# Patient Record
Sex: Female | Born: 1935 | Race: White | Hispanic: No | Smoking: Former smoker
Health system: Southern US, Community
[De-identification: ages and names within clinical notes are randomized; demographics above are authoritative.]

## PROBLEM LIST (undated history)

## (undated) DIAGNOSIS — Z7901 Long term (current) use of anticoagulants: Secondary | ICD-10-CM

## (undated) DIAGNOSIS — K5730 Diverticulosis of large intestine without perforation or abscess without bleeding: Secondary | ICD-10-CM

## (undated) DIAGNOSIS — K623 Rectal prolapse: Secondary | ICD-10-CM

## (undated) DIAGNOSIS — I4891 Unspecified atrial fibrillation: Secondary | ICD-10-CM

## (undated) DIAGNOSIS — K56609 Unspecified intestinal obstruction, unspecified as to partial versus complete obstruction: Secondary | ICD-10-CM

## (undated) DIAGNOSIS — G8929 Other chronic pain: Secondary | ICD-10-CM

## (undated) DIAGNOSIS — K5792 Diverticulitis of intestine, part unspecified, without perforation or abscess without bleeding: Secondary | ICD-10-CM

## (undated) DIAGNOSIS — R51 Headache: Secondary | ICD-10-CM

## (undated) DIAGNOSIS — F32A Depression, unspecified: Secondary | ICD-10-CM

## (undated) DIAGNOSIS — D500 Iron deficiency anemia secondary to blood loss (chronic): Secondary | ICD-10-CM

## (undated) DIAGNOSIS — K922 Gastrointestinal hemorrhage, unspecified: Secondary | ICD-10-CM

## (undated) DIAGNOSIS — R519 Headache, unspecified: Secondary | ICD-10-CM

## (undated) DIAGNOSIS — F329 Major depressive disorder, single episode, unspecified: Secondary | ICD-10-CM

## (undated) DIAGNOSIS — K8020 Calculus of gallbladder without cholecystitis without obstruction: Secondary | ICD-10-CM

## (undated) DIAGNOSIS — F419 Anxiety disorder, unspecified: Secondary | ICD-10-CM

## (undated) DIAGNOSIS — N390 Urinary tract infection, site not specified: Secondary | ICD-10-CM

## (undated) DIAGNOSIS — K589 Irritable bowel syndrome without diarrhea: Secondary | ICD-10-CM

## (undated) HISTORY — PX: ABDOMINAL HYSTERECTOMY: SHX81

## (undated) HISTORY — DX: Headache: R51

## (undated) HISTORY — DX: Calculus of gallbladder without cholecystitis without obstruction: K80.20

## (undated) HISTORY — DX: Major depressive disorder, single episode, unspecified: F32.9

## (undated) HISTORY — DX: Headache, unspecified: R51.9

## (undated) HISTORY — DX: Irritable bowel syndrome, unspecified: K58.9

## (undated) HISTORY — DX: Depression, unspecified: F32.A

## (undated) HISTORY — DX: Other chronic pain: G89.29

## (undated) HISTORY — PX: APPENDECTOMY: SHX54

## (undated) HISTORY — PX: OTHER SURGICAL HISTORY: SHX169

## (undated) HISTORY — DX: Unspecified intestinal obstruction, unspecified as to partial versus complete obstruction: K56.609

## (undated) HISTORY — DX: Urinary tract infection, site not specified: N39.0

## (undated) HISTORY — DX: Anxiety disorder, unspecified: F41.9

## (undated) HISTORY — PX: SMALL INTESTINE SURGERY: SHX150

---

## 1998-07-10 ENCOUNTER — Encounter: Payer: Self-pay | Admitting: Internal Medicine

## 1998-07-10 ENCOUNTER — Ambulatory Visit (HOSPITAL_COMMUNITY): Admission: RE | Admit: 1998-07-10 | Discharge: 1998-07-10 | Payer: Self-pay | Admitting: Internal Medicine

## 1998-09-30 ENCOUNTER — Other Ambulatory Visit: Admission: RE | Admit: 1998-09-30 | Discharge: 1998-09-30 | Payer: Self-pay | Admitting: *Deleted

## 1999-10-01 ENCOUNTER — Other Ambulatory Visit: Admission: RE | Admit: 1999-10-01 | Discharge: 1999-10-01 | Payer: Self-pay | Admitting: *Deleted

## 2000-02-17 ENCOUNTER — Encounter: Admission: RE | Admit: 2000-02-17 | Discharge: 2000-02-17 | Payer: Self-pay | Admitting: *Deleted

## 2000-02-17 ENCOUNTER — Encounter: Payer: Self-pay | Admitting: *Deleted

## 2000-02-24 ENCOUNTER — Encounter: Admission: RE | Admit: 2000-02-24 | Discharge: 2000-02-24 | Payer: Self-pay | Admitting: *Deleted

## 2000-02-24 ENCOUNTER — Encounter: Payer: Self-pay | Admitting: *Deleted

## 2000-10-06 ENCOUNTER — Other Ambulatory Visit: Admission: RE | Admit: 2000-10-06 | Discharge: 2000-10-06 | Payer: Self-pay | Admitting: *Deleted

## 2000-11-27 ENCOUNTER — Emergency Department (HOSPITAL_COMMUNITY): Admission: EM | Admit: 2000-11-27 | Discharge: 2000-11-27 | Payer: Self-pay | Admitting: Emergency Medicine

## 2000-11-27 ENCOUNTER — Encounter: Payer: Self-pay | Admitting: Emergency Medicine

## 2000-12-14 ENCOUNTER — Encounter: Payer: Self-pay | Admitting: General Surgery

## 2000-12-21 ENCOUNTER — Encounter (INDEPENDENT_AMBULATORY_CARE_PROVIDER_SITE_OTHER): Payer: Self-pay

## 2000-12-21 ENCOUNTER — Observation Stay (HOSPITAL_COMMUNITY): Admission: RE | Admit: 2000-12-21 | Discharge: 2000-12-22 | Payer: Self-pay | Admitting: General Surgery

## 2001-06-14 ENCOUNTER — Ambulatory Visit (HOSPITAL_COMMUNITY): Admission: RE | Admit: 2001-06-14 | Discharge: 2001-06-14 | Payer: Self-pay | Admitting: *Deleted

## 2001-09-24 ENCOUNTER — Encounter: Payer: Self-pay | Admitting: Emergency Medicine

## 2001-09-24 ENCOUNTER — Emergency Department (HOSPITAL_COMMUNITY): Admission: EM | Admit: 2001-09-24 | Discharge: 2001-09-24 | Payer: Self-pay | Admitting: Emergency Medicine

## 2002-03-05 ENCOUNTER — Emergency Department (HOSPITAL_COMMUNITY): Admission: EM | Admit: 2002-03-05 | Discharge: 2002-03-05 | Payer: Self-pay | Admitting: Orthopedic Surgery

## 2002-12-19 ENCOUNTER — Encounter: Admission: RE | Admit: 2002-12-19 | Discharge: 2002-12-19 | Payer: Self-pay | Admitting: Internal Medicine

## 2002-12-19 ENCOUNTER — Encounter: Payer: Self-pay | Admitting: Internal Medicine

## 2004-09-26 ENCOUNTER — Emergency Department (HOSPITAL_COMMUNITY): Admission: EM | Admit: 2004-09-26 | Discharge: 2004-09-26 | Payer: Self-pay | Admitting: Emergency Medicine

## 2005-12-12 ENCOUNTER — Emergency Department (HOSPITAL_COMMUNITY): Admission: EM | Admit: 2005-12-12 | Discharge: 2005-12-12 | Payer: Self-pay | Admitting: Emergency Medicine

## 2006-12-20 ENCOUNTER — Emergency Department (HOSPITAL_COMMUNITY): Admission: EM | Admit: 2006-12-20 | Discharge: 2006-12-20 | Payer: Self-pay | Admitting: Emergency Medicine

## 2007-04-17 ENCOUNTER — Emergency Department (HOSPITAL_COMMUNITY): Admission: EM | Admit: 2007-04-17 | Discharge: 2007-04-17 | Payer: Self-pay | Admitting: Emergency Medicine

## 2008-11-09 ENCOUNTER — Inpatient Hospital Stay (HOSPITAL_COMMUNITY): Admission: AD | Admit: 2008-11-09 | Discharge: 2008-11-12 | Payer: Self-pay | Admitting: Internal Medicine

## 2008-11-09 ENCOUNTER — Encounter: Admission: RE | Admit: 2008-11-09 | Discharge: 2008-11-09 | Payer: Self-pay | Admitting: Internal Medicine

## 2008-11-12 ENCOUNTER — Encounter (INDEPENDENT_AMBULATORY_CARE_PROVIDER_SITE_OTHER): Payer: Self-pay | Admitting: *Deleted

## 2008-12-20 ENCOUNTER — Encounter: Admission: RE | Admit: 2008-12-20 | Discharge: 2008-12-20 | Payer: Self-pay | Admitting: Surgery

## 2009-01-24 ENCOUNTER — Encounter: Admission: RE | Admit: 2009-01-24 | Discharge: 2009-01-24 | Payer: Self-pay | Admitting: Internal Medicine

## 2009-03-29 ENCOUNTER — Encounter (INDEPENDENT_AMBULATORY_CARE_PROVIDER_SITE_OTHER): Payer: Self-pay | Admitting: Surgery

## 2009-03-29 ENCOUNTER — Inpatient Hospital Stay (HOSPITAL_COMMUNITY): Admission: RE | Admit: 2009-03-29 | Discharge: 2009-04-09 | Payer: Self-pay | Admitting: Surgery

## 2009-04-01 ENCOUNTER — Encounter (INDEPENDENT_AMBULATORY_CARE_PROVIDER_SITE_OTHER): Payer: Self-pay | Admitting: Internal Medicine

## 2009-05-05 ENCOUNTER — Inpatient Hospital Stay (HOSPITAL_COMMUNITY): Admission: EM | Admit: 2009-05-05 | Discharge: 2009-05-13 | Payer: Self-pay | Admitting: Emergency Medicine

## 2009-06-03 ENCOUNTER — Inpatient Hospital Stay (HOSPITAL_COMMUNITY): Admission: EM | Admit: 2009-06-03 | Discharge: 2009-06-06 | Payer: Self-pay | Admitting: Emergency Medicine

## 2009-06-06 ENCOUNTER — Ambulatory Visit: Payer: Self-pay | Admitting: Internal Medicine

## 2009-11-09 ENCOUNTER — Emergency Department (HOSPITAL_COMMUNITY): Admission: EM | Admit: 2009-11-09 | Discharge: 2009-11-09 | Payer: Self-pay | Admitting: Family Medicine

## 2009-11-11 ENCOUNTER — Inpatient Hospital Stay (HOSPITAL_COMMUNITY): Admission: EM | Admit: 2009-11-11 | Discharge: 2009-11-15 | Payer: Self-pay | Admitting: Emergency Medicine

## 2010-02-21 IMAGING — CR DG ABDOMEN ACUTE W/ 1V CHEST
3 series · 3 of 3 positions shown · non-contrast
Comparison: 05/09/2009 and chest film of 05/12/2009

CLINICAL DATA: Nausea vomiting.  Abdominal pain.  Partial
colectomy.

ACUTE ABDOMEN SERIES (ABDOMEN 2 VIEW & CHEST 1 VIEW)

[w chest pa]
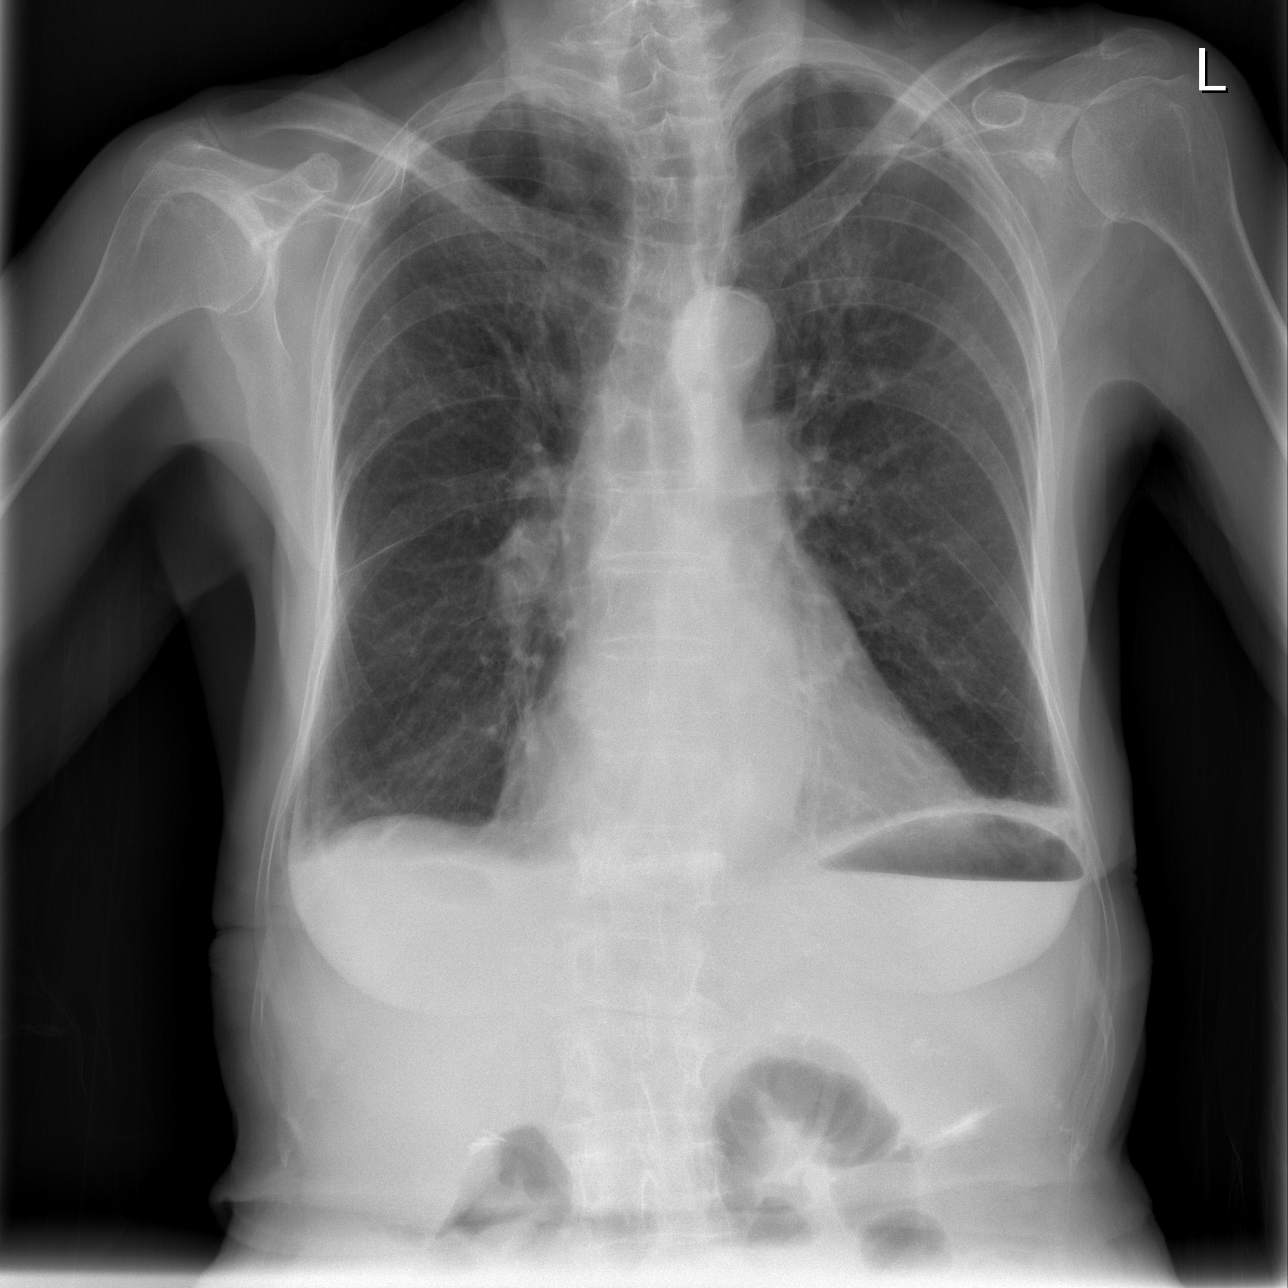

[w abdomen upright *]
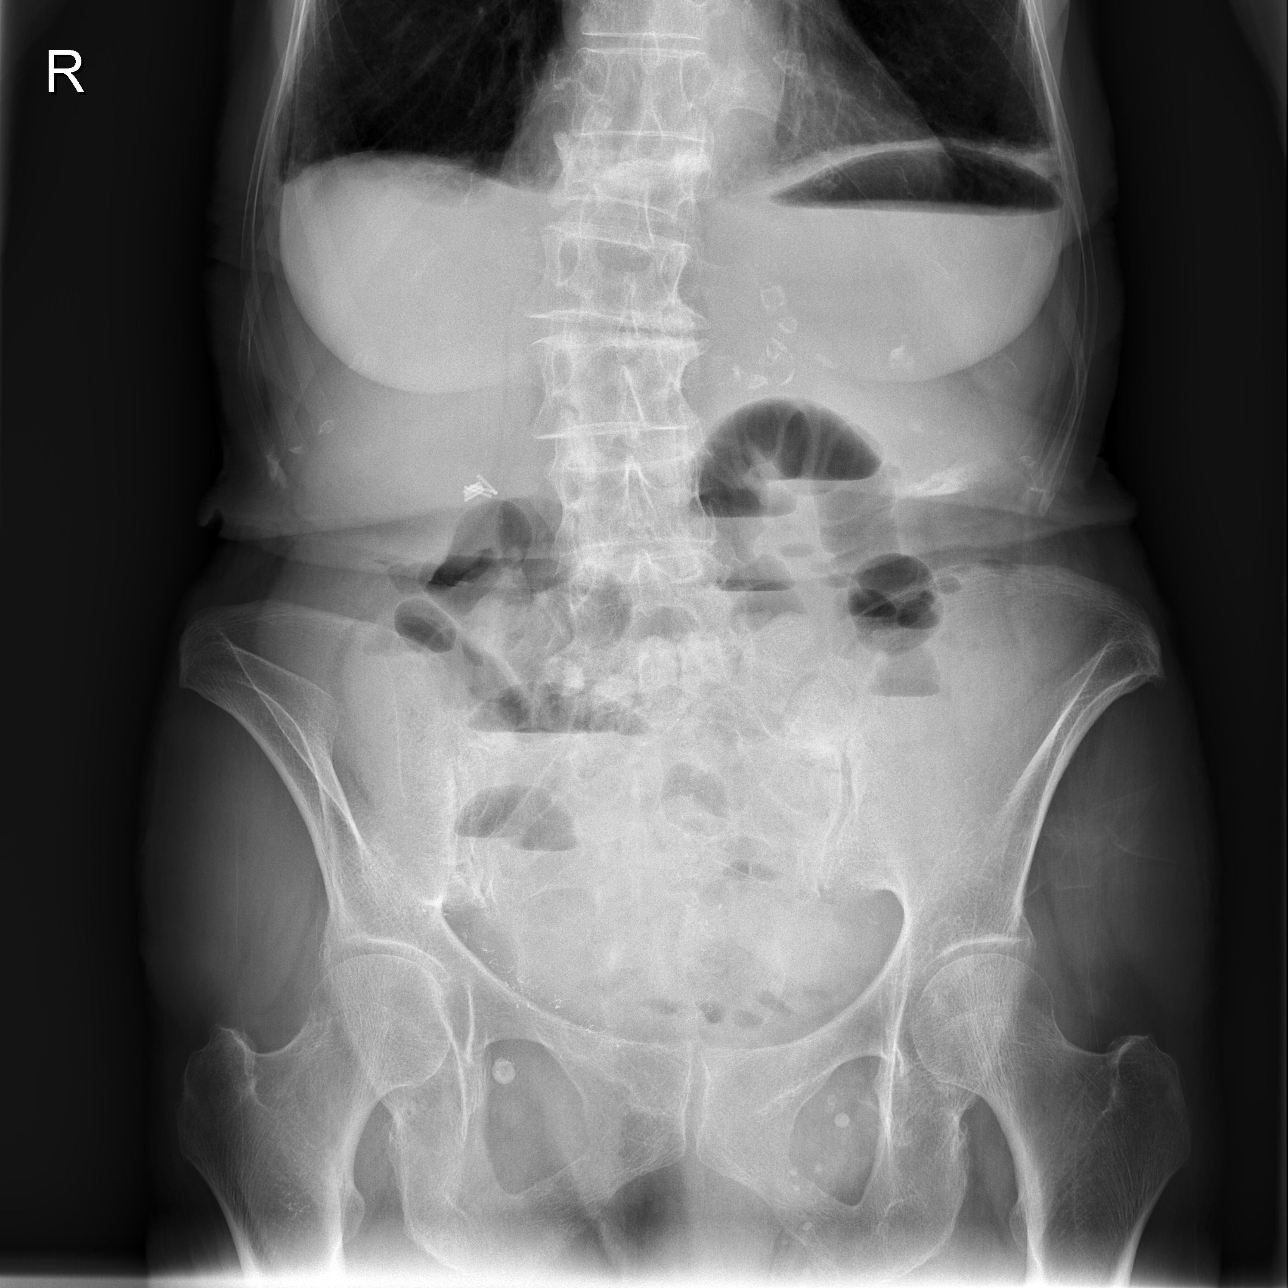

[t abdomen supine]
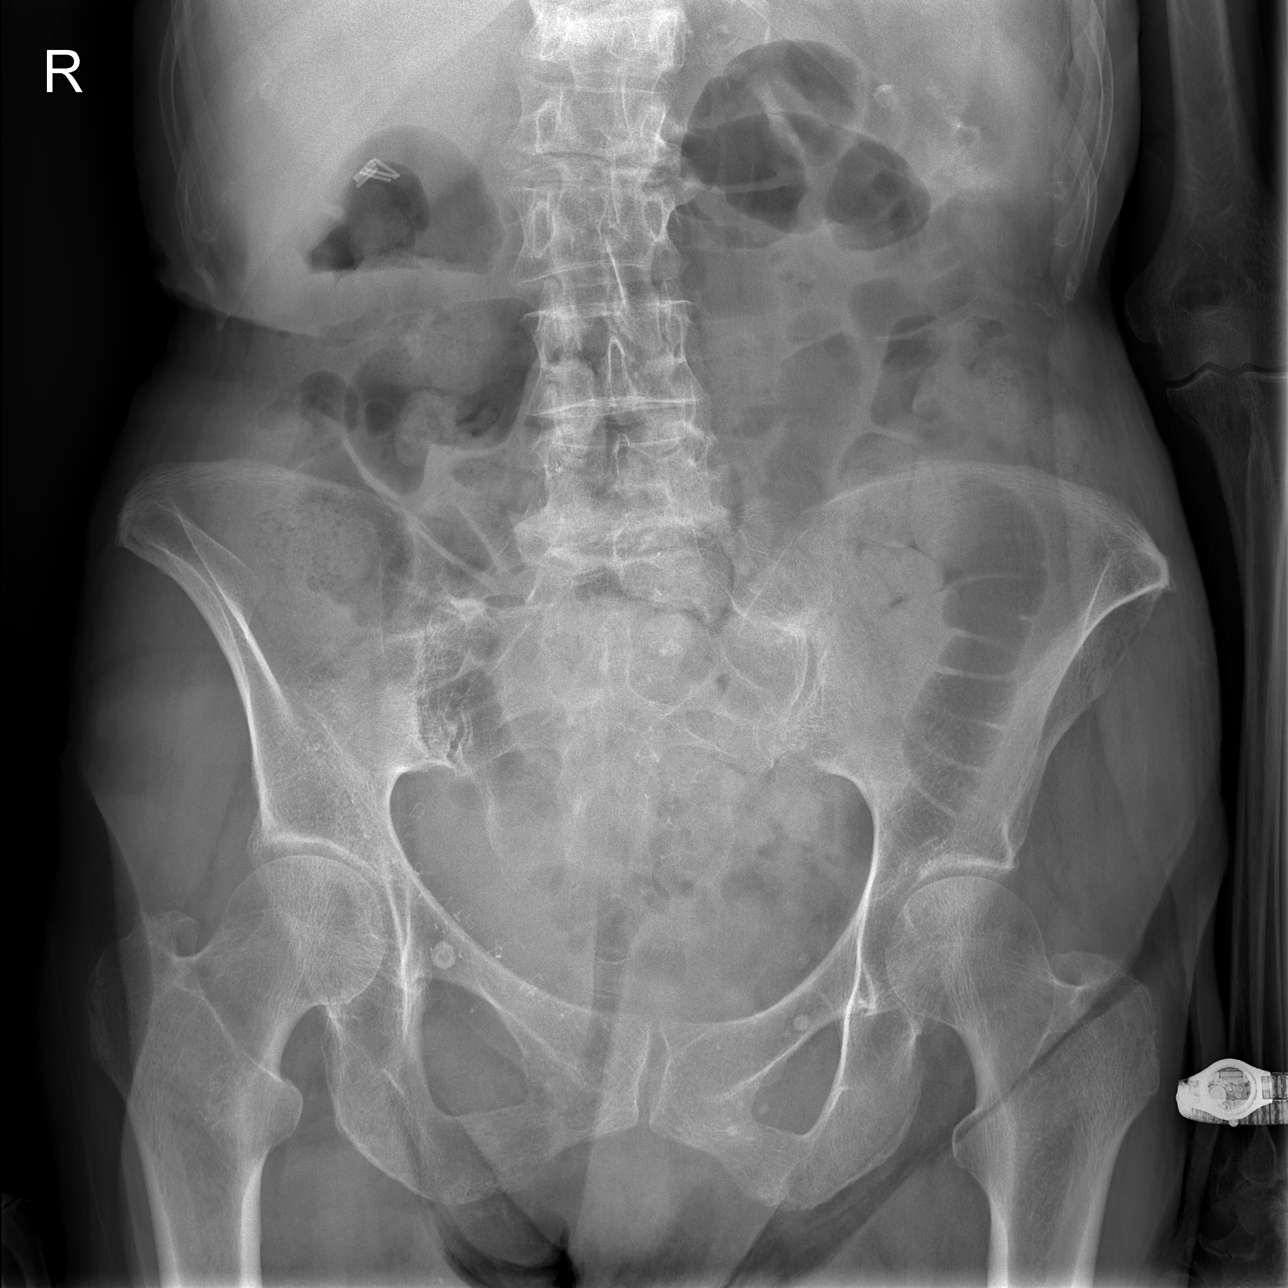

[3 of 3 positions shown; findings below may reference images not displayed]

FINDINGS: Frontal view of the chest demonstrates underlying COPD.
Moderate osteopenia. Midline trachea.  Mild cardiomegaly.  Small
bilateral pleural effusions which are decreased.  Biapical pleural
thickening without pneumothorax.  Improved aeration with mild
bibasilar atelectasis.  S-shaped spinal curvature.  Possible L1
compression deformity.  Suboptimally evaluated.

Abominal films demonstrate no free intraperitoneal air.  Advanced
vascular calcifications.  Numerous air-fluid levels within small
bowel loops.  Small bowel mildly dilated at approximately 3.5 cm.
Colonic gas and stool.  Probably a small amount of sigmoid gas.
IMPRESSION: 1.  Findings again suspicious for mild partial small bowel
obstruction.  Similar to 05/09/2009.
2.  Improved aeration with underlying COPD and small bilateral
pleural effusions remaining.
3.  Probable L1 compression deformity.

## 2010-10-22 LAB — POCT URINALYSIS DIP (DEVICE)
Ketones, ur: NEGATIVE mg/dL
Protein, ur: 100 mg/dL — AB
Specific Gravity, Urine: 1.015 (ref 1.005–1.030)
Urobilinogen, UA: 1 mg/dL (ref 0.0–1.0)
pH: 6 (ref 5.0–8.0)

## 2010-10-22 LAB — BASIC METABOLIC PANEL
BUN: 14 mg/dL (ref 6–23)
BUN: 16 mg/dL (ref 6–23)
CO2: 23 mEq/L (ref 19–32)
CO2: 24 mEq/L (ref 19–32)
CO2: 25 mEq/L (ref 19–32)
CO2: 27 mEq/L (ref 19–32)
Calcium: 8.2 mg/dL — ABNORMAL LOW (ref 8.4–10.5)
Chloride: 109 mEq/L (ref 96–112)
Chloride: 110 mEq/L (ref 96–112)
Creatinine, Ser: 0.6 mg/dL (ref 0.4–1.2)
GFR calc non Af Amer: >60 mL/min (ref >60)
Glucose, Bld: 100 mg/dL — ABNORMAL HIGH (ref 70–99)
Glucose, Bld: 120 mg/dL — ABNORMAL HIGH (ref 70–99)
Glucose, Bld: 68 mg/dL — ABNORMAL LOW (ref 70–99)
Glucose, Bld: 89 mg/dL (ref 70–99)
Potassium: 3.7 mEq/L (ref 3.5–5.1)
Potassium: 3.7 mEq/L (ref 3.5–5.1)
Potassium: 3.8 mEq/L (ref 3.5–5.1)
Sodium: 136 mEq/L (ref 135–145)
Sodium: 140 mEq/L (ref 135–145)

## 2010-10-22 LAB — DIFFERENTIAL
Basophils Absolute: 0 10*3/uL (ref 0.0–0.1)
Basophils Relative: 0 % (ref 0–1)
Basophils Relative: 0 % (ref 0–1)
Eosinophils Relative: 0 % (ref 0–5)
Lymphocytes Relative: 11 % — ABNORMAL LOW (ref 12–46)
Monocytes Absolute: 1 10*3/uL (ref 0.1–1.0)
Monocytes Relative: 6 % (ref 3–12)
Neutro Abs: 8.8 10*3/uL — ABNORMAL HIGH (ref 1.7–7.7)
Neutrophils Relative %: 92 % — ABNORMAL HIGH (ref 43–77)

## 2010-10-22 LAB — CBC
HCT: 30.5 % — ABNORMAL LOW (ref 36.0–46.0)
HCT: 31.3 % — ABNORMAL LOW (ref 36.0–46.0)
HCT: 32 % — ABNORMAL LOW (ref 36.0–46.0)
HCT: 32.9 % — ABNORMAL LOW (ref 36.0–46.0)
HCT: 34.2 % — ABNORMAL LOW (ref 36.0–46.0)
Hemoglobin: 10.3 g/dL — ABNORMAL LOW (ref 12.0–15.0)
Hemoglobin: 10.3 g/dL — ABNORMAL LOW (ref 12.0–15.0)
Hemoglobin: 11.1 g/dL — ABNORMAL LOW (ref 12.0–15.0)
Hemoglobin: 11.3 g/dL — ABNORMAL LOW (ref 12.0–15.0)
MCHC: 33.1 g/dL (ref 30.0–36.0)
MCHC: 33.3 g/dL (ref 30.0–36.0)
MCHC: 33.6 g/dL (ref 30.0–36.0)
MCHC: 33.7 g/dL (ref 30.0–36.0)
MCV: 95.3 fL (ref 78.0–100.0)
MCV: 95.9 fL (ref 78.0–100.0)
MCV: 96.2 fL (ref 78.0–100.0)
MCV: 96.8 fL (ref 78.0–100.0)
Platelets: 210 10*3/uL (ref 150–400)
Platelets: 231 10*3/uL (ref 150–400)
Platelets: 306 10*3/uL (ref 150–400)
RDW: 14.8 % (ref 11.5–15.5)
RDW: 14.9 % (ref 11.5–15.5)
RDW: 15.1 % (ref 11.5–15.5)
RDW: 15.3 % (ref 11.5–15.5)
RDW: 15.4 % (ref 11.5–15.5)
WBC: 4.9 10*3/uL (ref 4.0–10.5)

## 2010-10-22 LAB — COMPREHENSIVE METABOLIC PANEL
Alkaline Phosphatase: 74 U/L (ref 39–117)
BUN: 22 mg/dL (ref 6–23)
Calcium: 8.3 mg/dL — ABNORMAL LOW (ref 8.4–10.5)
Creatinine, Ser: 0.75 mg/dL (ref 0.4–1.2)
Glucose, Bld: 150 mg/dL — ABNORMAL HIGH (ref 70–99)
Potassium: 3.9 mEq/L (ref 3.5–5.1)
Total Protein: 6.7 g/dL (ref 6.0–8.3)

## 2010-10-22 LAB — HEMOGLOBIN A1C
Hgb A1c MFr Bld: 6.4 % — ABNORMAL HIGH (ref 4.6–6.1)
Mean Plasma Glucose: 137 mg/dL

## 2010-10-22 LAB — URINALYSIS, ROUTINE W REFLEX MICROSCOPIC
Glucose, UA: NEGATIVE mg/dL
Protein, ur: 30 mg/dL — AB
Urobilinogen, UA: 1 mg/dL (ref 0.0–1.0)

## 2010-10-22 LAB — RETICULOCYTES: Retic Count, Absolute: 43.7 10*3/uL (ref 19.0–186.0)

## 2010-10-22 LAB — FOLATE: Folate: >20 ng/mL

## 2010-10-22 LAB — IRON AND TIBC: UIBC: 224 ug/dL

## 2010-10-22 LAB — PROTIME-INR
INR: 2.5 — ABNORMAL HIGH (ref 0.00–1.49)
INR: 2.7 — ABNORMAL HIGH (ref 0.00–1.49)
Prothrombin Time: 26.8 seconds — ABNORMAL HIGH (ref 11.6–15.2)

## 2010-10-22 LAB — CULTURE, BLOOD (ROUTINE X 2): Culture: NO GROWTH

## 2010-10-22 LAB — POCT I-STAT, CHEM 8
Chloride: 103 mEq/L (ref 96–112)
HCT: 38 % (ref 36.0–46.0)
Hemoglobin: 12.9 g/dL (ref 12.0–15.0)
Potassium: 4.4 mEq/L (ref 3.5–5.1)
Sodium: 134 mEq/L — ABNORMAL LOW (ref 135–145)

## 2010-10-22 LAB — URINE CULTURE

## 2010-10-22 LAB — LACTIC ACID, PLASMA: Lactic Acid, Venous: 0.6 mmol/L (ref 0.5–2.2)

## 2010-10-22 LAB — URINE MICROSCOPIC-ADD ON

## 2010-11-05 LAB — BASIC METABOLIC PANEL
BUN: 6 mg/dL (ref 6–23)
CO2: 29 mEq/L (ref 19–32)
Calcium: 8.3 mg/dL — ABNORMAL LOW (ref 8.4–10.5)
Chloride: 102 mEq/L (ref 96–112)
Creatinine, Ser: 0.6 mg/dL (ref 0.4–1.2)
Glucose, Bld: 95 mg/dL (ref 70–99)

## 2010-11-05 LAB — CBC
MCHC: 33.9 g/dL (ref 30.0–36.0)
MCV: 95.8 fL (ref 78.0–100.0)
Platelets: 207 10*3/uL (ref 150–400)
RBC: 3.37 MIL/uL — ABNORMAL LOW (ref 3.87–5.11)
RDW: 14.2 % (ref 11.5–15.5)

## 2010-11-05 LAB — URINE CULTURE
Colony Count: >=100000
Special Requests: NEGATIVE

## 2010-11-06 LAB — COMPREHENSIVE METABOLIC PANEL
ALT: 20 U/L (ref 0–35)
AST: 23 U/L (ref 0–37)
AST: 20 U/L (ref 0–37)
Albumin: 3.7 g/dL (ref 3.5–5.2)
Albumin: 3.2 g/dL — ABNORMAL LOW (ref 3.5–5.2)
Alkaline Phosphatase: 59 U/L (ref 39–117)
BUN: 18 mg/dL (ref 6–23)
CO2: 23 mEq/L (ref 19–32)
CO2: 24 mEq/L (ref 19–32)
CO2: 24 mEq/L (ref 19–32)
Calcium: 8.8 mg/dL (ref 8.4–10.5)
Calcium: 8.1 mg/dL — ABNORMAL LOW (ref 8.4–10.5)
Calcium: 9.1 mg/dL (ref 8.4–10.5)
Chloride: 107 mEq/L (ref 96–112)
Creatinine, Ser: 0.73 mg/dL (ref 0.4–1.2)
Creatinine, Ser: 0.57 mg/dL (ref 0.4–1.2)
Creatinine, Ser: 0.66 mg/dL (ref 0.4–1.2)
GFR calc Af Amer: >60 mL/min (ref >60)
GFR calc Af Amer: >60 mL/min (ref >60)
GFR calc non Af Amer: >60 mL/min (ref >60)
GFR calc non Af Amer: >60 mL/min (ref >60)
GFR calc non Af Amer: >60 mL/min (ref >60)
Glucose, Bld: 105 mg/dL — ABNORMAL HIGH (ref 70–99)
Glucose, Bld: 75 mg/dL (ref 70–99)
Potassium: 3.4 mEq/L — ABNORMAL LOW (ref 3.5–5.1)
Sodium: 133 mEq/L — ABNORMAL LOW (ref 135–145)
Total Bilirubin: 1.1 mg/dL (ref 0.3–1.2)
Total Protein: 5.7 g/dL — ABNORMAL LOW (ref 6.0–8.3)

## 2010-11-06 LAB — URINE MICROSCOPIC-ADD ON

## 2010-11-06 LAB — PREPARE FRESH FROZEN PLASMA

## 2010-11-06 LAB — CBC
HCT: 31.2 % — ABNORMAL LOW (ref 36.0–46.0)
HCT: 32.2 % — ABNORMAL LOW (ref 36.0–46.0)
Hemoglobin: 10.6 g/dL — ABNORMAL LOW (ref 12.0–15.0)
Hemoglobin: 10.6 g/dL — ABNORMAL LOW (ref 12.0–15.0)
Hemoglobin: 10.9 g/dL — ABNORMAL LOW (ref 12.0–15.0)
Hemoglobin: 11 g/dL — ABNORMAL LOW (ref 12.0–15.0)
MCHC: 33 g/dL (ref 30.0–36.0)
MCHC: 33.9 g/dL (ref 30.0–36.0)
MCHC: 34 g/dL (ref 30.0–36.0)
MCHC: 34.8 g/dL (ref 30.0–36.0)
MCV: 96.3 fL (ref 78.0–100.0)
MCV: 95.4 fL (ref 78.0–100.0)
MCV: 95.6 fL (ref 78.0–100.0)
MCV: 96.4 fL (ref 78.0–100.0)
Platelets: 253 10*3/uL (ref 150–400)
RBC: 3.19 MIL/uL — ABNORMAL LOW (ref 3.87–5.11)
RBC: 3.26 MIL/uL — ABNORMAL LOW (ref 3.87–5.11)
RBC: 3.34 MIL/uL — ABNORMAL LOW (ref 3.87–5.11)
RBC: 3.83 MIL/uL — ABNORMAL LOW (ref 3.87–5.11)
RDW: 13.6 % (ref 11.5–15.5)
RDW: 14.4 % (ref 11.5–15.5)
WBC: 8.9 10*3/uL (ref 4.0–10.5)
WBC: 4.7 10*3/uL (ref 4.0–10.5)
WBC: 5.7 10*3/uL (ref 4.0–10.5)
WBC: 5.9 10*3/uL (ref 4.0–10.5)
WBC: 8.3 10*3/uL (ref 4.0–10.5)

## 2010-11-06 LAB — DIFFERENTIAL
Basophils Absolute: 0 10*3/uL (ref 0.0–0.1)
Basophils Absolute: 0 10*3/uL (ref 0.0–0.1)
Basophils Relative: 0 % (ref 0–1)
Basophils Relative: 0 % (ref 0–1)
Eosinophils Absolute: 0.2 10*3/uL (ref 0.0–0.7)
Eosinophils Relative: 1 % (ref 0–5)
Lymphocytes Relative: 10 % — ABNORMAL LOW (ref 12–46)
Lymphocytes Relative: 12 % (ref 12–46)
Lymphocytes Relative: 7 % — ABNORMAL LOW (ref 12–46)
Lymphs Abs: 0.9 10*3/uL (ref 0.7–4.0)
Lymphs Abs: 0.5 10*3/uL — ABNORMAL LOW (ref 0.7–4.0)
Lymphs Abs: 0.7 10*3/uL (ref 0.7–4.0)
Monocytes Absolute: 0.5 10*3/uL (ref 0.1–1.0)
Monocytes Absolute: 0.5 10*3/uL (ref 0.1–1.0)
Monocytes Relative: 6 % (ref 3–12)
Monocytes Relative: 8 % (ref 3–12)
Neutro Abs: 3.6 10*3/uL (ref 1.7–7.7)
Neutro Abs: 5.2 10*3/uL (ref 1.7–7.7)
Neutrophils Relative %: 80 % — ABNORMAL HIGH (ref 43–77)
Neutrophils Relative %: 82 % — ABNORMAL HIGH (ref 43–77)

## 2010-11-06 LAB — BASIC METABOLIC PANEL
CO2: 26 mEq/L (ref 19–32)
Calcium: 8.1 mg/dL — ABNORMAL LOW (ref 8.4–10.5)
Chloride: 100 mEq/L (ref 96–112)
Chloride: 104 mEq/L (ref 96–112)
Creatinine, Ser: 0.44 mg/dL (ref 0.4–1.2)
Creatinine, Ser: 0.64 mg/dL (ref 0.4–1.2)
GFR calc Af Amer: >60 mL/min (ref >60)
GFR calc Af Amer: >60 mL/min (ref >60)
GFR calc Af Amer: >60 mL/min (ref >60)
GFR calc non Af Amer: >60 mL/min (ref >60)
Glucose, Bld: 97 mg/dL (ref 70–99)
Potassium: 3.6 mEq/L (ref 3.5–5.1)
Potassium: 3.7 mEq/L (ref 3.5–5.1)
Sodium: 135 mEq/L (ref 135–145)
Sodium: 137 mEq/L (ref 135–145)

## 2010-11-06 LAB — POCT I-STAT, CHEM 8
Calcium, Ion: 1.12 mmol/L (ref 1.12–1.32)
Glucose, Bld: 137 mg/dL — ABNORMAL HIGH (ref 70–99)
HCT: 37 % (ref 36.0–46.0)
Hemoglobin: 12.6 g/dL (ref 12.0–15.0)
TCO2: 24 mmol/L (ref 0–100)

## 2010-11-06 LAB — CROSSMATCH
ABO/RH(D): O NEG
Antibody Screen: NEGATIVE

## 2010-11-06 LAB — APTT: aPTT: 27 seconds (ref 24–37)

## 2010-11-06 LAB — URINALYSIS, ROUTINE W REFLEX MICROSCOPIC
Bilirubin Urine: NEGATIVE
Glucose, UA: NEGATIVE mg/dL
Nitrite: POSITIVE — AB
Protein, ur: NEGATIVE mg/dL
Specific Gravity, Urine: 1.024 (ref 1.005–1.030)
Urobilinogen, UA: 1 mg/dL (ref 0.0–1.0)
pH: 6 (ref 5.0–8.0)

## 2010-11-06 LAB — MAGNESIUM
Magnesium: 1.9 mg/dL (ref 1.5–2.5)
Magnesium: 2 mg/dL (ref 1.5–2.5)
Magnesium: 2 mg/dL (ref 1.5–2.5)

## 2010-11-06 LAB — PHOSPHORUS
Phosphorus: 2.1 mg/dL — ABNORMAL LOW (ref 2.3–4.6)
Phosphorus: 3.1 mg/dL (ref 2.3–4.6)

## 2010-11-06 LAB — PROTIME-INR
INR: 2.4 — ABNORMAL HIGH (ref 0.00–1.49)
Prothrombin Time: 26.2 seconds — ABNORMAL HIGH (ref 11.6–15.2)

## 2010-11-06 LAB — HEMOGLOBIN AND HEMATOCRIT, BLOOD
HCT: 31.8 % — ABNORMAL LOW (ref 36.0–46.0)
HCT: 32.4 % — ABNORMAL LOW (ref 36.0–46.0)
Hemoglobin: 10.5 g/dL — ABNORMAL LOW (ref 12.0–15.0)
Hemoglobin: 10.9 g/dL — ABNORMAL LOW (ref 12.0–15.0)

## 2010-11-06 LAB — URINE CULTURE: Colony Count: >=100000

## 2010-11-06 LAB — ABO/RH: ABO/RH(D): O NEG

## 2010-11-07 LAB — CBC
HCT: 30.5 % — ABNORMAL LOW (ref 36.0–46.0)
HCT: 31.5 % — ABNORMAL LOW (ref 36.0–46.0)
HCT: 37.5 % (ref 36.0–46.0)
Hemoglobin: 10.9 g/dL — ABNORMAL LOW (ref 12.0–15.0)
Hemoglobin: 11.5 g/dL — ABNORMAL LOW (ref 12.0–15.0)
Hemoglobin: 11.9 g/dL — ABNORMAL LOW (ref 12.0–15.0)
Hemoglobin: 12.4 g/dL (ref 12.0–15.0)
Hemoglobin: 12.8 g/dL (ref 12.0–15.0)
MCV: 94.1 fL (ref 78.0–100.0)
MCV: 95.1 fL (ref 78.0–100.0)
Platelets: 275 10*3/uL (ref 150–400)
Platelets: 305 10*3/uL (ref 150–400)
Platelets: 329 10*3/uL (ref 150–400)
RBC: 3.24 MIL/uL — ABNORMAL LOW (ref 3.87–5.11)
RBC: 3.32 MIL/uL — ABNORMAL LOW (ref 3.87–5.11)
RBC: 3.81 MIL/uL — ABNORMAL LOW (ref 3.87–5.11)
RDW: 13.2 % (ref 11.5–15.5)
RDW: 13.3 % (ref 11.5–15.5)
RDW: 13.5 % (ref 11.5–15.5)
RDW: 13.6 % (ref 11.5–15.5)
WBC: 6.3 10*3/uL (ref 4.0–10.5)
WBC: 6.8 10*3/uL (ref 4.0–10.5)
WBC: 7.2 10*3/uL (ref 4.0–10.5)
WBC: 7.8 10*3/uL (ref 4.0–10.5)

## 2010-11-07 LAB — BRAIN NATRIURETIC PEPTIDE: Pro B Natriuretic peptide (BNP): 598 pg/mL — ABNORMAL HIGH (ref 0.0–100.0)

## 2010-11-07 LAB — BASIC METABOLIC PANEL
BUN: 10 mg/dL (ref 6–23)
BUN: 6 mg/dL (ref 6–23)
Calcium: 8.3 mg/dL — ABNORMAL LOW (ref 8.4–10.5)
Calcium: 8.4 mg/dL (ref 8.4–10.5)
Calcium: 8.5 mg/dL (ref 8.4–10.5)
Chloride: 103 mEq/L (ref 96–112)
Creatinine, Ser: 0.85 mg/dL (ref 0.4–1.2)
GFR calc Af Amer: >60 mL/min (ref >60)
GFR calc Af Amer: >60 mL/min (ref >60)
GFR calc non Af Amer: >60 mL/min (ref >60)
GFR calc non Af Amer: >60 mL/min (ref >60)
GFR calc non Af Amer: >60 mL/min (ref >60)
GFR calc non Af Amer: >60 mL/min (ref >60)
Glucose, Bld: 136 mg/dL — ABNORMAL HIGH (ref 70–99)
Glucose, Bld: 79 mg/dL (ref 70–99)
Glucose, Bld: 86 mg/dL (ref 70–99)
Potassium: 3.7 mEq/L (ref 3.5–5.1)
Potassium: 4.3 mEq/L (ref 3.5–5.1)
Sodium: 131 mEq/L — ABNORMAL LOW (ref 135–145)
Sodium: 134 mEq/L — ABNORMAL LOW (ref 135–145)
Sodium: 136 mEq/L (ref 135–145)

## 2010-11-07 LAB — COMPREHENSIVE METABOLIC PANEL
ALT: 24 U/L (ref 0–35)
AST: 25 U/L (ref 0–37)
AST: 30 U/L (ref 0–37)
Albumin: 3.1 g/dL — ABNORMAL LOW (ref 3.5–5.2)
Alkaline Phosphatase: 57 U/L (ref 39–117)
BUN: 5 mg/dL — ABNORMAL LOW (ref 6–23)
CO2: 22 mEq/L (ref 19–32)
Chloride: 101 mEq/L (ref 96–112)
Creatinine, Ser: 0.55 mg/dL (ref 0.4–1.2)
GFR calc Af Amer: >60 mL/min (ref >60)
GFR calc Af Amer: >60 mL/min (ref >60)
GFR calc non Af Amer: >60 mL/min (ref >60)
Glucose, Bld: 104 mg/dL — ABNORMAL HIGH (ref 70–99)
Potassium: 4 mEq/L (ref 3.5–5.1)
Sodium: 130 mEq/L — ABNORMAL LOW (ref 135–145)
Total Bilirubin: 0.4 mg/dL (ref 0.3–1.2)
Total Protein: 5.4 g/dL — ABNORMAL LOW (ref 6.0–8.3)

## 2010-11-07 LAB — CLOSTRIDIUM DIFFICILE EIA: C difficile Toxins A+B, EIA: NEGATIVE

## 2010-11-07 LAB — FECAL LACTOFERRIN, QUANT: Fecal Lactoferrin: POSITIVE

## 2010-11-07 LAB — PROTIME-INR
INR: 2.3 — ABNORMAL HIGH (ref 0.00–1.49)
INR: 2.6 — ABNORMAL HIGH (ref 0.00–1.49)
INR: 2.7 — ABNORMAL HIGH (ref 0.00–1.49)
Prothrombin Time: 17.9 seconds — ABNORMAL HIGH (ref 11.6–15.2)
Prothrombin Time: 24.8 seconds — ABNORMAL HIGH (ref 11.6–15.2)
Prothrombin Time: 26.9 seconds — ABNORMAL HIGH (ref 11.6–15.2)
Prothrombin Time: 28.4 seconds — ABNORMAL HIGH (ref 11.6–15.2)

## 2010-11-07 LAB — STOOL CULTURE

## 2010-11-07 LAB — CARDIAC PANEL(CRET KIN+CKTOT+MB+TROPI)
CK, MB: 2.5 ng/mL (ref 0.3–4.0)
Relative Index: INVALID (ref 0.0–2.5)
Total CK: 39 U/L (ref 7–177)

## 2010-11-07 LAB — HEPARIN LEVEL (UNFRACTIONATED): Heparin Unfractionated: 0.38 IU/mL (ref 0.30–0.70)

## 2010-11-08 LAB — BASIC METABOLIC PANEL
BUN: 5 mg/dL — ABNORMAL LOW (ref 6–23)
BUN: 6 mg/dL (ref 6–23)
Chloride: 104 mEq/L (ref 96–112)
Chloride: 110 mEq/L (ref 96–112)
Creatinine, Ser: 0.57 mg/dL (ref 0.4–1.2)
Creatinine, Ser: 0.62 mg/dL (ref 0.4–1.2)
GFR calc Af Amer: >60 mL/min (ref >60)
GFR calc Af Amer: >60 mL/min (ref >60)
GFR calc non Af Amer: >60 mL/min (ref >60)
GFR calc non Af Amer: >60 mL/min (ref >60)
GFR calc non Af Amer: >60 mL/min (ref >60)
Potassium: 3.4 mEq/L — ABNORMAL LOW (ref 3.5–5.1)
Potassium: 3.5 mEq/L (ref 3.5–5.1)
Sodium: 134 mEq/L — ABNORMAL LOW (ref 135–145)

## 2010-11-08 LAB — CBC
HCT: 32 % — ABNORMAL LOW (ref 36.0–46.0)
HCT: 32.8 % — ABNORMAL LOW (ref 36.0–46.0)
HCT: 34.3 % — ABNORMAL LOW (ref 36.0–46.0)
HCT: 34.4 % — ABNORMAL LOW (ref 36.0–46.0)
HCT: 35.4 % — ABNORMAL LOW (ref 36.0–46.0)
Hemoglobin: 11 g/dL — ABNORMAL LOW (ref 12.0–15.0)
Hemoglobin: 11.1 g/dL — ABNORMAL LOW (ref 12.0–15.0)
Hemoglobin: 11.7 g/dL — ABNORMAL LOW (ref 12.0–15.0)
Hemoglobin: 12.1 g/dL (ref 12.0–15.0)
MCHC: 34 g/dL (ref 30.0–36.0)
MCHC: 34 g/dL (ref 30.0–36.0)
MCHC: 34.3 g/dL (ref 30.0–36.0)
MCHC: 34.4 g/dL (ref 30.0–36.0)
MCV: 94.4 fL (ref 78.0–100.0)
MCV: 94.7 fL (ref 78.0–100.0)
MCV: 94.9 fL (ref 78.0–100.0)
Platelets: 185 10*3/uL (ref 150–400)
Platelets: 207 10*3/uL (ref 150–400)
Platelets: 226 K/uL (ref 150–400)
RBC: 3.39 MIL/uL — ABNORMAL LOW (ref 3.87–5.11)
RBC: 3.74 MIL/uL — ABNORMAL LOW (ref 3.87–5.11)
RDW: 12.9 % (ref 11.5–15.5)
RDW: 13.3 % (ref 11.5–15.5)
WBC: 5.3 K/uL (ref 4.0–10.5)
WBC: 6.5 10*3/uL (ref 4.0–10.5)

## 2010-11-08 LAB — BASIC METABOLIC PANEL WITH GFR
BUN: 4 mg/dL — ABNORMAL LOW (ref 6–23)
BUN: 7 mg/dL (ref 6–23)
CO2: 24 meq/L (ref 19–32)
CO2: 25 meq/L (ref 19–32)
Calcium: 8.1 mg/dL — ABNORMAL LOW (ref 8.4–10.5)
Calcium: 8.5 mg/dL (ref 8.4–10.5)
Chloride: 104 meq/L (ref 96–112)
Chloride: 108 meq/L (ref 96–112)
Creatinine, Ser: 0.55 mg/dL (ref 0.4–1.2)
Creatinine, Ser: 0.56 mg/dL (ref 0.4–1.2)
GFR calc non Af Amer: >60 mL/min
GFR calc non Af Amer: >60 mL/min
Glucose, Bld: 119 mg/dL — ABNORMAL HIGH (ref 70–99)
Glucose, Bld: 180 mg/dL — ABNORMAL HIGH (ref 70–99)
Potassium: 4 meq/L (ref 3.5–5.1)
Potassium: 4.3 meq/L (ref 3.5–5.1)
Sodium: 134 meq/L — ABNORMAL LOW (ref 135–145)
Sodium: 137 meq/L (ref 135–145)

## 2010-11-08 LAB — CARDIAC PANEL(CRET KIN+CKTOT+MB+TROPI)
CK, MB: 2.1 ng/mL (ref 0.3–4.0)
CK, MB: 2.4 ng/mL (ref 0.3–4.0)
Relative Index: INVALID (ref 0.0–2.5)
Relative Index: INVALID (ref 0.0–2.5)
Total CK: 122 U/L (ref 7–177)
Total CK: 58 U/L (ref 7–177)
Troponin I: 0.02 ng/mL (ref 0.00–0.06)
Troponin I: 0.02 ng/mL (ref 0.00–0.06)

## 2010-11-08 LAB — TSH: TSH: 0.577 u[IU]/mL (ref 0.350–4.500)

## 2010-11-08 LAB — GLUCOSE, CAPILLARY: Glucose-Capillary: 117 mg/dL — ABNORMAL HIGH (ref 70–99)

## 2010-11-08 LAB — HEPARIN LEVEL (UNFRACTIONATED): Heparin Unfractionated: 0.51 IU/mL (ref 0.30–0.70)

## 2010-11-12 LAB — COMPREHENSIVE METABOLIC PANEL
ALT: 19 U/L (ref 0–35)
AST: 20 U/L (ref 0–37)
AST: 27 U/L (ref 0–37)
Albumin: 2.9 g/dL — ABNORMAL LOW (ref 3.5–5.2)
Albumin: 3.2 g/dL — ABNORMAL LOW (ref 3.5–5.2)
Alkaline Phosphatase: 62 U/L (ref 39–117)
BUN: 4 mg/dL — ABNORMAL LOW (ref 6–23)
Calcium: 8.4 mg/dL (ref 8.4–10.5)
Creatinine, Ser: 0.68 mg/dL (ref 0.4–1.2)
GFR calc Af Amer: >60 mL/min (ref >60)
GFR calc Af Amer: >60 mL/min (ref >60)
GFR calc non Af Amer: >60 mL/min (ref >60)
Glucose, Bld: 109 mg/dL — ABNORMAL HIGH (ref 70–99)
Potassium: 4 mEq/L (ref 3.5–5.1)
Sodium: 141 mEq/L (ref 135–145)
Total Protein: 5.4 g/dL — ABNORMAL LOW (ref 6.0–8.3)

## 2010-11-12 LAB — AMYLASE: Amylase: 60 U/L (ref 27–131)

## 2010-11-12 LAB — DIFFERENTIAL
Basophils Relative: 0 % (ref 0–1)
Eosinophils Absolute: 0.2 10*3/uL (ref 0.0–0.7)
Eosinophils Relative: 4 % (ref 0–5)
Lymphs Abs: 1 10*3/uL (ref 0.7–4.0)
Monocytes Absolute: 0.6 10*3/uL (ref 0.1–1.0)
Monocytes Relative: 9 % (ref 3–12)

## 2010-11-12 LAB — CBC
HCT: 30.3 % — ABNORMAL LOW (ref 36.0–46.0)
Hemoglobin: 9.9 g/dL — ABNORMAL LOW (ref 12.0–15.0)
MCHC: 33.8 g/dL (ref 30.0–36.0)
MCV: 95 fL (ref 78.0–100.0)
Platelets: 243 10*3/uL (ref 150–400)
Platelets: 259 10*3/uL (ref 150–400)
RDW: 13.4 % (ref 11.5–15.5)

## 2010-12-16 NOTE — Op Note (Signed)
Emily Wyatt, Emily Wyatt                ACCOUNT NO.:  [id number]   MEDICAL RECORD NO.:  [id number]          PATIENT TYPE:  INP   LOCATION:  1343                         FACILITY:  [facility]   PHYSICIAN:  Georgiana Spinner, M.D.    DATE OF BIRTH:  12/20/1926   DATE OF PROCEDURE:  11/12/2008  DATE OF DISCHARGE:                               OPERATIVE REPORT   PROCEDURE:  Colonoscopy.   INDICATIONS:  Rectal bleeding and rectal prolapse.   ANESTHESIA:  Fentanyl 100 mcg, Versed 6 mg.   PROCEDURE:  With the patient mildly sedated in the left lateral  decubitus position, the Pentax videoscopic pediatric colonoscope was  inserted in the rectum and passed under direct vision with pressure  applied.  The patient rolled to her back, to the left side, back to her  back and the left side again.  We were subsequently able to reach the  cecum.  It was identified by ileocecal valve and base of the cecum.  From this point, the colonoscope was slowly withdrawn, taking  circumferential views of colonic mucosa stopping to suction liquid  debris as we reached all the way to approximately 25-30 cm from anal  verge at which point we found 2 ulcerated areas which I presumed was  prolapsed tissue that I photographed and biopsies taken.  We then  withdrew further to the rectum where tissue was reddened, and it too was  photographed and biopsied.  I did not attempt retroflexed view because  the tissue in this area was compressed, and the lumen was widely patent  from what appeared to be prolapsed tissue by history.  The endoscope was  withdrawn.  The patient's vital signs and pulse oximeter remained  stable.  The patient tolerated the procedure well without apparent  complication.   FINDINGS:  Scattered diverticula seen throughout the colon with 2 ulcers  seen at 25 and 30 cm from anal verge, inflamed tissue, reddened mucosa  distally.  Await biopsy reports and follow clinically.  If the prolapse  becomes more of an  issue, then surgical evaluation may be necessary.  Of  note, the tissue was reduced on my examination, and there was no  prolapse on rectal exam.           ______________________________  Georgiana Spinner, M.D.     GMO/MEDQ  D:  11/12/2008  T:  11/12/2008  Job:  [id number]

## 2010-12-16 NOTE — Op Note (Signed)
NAMEMIN, TUNNELL                ACCOUNT NO.:  [id number]   MEDICAL RECORD NO.:  [id number]          PATIENT TYPE:  INP   LOCATION:  0005                         FACILITY:  [facility]   PHYSICIAN:  Thornton Park. Daphine Deutscher, MD  DATE OF BIRTH:  [birth year]   DATE OF PROCEDURE:  03/29/2009  DATE OF DISCHARGE:                               OPERATIVE REPORT   PREOPERATIVE INDICATIONS:  This is a 77 year old lady who has recently  been having more straining at stools and has developed significant  rectal prolapse.  She has also had multiple lower abdominal operations  for hernias.  Preoperatively, she was noted to have old diverticulitis  and diverticulosis, particularly in the sigmoid.   PROCEDURE:  Laparoscopically-assisted sigmoid colectomy with rectopexy  to the sacrum, sigmoidoscopic insufflation to check the anastomosis.  Hand-sewn, two-layer Roddie Mc type anastomosis.   SURGEON:  Luretha Murphy, MD.   ASSISTANT:  Gaynelle Adu, MD.   ANESTHESIA:  General endotracheal.   DESCRIPTION OF PROCEDURE:  Lexxus Underhill is a 77 year old lady who was  taken to OR 1 at [facility] on Friday, March 29, 2009, and  given general anesthesia.  She was placed in the yellow fin stirrups and  the entire abdomen and perineum were prepped with a Techni-Care  equivalent and draped sterilely.  The abdomen was entered through the  right upper quadrant using a 5-mm 0 degree Optiview without difficulty  and insufflated.  I was able to put two more trocars in, one in the  right lower quadrant and one in the lower midline where I would likely  be making an incision.  I found the sigmoid which looked to be pretty  redundant and on the left side down in the inguinal region, where her  previous inguinal hernia repair, she had stuck the colon up into that  and it was actually kind of herniated and it looked like it could  possibly be partially obstructed.  I carefully took that down, as it was  really stuck,  and used I used a Harmonic and mobilized that.  I then  took down the left colon and mobilized it toward the midline and she had  a fairly redundant colon and splenic flexure.  I went down on the pelvis  and found her to have again very redundant sigmoid and it was difficult  because of her small bowel and her right colon kept going down there.  I  went ahead and then made a lower midline incision and used the Alexis  wound retractor.  I went ahead and mobilized and brought up the rectum  and then put my hand down there and could feel this big muscular defect.  I elected to pull up the rectum and pex it with two sutures of 2-0  Prolene to the presacral fascia and ultimately I put some Tisseel down  there.  I resected the proximal colon going across proximally and  distally with a contour stapler into the mesentery with Kelly clamps and  2-0 silk ties.  This removed not only the pathology of the  diverticulosis but  the redundancy.  I then did a hand-sewn, two-layer  anastomosis in the fashion of Roddie Mc, placing a back row of 3-0  silk, a side-to-end, then opening along the taenia of the proximal  segment of colon and doing an inner layer of 4-0 PDS running locking and  then a canal fashion anteriorly.  A second layer of suture of silk 3-0  were used.  I then pressure tested this with insufflating with the  sigmoidoscope under water and no leaks were seen.   The area was irrigated.  No other abnormalities were noted.  The wound  was closed in two layers with 2-0 Vicryl and with interrupted #1 Novofil  pop offs with staples on the skin.  The patient tolerated the procedure  well and was taken to the recovery room.      Thornton Park Daphine Deutscher, MD  Electronically Signed     MBM/MEDQ  D:  03/29/2009  T:  03/29/2009  Job:  [id number]   cc:   Massie Maroon, MD  Fax: [phone]

## 2010-12-16 NOTE — Consult Note (Signed)
Emily Wyatt, Emily Wyatt                ACCOUNT NO.:  [id number]   MEDICAL RECORD NO.:  [id number]          PATIENT TYPE:  INP   LOCATION:  1226                         FACILITY:  [facility]   PHYSICIAN:  Ritta Slot, MD     DATE OF BIRTH:  September 29, 1926   DATE OF CONSULTATION:  03/29/2009  DATE OF DISCHARGE:                                 CONSULTATION   Ms. Ditton is a pleasant 77 year old female who we were asked to see  for a new onset of atrial fibrillation.  She was admitted to the  hospital on March 29, 2009, for rectal prolapse repair.  She tolerated  this well and was recovering postoperatively when she went into rapid  atrial fibrillation this morning.  Her rate is now down to 100 on IV  diltiazem and after one dose of beta-blocker.  She has no prior history  of coronary disease.  She denies any history of palpitations or  arrhythmia.  She is not aware that she is in a irregular rhythm now.  She has a history of remote mitral valve prolapse by echocardiogram in  the early 1980s.  As far she can remember she has never seen a  cardiologist.   HOME MEDICATIONS:  1. Advil p.r.n.  2. Vitamin D.  3. Aspirin 81 mg a day.  4. Fosamax once a week.  5. Clonazepam p.r.n.  6. Tramadol 50 mg h.s.   PAST MEDICAL HISTORY:  Remarkable for:  1. Peptic ulcer disease which was documented on endoscopy in April of      2010.  2. She has a history of diverticulosis.  3. She has had multiple abdominal surgeries including rectocele repair      in 1997, cholecystectomy in 2002, remote hysterectomy, hernia      repair x3.  4. She has had a history of venous ulcers of her right leg and was at      [facility] in 2008.  This was treated with skin grafting.   SOCIAL HISTORY:  She is a widow.  She has had four children, two of her  children died at young age of accidents.  She has four grandchildren and  three great-grandchildren.  She used to smoke, but quit in 1987.   FAMILY HISTORY:   Unremarkable for early coronary disease.   REVIEW OF SYSTEMS:  Essentially unremarkable, except for above.  She  denies any history of chest pain at home or exertional angina or  exertional dyspnea.  She has not had palpitations.  She does say that  when she was younger she had some thyroid problems, but is not on  medicine now.   PHYSICAL EXAMINATION:  Blood pressure is currently 98/60, heart rate  100, temperature 98.6.  GENERAL:  She is a well-developed elderly thin female in no acute  distress.  HEENT:  Normocephalic, atraumatic.  Extraocular motions are intact.  Sclerae nonicteric.  NECK:  Without JVD or bruit.  Thyroid is not enlarged.  CHEST:  Reveals diminished breath sounds, but no obvious rales.  CARDIAC EXAM:  Reveals irregularly irregular rhythm without obvious  murmur, rub or gallop.  ABDOMEN:  Not distended.  Surgical dressing is in place.  EXTREMITIES:  Reveal no edema.  Distal arterial pulses are diminished  bilaterally.  She has compression devices on both lower extremities for  DVT prophylaxis.  NEURO:  Exam is grossly intact.  She is awake, alert, oriented and  cooperative.  She  moves all extremities without obvious deficit.   LABS:  Sodium 137, potassium 4, BUN 4, creatinine 0.56.  CK-MB and  troponin are negative times one.  TSH is pending.   IMPRESSION:  1. New onset atrial fibrillation with rapid ventricular response.  2. Remote mitral valve prolapse by echocardiogram.  3. Postoperative day #3 of repair of rectal prolapse.  4. Peptic ulcer disease by endoscopy in April of 2010.  5. Remote smoking.   PLAN:  The patient will be seen by a cardiologist today.  An  echocardiogram and TSH have been ordered.  We will continue to cycle her  enzymes.      Abelino Derrick, P.A.      Ritta Slot, MD  Electronically Signed    LKK/MEDQ  D:  04/01/2009  T:  04/01/2009  Job:  [id number]

## 2010-12-16 NOTE — Discharge Summary (Signed)
NAMEGOLDIE, TREGONING                ACCOUNT NO.:  [id number]   MEDICAL RECORD NO.:  [id number]          PATIENT TYPE:  INP   LOCATION:  1343                         FACILITY:  [facility]   PHYSICIAN:  Massie Maroon, MD        DATE OF BIRTH:  July 07, 1927   DATE OF ADMISSION:  11/09/2008  DATE OF DISCHARGE:  11/12/2008                               DISCHARGE SUMMARY   DISCHARGE DIAGNOSES:  1. Diverticulosis (presumed diverticulitis, resolved).  2. Question of rectal tumor, biopsies pending.  3. Rectal lipoma.  4. Rectal prolapse.  5. Hemorrhoids.  6. Diarrhea, resolved.  Likely secondary to oral contrast from CT      scan.  7. Urinary tract infection resolved.  8. Right hydronephrosis with normal creatinine.  9. Ulcer seen at 25-30 cm on colonoscopy November 12, 2008, biopsy      pending.   CONSULTATION:  Dr. Sabino Gasser, gastroenterology.  Dr. Luretha Murphy,  general surgery.   PROCEDURES:  1. CT scan of the abdomen and pelvis November 09, 2008 at [facility]      Imaging showed mild biliary ductal dilation status post      cholecystectomy, pancreatic duct is borderline and prominent, no      definite focal abnormality in the region of the pancreatic head.      If clinically relevant, further evaluation with MRI can be      performed to exclude any obstructing lesion, right hydronephrosis,      right ureter decompressed, findings most compatible with UPJ      obstruction, exophytic cyst of the lower pole the right kidney      cannot be characterized without IV contrast.  Rectal lipoma.  There      is an adjacent soft tissue mass in the region of the rectum,      although this could be related to rectal prolapse, I cannot exclude      rectal mass or tumor, recommend clinical correlation, sigmoid      diverticulosis.  2. Colonoscopy November 12, 2008 which showed scattered tics, ulcers at      25-30 cm, inflamed rectum, and biopsies pending.   HOSPITAL COURSE:  A 77 year old female with a  history of diverticulosis,  cystocele and rectocele repair in 1997 was complaining of abdominal pain  starting on Friday November 02, 2008.  She described pain of achy and then  sharp.  The pain was in the left lower quadrant as well as in the right  lower quadrant but mostly in the left lower quadrant.  The patient was  seen in the office November 05, 2008 and thought to have probable  diverticulitis.  She was prescribed Flagyl 500 mg t.i.d.  This caused  improvement in her pain and discomfort.  She then apparently developed a  bit of loose stool, and came into the office on April 7 because of  hemorrhoids and rectal prolapse.  This was reduced.  There was a 2.5 cm  shining glistening nodule noted about 9 o'clock.  CT scan was ordered  and performed an November 09, 2008  which showed extensive sigmoid  diverticulosis as well as a 3.1 cm lipoma in the region of the rectum.  Immediately adjacent to the lipoma and inferior to lipoma, there is  apparent apparently some soft tissue fullness probably representing  rectal prolapse, but radiologist could not completely exclude rectal  mass.  After drinking the oral contrast she developed extensive  diarrhea.  This was very concerning and she was sent to the hospital for  observation.  Of note, there was some right hydro on CT scan possible  UPJ obstruction.  Her creatinine, however, was within normal limits.  The patient was admitted and diarrhea subsequently resolved with some  slight Imodium.  Stool studies were ordered but nursing staff was unable  to obtain them.  A CBC on admission showed a normal white count of 6.3  and hemoglobin of 9.9 along with a platelet count 243,000.  There was no  left shift.  Her sodium and potassium were within normal limits at 141  and 4.0.  Her BUN and creatinine were 6 and 0.7 and her AST was 27, ALT  19, amylase 60 and lipase 39.  The patient was continued on Flagyl and  diarrhea along with pain resolved.  Because of the  question of a rectal  mass seen on CT scan, Dr. Sabino Gasser was consulted.  The patient was  evaluated with colonoscopy which showed diverticulosis along with ulcers  at 25-30 cm.  Rectum was apparently inflamed.  There was evidence of  rectal prolapse.  [facility] was consulted after  colonoscopy for evaluation of rectal prolapse.  Dr. Luretha Murphy  kindly consulted and short-term the patient will be managed with  mechanical and dietary management, and will follow up with him as an  outpatient in 4 weeks.  She may apparently need a colectomy/rectopexy.  The patient appears to be in stable condition and desires to go home.  She is not having any abdominal pain, nausea, vomiting or bright red  blood per rectum or black stool at this time.  She appears to be in  stable condition.   PAST MEDICAL HISTORY:  1. Mild glucose intolerance (diet-controlled).  2. History of mitral valve prolapse on cardiac echo in the remote      past.  3. Osteoporosis, left wrist fracture 2007.  4. History of UTI.  5. History of pneumonia.   PAST SURGICAL HISTORY:  1. Cholecystectomy, May 2002.  2. History of hysterectomy for fibroids.  3. Hernia repair x3.  4. Cystocele and rectocele repair in 1997.  5. Colonoscopy June 14, 2001 which showed sigmoid diverticulosis,      mostly of the sigmoid colon, but occasionally right colon,      hemorrhoids.  6. History of microscopic hematuria with evaluation Dec 12, 2002.  7. Venous stasis and nonhealing ulcer on the right leg 2006 treated at      [facility].   SOCIAL HISTORY/FAMILY HISTORY:  Please see history and physical on November 09, 2008.   REVIEW OF SYSTEMS:  Negative for all 10 organ systems except for  pertinent positives stated above.   PHYSICAL EXAMINATION:  VITAL SIGNS:  Temperature 97.6, pulse 60,  respirations 20, blood pressure 119/58, pulse ox 96% on room air.  HEENT:  Anicteric, pink conjunctiva, no nystagmus,  pupils 1.5 mm,  symmetric, direct, consensual reflexes intact.  Mucous membranes moist,  midline.  NECK:  No JVD, no bruit, no thyromegaly.  HEART:  Regular rate rhythm, occasional  PAC.  LUNGS:  Clear to auscultation bilaterally.  ABDOMEN:  Soft, nontender, nondistended, positive normoactive bowel  sounds.  EXTREMITIES:  No cyanosis, clubbing or edema.  RECTAL:  Performed on November 07, 2008 see admission H and P.  SKIN:  No rashes.  LYMPHATICS:  No adenopathy.  NEURO:  Cranial nerves II-XII intact, reflexes 2+, symmetric, diffuse  with downgoing toes bilaterally, motor strength 5/5 in all four  extremities, pinprick intact.  No tremor.   LABS:  WBC 6.3, hemoglobin 9.9, platelet count 243,000.  These are labs  on November 09, 2008.  Sodium 141, potassium 4.0, BUN 6, creatinine 0.71,  AST 27, ALT 19, alkaline phosphatase 62, total bilirubin 0.4, amylase  60, lipase 39.   Labs November 11, 2008 WBC 4.1, hemoglobin 10.2, platelet count 259,000.  BUN 4, creatinine 0.68, AST 20, ALT 16, alkaline phosphatase 52, total  bilirubin 0.4.   CT scan the abdomen and pelvis November 09, 2008, please see procedure note  above.   EKG of November 09, 2008 showed normal sinus rhythm at 62, borderline first  degree AV block, prolonged QTC of 442, normal axis, occasional PAC.   ASSESSMENT:  1. Rectal prolapse.  2. Rectal lesion probably lipoma, await biopsies.  3. Abdominal pain resolved, probably secondary to sigmoid      diverticulitis.  4. Diarrhea likely secondary to diverticulitis and oral contrast given      with CT scan.  5. Right hydronephrosis with normal creatinine (can have outpatient      workup).  6. Occasional irregular heart beat secondary to premature atrial      contractions.  7. Ulcers at 20 to 30 cm in the colon, awaiting biopsy.  8. Osteoporosis.  9. Anxiety.  10.Glucose intolerance.  11.History of mitral valve prolapse on cardiac echo Dec 08, 1977 per      Dr. Donavan Burnet note.   12.Borderline first degree AV block on EKG November 09, 2008.  13.History of left wrist fracture 2007.  14.Cholecystectomy, May 2002.  15.Hysterectomy for fibroids in the past.  16.Hernia repair x3.  17.Cystocele and rectocele repair in 1997.  18.History of urinary tract infection.   PLAN:  We appreciate the kind input of Luretha Murphy, M.D., and the  patient will follow up with him in 4 weeks.  She will try to maintain  her stools as soft as possible with Colace 100 mg p.o. daily along with  MiraLax 17 grams p.o. daily p.r.n. constipation.  She will try to avoid  straining and heavy lifting.  We appreciate Dr. Pricilla Loveless kind input  and performing colonoscopy.  Colonoscopy showed ulcers at 25-30 cm.  We  are awaiting biopsies.  Showed inflamed rectum as well as scattered  diverticulosis, along with rectal prolapse.  The patient will follow up  with Dr. Pearson Grippe or Dr. Sabino Gasser in 2 weeks for followup of biopsy  report.  In terms of right hydronephrosis that was seen on CT scan, the  patient will have outpatient followup with urology.  This will be  arranged as an outpatient.      Massie Maroon, MD  Electronically Signed     JYK/MEDQ  D:  11/12/2008  T:  11/12/2008  Job:  [id number]

## 2010-12-16 NOTE — Discharge Summary (Signed)
NAMEDEROTHA, FISHBAUGH                ACCOUNT NO.:  [id number]   MEDICAL RECORD NO.:  [id number]          PATIENT TYPE:  INP   LOCATION:  1343                         FACILITY:  [facility]   PHYSICIAN:  Massie Maroon, MD        DATE OF BIRTH:  August 27, 1926   DATE OF ADMISSION:  11/09/2008  DATE OF DISCHARGE:  11/12/2008                               DISCHARGE SUMMARY   ADDENDUM:   ALLERGIES:  CODEINE.   DISCHARGE MEDICATIONS:  1. Colace 100 mg p.o. daily.  2. Multivitamin 1 p.o. daily.  3. Vitamin D 1200 international units p.o. daily.  4. Omnaris nasal spray 2 sprays intranasally daily p.r.n. allergies.  5. Tylenol 650 mg p.o. q.6 h. p.r.n. pain.  6. MiraLax 17 grams p.o. daily p.r.n. constipation.  7. Stop enteric-coated aspirin for now temporarily due to history of      some bleeding per rectum and stop Advil temporarily as well.   Advance diet as tolerated.      Massie Maroon, MD  Electronically Signed     JYK/MEDQ  D:  11/12/2008  T:  11/12/2008  Job:  [id number]   cc:   Georgiana Spinner, M.D.  Fax: [phone]   [place] Daphine Deutscher, MD  [address], [address]  [place]  [place] [address]

## 2010-12-16 NOTE — H&P (Signed)
Emily Wyatt, Emily Wyatt                ACCOUNT NO.:  [id number]   MEDICAL RECORD NO.:  [id number]          PATIENT TYPE:  INP   LOCATION:  1343                         FACILITY:  [facility]   PHYSICIAN:  Massie Maroon, MD        DATE OF BIRTH:  1927/03/12   DATE OF ADMISSION:  11/09/2008  DATE OF DISCHARGE:                              HISTORY & PHYSICAL   CHIEF COMPLAINT:  Abdominal pain, diarrhea, rectal prolapse, rectal  lesion.   HISTORY OF PRESENT ILLNESS:  A 77 year old female with a history of  diverticulosis, cystocele and rectocele repair in 1997, complains of  abdominal pain starting on Friday, November 02, 2008.  She first described  the pain as achy and then sharp.  She apparently took a laxative and  still continued to have discomfort.  She did have a bowel movement.  She  felt constipated at that time.  She was not initially having diarrhea.  The pain was in the left lower quadrant as well as the right lower  quadrant.  The patient was seen in the office on November 05, 2008, and  thought on exam to have probably diverticulitis.  She was prescribed  Flagyl 500 mg t.i.d.  This caused her to have improvement in terms of  the pain.  She apparently then developed a little bit of loose stool and  then came to the office on November 07, 2008, because of hemorrhoids/rectal  prolapse.  This was reduced.  There was a 2.5-cm shiny glistening nodule  noted at about 9 o'clock.  A CT scan was ordered and performed on November 09, 2008, which showed extensive sigmoid diverticulosis as well as a 3.1-  cm lipoma in the region of the rectum.  Immediately adjacent to the  lipoma and inferior to the lipoma, there was apparently some soft tissue  fullness which probably could represent rectal prolapse but the  radiologist could not completely exclude a rectal mass.  After drinking  the oral contrast, she had diarrhea.  The patient had extensive  diarrhea.  It was very concerning to her and so she was sent to the  hospital for observation.  Of note, there was some right hydro on the CT  scan, possibly UPJ obstruction.   The patient will be admitted for resolving diverticulitis, diarrhea,  rule out of rectal mass.   PAST MEDICAL HISTORY:  1. Mild glucose intolerance (diet-controlled).  2. History of mitral valve prolapse on cardiac echo.  3. Osteoporosis, left wrist fracture 2007.  4. History of UTI.  5. Pneumonia.   PAST SURGICAL HISTORY:  1. Cholecystectomy, May 2002.  2. History of hysterectomy for fibroids.  3. Hernia repair x3.  4. Cystocele and rectocele repair in 1997.  5. Colonoscopy, June 14, 2001, which showed diverticulosis, mostly      of the sigmoid colon but occasionally right colon, hemorrhoids.  6. History of microscopic hematuria with evaluation, Dec 12, 2002.  7. Venous stasis and nonhealing ulcer on the right leg, 2006, treated      at [facility].   HEALTH  MAINTENANCE:  Tetanus November 08, 2006.  Pneumovax, November 2006.  Mammogram, Dec 14, 2005, negative.  Bone density test, April 16, 2003, which showed osteoporosis.   SOCIAL HISTORY:  Husband deceased, four children, two sons are deceased  (one drowned at age 63, another died in 24 due to horseback riding  accident).  The patient has two daughters, four grandchildren and three  great-grandchildren, G5, P4, A1.  Former smoker, quit 1987, one pack per  day x10 years.  The patient denies any alcohol use.  She used to work at  [employer].   FAMILY HISTORY:  Mother had a stroke and pneumonia at age 15, father  died of a stroke and pneumonia at age 29.  Her mother had hypertension.  Siblings:  Three brothers, two sisters:  One died of a stroke at age 54,  she had an enlarged heart and two heart attacks.  She also had pulmonary  emboli.  Another sister died at age 72 of stomach cancer.  Her maternal  aunt had cancer.  Her maternal uncle died of heart attack.  A nephew had  diabetes.   ALLERGIES:   CODEINE.   MEDICATIONS:  1. Enteric-coated aspirin 81 mg p.o. daily.  2. Multivitamin one p.o. daily.  3. Tylenol p.r.n.  4. Advil p.r.n.  5. Vitamin D 1200 international units daily.  6. Omnaris nasal spray 2 sprays intranasally p.r.n.  7. Flagyl 500 mg p.o. t.i.d. (started on November 05, 2008).  8. Bactrim DS (started on  November 08, 2008).   REVIEW OF SYSTEMS:  The patient is anxious about her condition,  otherwise review of systems is negative for fever, chills, abdominal  pain presently, nausea, vomiting, constipation, bright red blood per  rectum or black stool.  She also denies any dysuria or hematuria.  Review of systems is negative for all 10 organ systems other then  pertinent positives stated above.   PHYSICAL EXAMINATION:  Temperature 97.6, pulse 61, blood pressure  120/60, respirations 20, pulse ox 92% on room air.  HEENT: Anicteric, slightly actually anicteric.  Pink conjunctivae.  EOMI.  No nystagmus.  Pupils 1.5 mm, symmetric, direct, consensual, near  reflex intact.  Mucous membranes moist.  Tongue midline.  NECK: No JVD.  No bruit.  No thyromegaly.  No adenopathy.  HEART: Regular rate, rhythm.  Occasional PAC?  No murmurs, gallops or  rubs.  LUNGS: Clear to auscultation bilaterally.  ABDOMEN: Soft, nontender, nondistended.  Positive bowel sounds.  EXTREMITIES: No cyanosis, clubbing or edema.  RECTAL:  On November 07, 2008, showed a 2.5-cm glistening smooth, round mass  and rectal prolapse.  Did not visualize any rectal mass at that time.  SKIN:  No rashes.  LYMPH NODES:  No adenopathy.  NEURO:  Nonfocal.  Cranial nerves II-XII intact.  Reflexes 2+,  symmetric, diffuse with downgoing toes bilaterally.  Motor strength 5/5  in all four extremities, pinprick intact.  No tremor.  PSYCH:  Alert and oriented x3.   LABS:  WBC 6.3, hemoglobin 9.9, platelet count 243, sodium 141,  potassium 4.0, chloride 109, bicarb 26, BUN 6, creatinine 0.71, AST 27,  ALT 19, amylase 60,  lipase 39.   CT scan of the abdomen and pelvis on November 09, 2008, showed the patient  is status post cholecystectomy, intrahepatic and extrahepatic biliary  ductal dilation.  Common bile duct measures 12 mm in the pancreatic  head.  Pancreatic duct is borderline, 3-4 mm in diameter, no definitive  abnormality within the pancreatic  head region.  Tiny low density area in  the right hepatic lobe is noted most likely a cyst although this cannot  be characterized due to lack of IV contrast and a small size.  Spleen,  adrenal glands are unremarkable.  There appears to be hydronephrosis on  the right.  Right ureter is decompressed, suggestive of the UPJ  obstruction.  No stone visualized.  Exophytic cyst off the pole lower on  the right kidney measuring up to 3.1 cm.  Mild gaseous distention of the  stomach, no bowel obstruction.  Aorta is normal caliber, degenerative  changes in the spine, extensive sigmoid diverticulosis, no evidence of  active diverticulitis, no free fluid, no free air, no adenopathy,  bladder is unremarkable.  Within the region of the rectum, there is a  3.1-cm lipoma noted; however, immediately adjacent to lipoma and  inferior to the lipoma, there is soft tissue fullness.  It could  represent a rectal prolapse.  Cannot completely exclude a rectal mass,  recommend clinical correlation.   ASSESSMENT:  1. Diarrhea, likely possibly due to oral contrast given with CT scan.  2. Abdominal pain resolved, probably secondary to sigmoid      diverticulitis.  3. Rectal prolapse.  4. Rectal lesion, probably lipoma.  5. Right hydronephrosis.  6. Occasional irregular heartbeat, probably premature atrial      contraction.  7. Osteoporosis.  8. Anxiety.  9. Glucose intolerance (diet-controlled).  10.History of mitral valve prolapse on cardiac echo, May 1979, per Dr.      Pincus Sanes note.  11.Left wrist fracture 2007.  12.Cholecystectomy, May 2002.  13.History of hysterectomy for  fibroids.  14.Hernia repair x3.  15.Cystocele and rectocele repair in 1997.  16.Urinary tract infection.   PLAN:  The patient will be admitted for observation.  We will check a  CBC, CMP, amylase and lipase as stated above.  The patient will be  continued on Flagyl 500 mg p.o. t.i.d. along with Bactrim DS one p.o.  b.i.d. (the Bactrim is for UTI).  The patient is on day 5 of 7 of  Flagyl.  We will check stool studies, stool for fecal leukocytes, stool  for culture, stool for Clostridium difficile, stool for ova and  parasites to rule out any infectious cause of diarrhea.  We will consult  Dr. Virginia Rochester for evaluation of diarrhea, rectal mass which is most probably  lipoma, intrahepatic and extrahepatic ductal dilation.  Depending on Dr.  Wende Neighbors recommendation, we will consider whether the patient needs a  surgical consult for possible rectal prolapse as well as possibly right  hydronephrosis.  She may need a urology consult for this.  We will defer  until.  We appreciate Dr. Wende Neighbors input greatly.  The patient will be  prescribed Imodium as needed to help her with her diarrhea.  At the  present time, this appears to be slowing.  We will hydrate her gently  with normal saline as she is probably dehydrated from the diarrhea.  For  DVT prophylaxis, we will use Lovenox 40 mg subcutaneous q.p.m.      Massie Maroon, MD  Electronically Signed     JYK/MEDQ  D:  11/09/2008  T:  11/09/2008  Job:  [id number]

## 2010-12-19 NOTE — Procedures (Signed)
[place]  Patient:    Emily Wyatt, Emily Wyatt Visit Number: [id number] MRN: [id number]          Service Type: END Location: ENDO Attending Physician:  Sabino Gasser Dictated by:   Sabino Gasser, M.D. Proc. Date: 06/14/01 Admit Date:  06/14/2001                             Procedure Report  PROCEDURE PERFORMED:  Colonoscopy.  ENDOSCOPIST:  Sabino Gasser, M.D.  INDICATIONS FOR PROCEDURE:  Rectal bleeding.  ANESTHESIA:  Demerol 40 mg, Versed 4 mg.  DESCRIPTION OF PROCEDURE:  With the patient mildly sedated in the left lateral decubitus position, the Olympus videoscopic pediatric colonoscope was inserted in the rectum and passed under direct vision into the cecum.  The cecum was identified by the ileocecal valve and appendiceal orifice, both of which were photographed.  From this point, the colonoscope was slowly withdrawn, taking circumferential views of the entire colonic mucosa, stopping to photograph diverticula scattered in the right colon but moderately severe in the sigmoid colon.  This was done until we reached the rectum, which appeared normal on direct view.  We could not place the endoscope on retroflex view.  She would not hold the air long enough to do that so we straightened the endoscope and pulled through the anal canal which showed hemorrhoids.  The endoscope was straightened and withdrawn.  Patients vital signs and pulse oximeter remained stable.  The patient tolerated the procedure well and without apparent complications.  FINDINGS:  Hemorrhoids and diverticulosis mostly in the sigmoid colon, but also some scattered around to the right colon.  PLAN:  Follow up with me as needed. Dictated by:   Sabino Gasser, M.D. Attending Physician:  Sabino Gasser DD:  06/14/01 TD:  06/14/01 Job: [id number]

## 2010-12-19 NOTE — Op Note (Signed)
[facility]  Patient:    Emily Wyatt, Emily Wyatt                       MRN: [id number] Proc. Date: 12/21/00 Adm. Date:  [id number] Attending:  Tempie Donning CC:         Janae Bridgeman. Eloise Harman., M.D.   Operative Report  OPERATIVE PROCEDURE:  Laparoscopic cholecystectomy.  SURGEON:  Gita Kudo, M.D.  ASSISTANT:  Milus Mallick, M.D.  ANESTHESIA:  General endotracheal.  PREOPERATIVE DIAGNOSIS:  Gallstones.  POSTOPERATIVE DIAGNOSIS:  Gallstones.  CLINICAL SUMMARY:  A 77 year old female with severe attack of upper abdominal pain seen in the emergency room, and work-up showed gallstones on ultrasound, and her liver function studies and amylase were normal.  OPERATIVE FINDINGS:  The patient had previous abdominal surgery - lower, and had very few adhesions.  In the upper abdomen, the liver looked normal.  The gallbladder was thin-walled and not acutely inflamed.  The cystic duct and artery were normal in size and anatomy.  OPERATIVE PROCEDURE:  Under satisfactory general endotracheal anesthesia, having received 1.0 gram Ancef preoperatively, the patients abdomen was prepped and draped in a standard fashion.  A transverse incision was made above the umbilicus and a very small umbilical hernia encountered and the sac opened and the edges delineated.  This was controlled with a figure-of-eight 0 Vicryl suture and a Hasson port inserted into the peritoneum and secured. Good CO2 peritoneum established and camera placed.  With excellent visualization, two #5 ports were placed laterally and a second #10 port medially through Marcaine-infiltrated skin sites.  With graspers through the lateral ports giving good exposure, I operated through the upper port and carefully identified the cystic duct gallbladder junction.  A right-angle clamp was used to dissect and circumferentially delineate the cystic duct and artery.  When certain of the anatomy, the  structures were controlled with multiple metal clips and divided between the distal two.  Gallbladder was then removed from below upward.  It was thin-walled, and a small hole was made during the dissection with golden, clear bile coming out.  This was suctioned away, and no stones were spilled.  The dissection was continued until the gallbladder was freed from the liver bed.  The liver bed was checked for hemostasis and was made dry by cautery.  Through the upper port, an EndoCatch bag was placed and the gallbladder placed in this and secured.  Then the camera was moved to the upper port and a large grasper through the umbilical port used to retrieve the enclosed gallbladder without complication or spillage.  Then the abdomen was lavaged with saline and suctioned dry.  The ports were removed under direct vision.  CO2 released. Following this, the midline incision through the hernia sac was closed with a second interrupted 0 Vicryl widely placed suture, and then the figure-of-eight also tied, giving good closure.  This incision was infiltrated with Marcaine, and then the subcutaneous was approximated with 4-0 Monocryl and skin edges approximated with Steri-Strips.  Sterile absorbent dressings applied, and the patient went to the recovery room from the operating room in good condition without complication. DD:  12/21/00 TD:  12/21/00 Job: [id number]

## 2010-12-19 NOTE — Op Note (Signed)
[place]. [facility]  Patient:    Emily, Wyatt Visit Number: [id number] MRN: [id number]          Service Type: END Location: ENDO Attending Physician:  Sabino Gasser Dictated by:   Zigmund Daniel, M.D. Proc. Date: 06/13/01 Admit Date:  06/14/2001   CC:         Terald Sleeper, M.D.   Operative Report  PREOPERATIVE DIAGNOSIS:  Gangrene of both feet.  POSTOPERATIVE DIAGNOSIS:  Gangrene of both feet.  OPERATION PERFORMED:  Bilateral above-knee amputation.  SURGEON:  Zigmund Daniel, M.D.  ANESTHESIA:  General.  INDICATIONS FOR PROCEDURE:  The patient is an elderly black female with painful ulcerations of both feet and legs and known severe distal vascular disease.  She has very severe heart disease with an ejection fraction of 20 to 25% but this has been stable and she is felt to be in as good a condition as is possible for her.  She had a coagulopathy which was worked up prior to surgery and she was seen by hematology and it was felt that she was not at high risk for bleeding.  DESCRIPTION OF PROCEDURE:  After the patient had adequate monitoring and general anesthesia and routine preparation and draping of the thighs and legs excluding the feet and ulcerations with impervious stockinets, I worked first on the left side and made a circular incision around the thigh a handbreadth above the top of the patella and dissected down through the subcutaneous fat and ligated the long saphenous vein with 2-0 Vicryl.  I then incised muscle fascia just slightly proximal to the skin incision and dissected down on each side of the femur.  I encountered, ligated and divided with #1 Vicryl the superficial femoral artery and femoral vein.  These were securely ligated. Pulse was present.  I then stripped periosteum from the femur for approximately 5 cm cephalad from the skin incision and divided the femur with the Gigli saw and then divided the posterior  muscles and skin and ligated the sciatic nerve with #1 Vicryl.  After getting good hemostasis and irrigating the wound, I closed the fascia front to back with running 2-0 Vicryl suture. I then closed the skin with staples.  There was no skin tension and the shape of the stump was good.  I then followed an identical procedure on the right lower extremity.  No evidence of infection at the amputation site was found on either side.  The patient was stable through the procedures.  Bulky slightly compressive bandages were applied and she went to PACU in good condition. Dictated by:   Zigmund Daniel, M.D. Attending Physician:  Sabino Gasser DD:  06/14/01 TD:  06/14/01 Job: [id number]

## 2011-03-23 ENCOUNTER — Encounter (HOSPITAL_BASED_OUTPATIENT_CLINIC_OR_DEPARTMENT_OTHER): Payer: Medicare Other | Attending: Internal Medicine

## 2011-03-23 DIAGNOSIS — L97809 Non-pressure chronic ulcer of other part of unspecified lower leg with unspecified severity: Secondary | ICD-10-CM | POA: Insufficient documentation

## 2011-03-23 DIAGNOSIS — I8390 Asymptomatic varicose veins of unspecified lower extremity: Secondary | ICD-10-CM | POA: Insufficient documentation

## 2011-03-23 DIAGNOSIS — I872 Venous insufficiency (chronic) (peripheral): Secondary | ICD-10-CM | POA: Insufficient documentation

## 2011-03-23 DIAGNOSIS — Z7901 Long term (current) use of anticoagulants: Secondary | ICD-10-CM | POA: Insufficient documentation

## 2011-03-23 NOTE — Progress Notes (Unsigned)
[facility]  NAME:  Emily Wyatt, Emily Wyatt                ACCOUNT NO.:  [id number]  MEDICAL RECORD NO.:  [id number]      DATE OF BIRTH:  05-23-1927  PHYSICIAN:  Jonelle Sports. Sevier, M.D.  VISIT DATE:  03/23/2011                                  OFFICE VISIT   HISTORY:  This 77 year old white female is seen for assistance with management of the wound on her right medial lower extremity which is now about 60 weeks of age.  The patient has severe chronic venous insufficiency of the lower extremities and has had a previous ulceration some 7 years ago in the gaiter area on the right which was followed at the wound care center at [facility] that eventually led to her hospitalization with skin grafting.  That has healed and has not recurred.  About 4 weeks ago, she apparently sustained some slight trauma to an area just medial and distal to the previous ulceration and developed a sore there.  She has been treating it with topical applications over that period of time and although says it has shown some improvement, it has not healed and she is concerned.  She does not report fever, chills or systemic symptoms.  She notices no drainage particularly from the wound and no significant odor.  There has been no unusual swelling of that extremity although she has not worn her usual compression stocking since this wound occurred.  PAST MEDICAL HISTORY:  Notable surgically for hysterectomy some 20 years ago, cholecystectomy some 10 years ago, and surgery for diverticulitis within the past 18 months.  She had been hospitalized for atrial fibrillation.  REGULAR MEDICATIONS: 1. Amoxicillin presumably 500 mg b.i.d. 2. Coumadin by schedule. 3. Vitamin B12 500 mcg orally daily. 4. Vitamin D3 100,000 international units daily. 5. Clozaril in its usual dose as a probiotic. 6. Silver sulfadiazine applied to the wound daily.  ALLERGIES:  She has no known medicinal allergies.  PERSONAL  HISTORY:  The patient is widowed and lives alone but has a daughter who is nearby and very solicitous.  She is able to take care of her activities of daily living and so forth without significant assistance.  She does not smoke, use alcohol or recreational drugs and physically is quite independent.  FAMILY HISTORY:  Is not reviewed in detail but is not pertinent to this illness.  REVIEW OF SYSTEMS:  The patient has had difficulties with significant varicosities on the lower extremities and the prior ulceration as previously mentioned.  She believes that at the time of that prior grafting she did have arterial studies in the lower extremities which were satisfactory as well.  Her atrial fibrillation has responded well to treatment and she currently is in sinus rhythm.  She apparently has never had any TIAs or other suspicions from embolic episodes related to that.  She has no history of congestive heart failure.  She has no history of peptic ulcer disease, GI blood loss, colonic polyps, and has had no further hepatobiliary trouble since her cholecystectomy a number of years ago. She is a nonsmoker and has no pulmonary disease.  No current symptomatology related to that system.  She never had a stroke, TIA, seizures, severe headaches or other neurologic problems.  She has no symptoms of  peripheral neuropathy.  Endocrine with no history of diabetes, no thyroid disorder. Hematologic:  No anemia, free bleeding or other such problems.  She is currently anticoagulated as indicated.  PHYSICAL EXAMINATION:  VITAL SIGNS:  Blood pressure is 181/76, pulse 64 and regular, respirations 20, temperature 97.7. GENERAL:  She is slender, alert, cooperative elderly white female who appears younger than her stated age of 44.  She is a bit apprehensive about being in the wound care center again. SKIN:  Warm and dry.  Her turgor is good.  Mucous membranes are moist. CHEST:  Grossly clear. HEART:   Regular with no definite murmur or gallop at this time. ABDOMEN:  Scaphoid without organomegaly or tenderness. EXTREMITIES:  Severe varicosities throughout both lower extremities particularly distal to the knee but with remarkably little stasis change.  Her pulses are palpable and quite bounding in both feet.  On the right to medial ankle very near the malleolar area but just proximal to it, there is a small ulcer measuring 1.0 x 0.5 x 0.2 cm with some fibrinous appearing exudate in its base.  There is a little surrounding erythema but nothing to suggest a deep infection.  IMPRESSION:  Traumatically induced stasis ulcer right lower extremity.  DISPOSITION:  Using topical Xylocaine the wound is debrided of the fibrinous exudate and also one small clot from its distal portion.  The wound base then looks reasonably healthy.  She is treated with an application of silver collagen (this after a culture was made) and that extremity is then placed in an Unna wrap.  Followup visit will be here in 1 week.  Meanwhile, the patient is advised to complete the antibiotic she has on hand.          ______________________________ Jonelle Sports. Cheryll Cockayne, M.D.     RES/MEDQ  D:  03/23/2011  T:  03/23/2011  Job:  [id number]

## 2011-04-07 ENCOUNTER — Encounter (HOSPITAL_BASED_OUTPATIENT_CLINIC_OR_DEPARTMENT_OTHER): Payer: Medicare Other | Attending: Internal Medicine

## 2011-04-07 DIAGNOSIS — L97809 Non-pressure chronic ulcer of other part of unspecified lower leg with unspecified severity: Secondary | ICD-10-CM | POA: Insufficient documentation

## 2011-04-07 DIAGNOSIS — I8390 Asymptomatic varicose veins of unspecified lower extremity: Secondary | ICD-10-CM | POA: Insufficient documentation

## 2011-04-07 DIAGNOSIS — Z7901 Long term (current) use of anticoagulants: Secondary | ICD-10-CM | POA: Insufficient documentation

## 2011-04-07 DIAGNOSIS — I872 Venous insufficiency (chronic) (peripheral): Secondary | ICD-10-CM | POA: Insufficient documentation

## 2011-05-15 LAB — CBC
HCT: 30.8 — ABNORMAL LOW
Hemoglobin: 10.7 — ABNORMAL LOW
MCHC: 34.7
RBC: 3.33 — ABNORMAL LOW
RDW: 12.9

## 2011-05-15 LAB — DIFFERENTIAL
Eosinophils Relative: 1
Monocytes Absolute: 0.3
Neutro Abs: 5
Neutrophils Relative %: 81 — ABNORMAL HIGH

## 2011-05-15 LAB — COMPREHENSIVE METABOLIC PANEL
ALT: 16
Alkaline Phosphatase: 71
BUN: 10
CO2: 27
GFR calc non Af Amer: >60
Glucose, Bld: 111 — ABNORMAL HIGH
Potassium: 5
Sodium: 141
Total Protein: 6.2

## 2011-05-15 LAB — POCT CARDIAC MARKERS: Troponin i, poc: <0.05

## 2011-07-17 ENCOUNTER — Inpatient Hospital Stay (HOSPITAL_COMMUNITY)
Admission: EM | Admit: 2011-07-17 | Discharge: 2011-07-23 | DRG: 378 | Disposition: A | Discharge status: Home / Self Care | Payer: Medicare Other | Attending: Internal Medicine | Admitting: Internal Medicine

## 2011-07-17 ENCOUNTER — Encounter: Payer: Self-pay | Admitting: Emergency Medicine

## 2011-07-17 DIAGNOSIS — N390 Urinary tract infection, site not specified: Secondary | ICD-10-CM | POA: Diagnosis present

## 2011-07-17 DIAGNOSIS — K5733 Diverticulitis of large intestine without perforation or abscess with bleeding: Principal | ICD-10-CM | POA: Diagnosis present

## 2011-07-17 DIAGNOSIS — K5792 Diverticulitis of intestine, part unspecified, without perforation or abscess without bleeding: Secondary | ICD-10-CM | POA: Diagnosis present

## 2011-07-17 DIAGNOSIS — I4891 Unspecified atrial fibrillation: Secondary | ICD-10-CM | POA: Diagnosis present

## 2011-07-17 DIAGNOSIS — D500 Iron deficiency anemia secondary to blood loss (chronic): Secondary | ICD-10-CM | POA: Diagnosis present

## 2011-07-17 DIAGNOSIS — E876 Hypokalemia: Secondary | ICD-10-CM | POA: Diagnosis present

## 2011-07-17 DIAGNOSIS — K922 Gastrointestinal hemorrhage, unspecified: Secondary | ICD-10-CM

## 2011-07-17 DIAGNOSIS — K623 Rectal prolapse: Secondary | ICD-10-CM | POA: Diagnosis present

## 2011-07-17 DIAGNOSIS — Z7901 Long term (current) use of anticoagulants: Secondary | ICD-10-CM | POA: Diagnosis present

## 2011-07-17 DIAGNOSIS — K5730 Diverticulosis of large intestine without perforation or abscess without bleeding: Secondary | ICD-10-CM | POA: Diagnosis present

## 2011-07-17 HISTORY — DX: Long term (current) use of anticoagulants: Z79.01

## 2011-07-17 HISTORY — DX: Rectal prolapse: K62.3

## 2011-07-17 HISTORY — DX: Diverticulitis of intestine, part unspecified, without perforation or abscess without bleeding: K57.92

## 2011-07-17 HISTORY — DX: Iron deficiency anemia secondary to blood loss (chronic): D50.0

## 2011-07-17 HISTORY — DX: Unspecified atrial fibrillation: I48.91

## 2011-07-17 HISTORY — DX: Diverticulosis of large intestine without perforation or abscess without bleeding: K57.30

## 2011-07-17 HISTORY — DX: Gastrointestinal hemorrhage, unspecified: K92.2

## 2011-07-17 LAB — CBC
HCT: 28.7 % — ABNORMAL LOW (ref 36.0–46.0)
Hemoglobin: 9.9 g/dL — ABNORMAL LOW (ref 12.0–15.0)
WBC: 13.6 10*3/uL — ABNORMAL HIGH (ref 4.0–10.5)

## 2011-07-17 LAB — COMPREHENSIVE METABOLIC PANEL
ALT: 9 U/L (ref 0–35)
AST: 18 U/L (ref 0–37)
Alkaline Phosphatase: 68 U/L (ref 39–117)
CO2: 24 mEq/L (ref 19–32)
GFR calc Af Amer: 89 mL/min — ABNORMAL LOW (ref >90)
GFR calc non Af Amer: 77 mL/min — ABNORMAL LOW (ref >90)
Glucose, Bld: 159 mg/dL — ABNORMAL HIGH (ref 70–99)
Potassium: 4 mEq/L (ref 3.5–5.1)
Sodium: 129 mEq/L — ABNORMAL LOW (ref 135–145)

## 2011-07-17 LAB — DIFFERENTIAL
Basophils Absolute: 0 10*3/uL (ref 0.0–0.1)
Basophils Relative: 0 % (ref 0–1)
Lymphocytes Relative: 6 % — ABNORMAL LOW (ref 12–46)
Monocytes Relative: 8 % (ref 3–12)
Neutro Abs: 11.7 10*3/uL — ABNORMAL HIGH (ref 1.7–7.7)

## 2011-07-17 MED ORDER — SODIUM CHLORIDE 0.9 % IV SOLN
20.0000 mL | INTRAVENOUS | Status: DC
  Administered 2011-07-17: 20 mL via INTRAVENOUS

## 2011-07-17 MED ORDER — SODIUM CHLORIDE 0.9 % IV BOLUS (SEPSIS)
500.0000 mL | Freq: Once | INTRAVENOUS | Status: AC
  Administered 2011-07-17: 500 mL via INTRAVENOUS

## 2011-07-17 NOTE — ED Notes (Signed)
Per EMS pt from home and tonight past started bleeding from Rectum while in the bathroom and lost about 250-300 cc of blood and blood clots that was bright red.

## 2011-07-17 NOTE — ED Notes (Signed)
MD at bedside. 

## 2011-07-17 NOTE — ED Notes (Signed)
ION:[id number]<MW> Expected date:07/17/11<BR> Expected time: 8:37 PM<BR> Means of arrival:Ambulance<BR> Comments:<BR> EMS 261 GC, 84 yof abd. Pain w GI bleed

## 2011-07-17 NOTE — ED Notes (Signed)
20 Gauge in left AC and 18 in right forearm. BP 96/56, HR 72. Pt on Coumadin.

## 2011-07-17 NOTE — ED Provider Notes (Addendum)
History     CSN: [id number] Arrival date & time: 07/17/2011  8:49 PM   First MD Initiated Contact with Patient 07/17/11 2108      Chief Complaint  Patient presents with  . Rectal Bleeding    (Consider location/radiation/quality/duration/timing/severity/associated sxs/prior treatment) HPI 77 year old female presents to the emergency department from home with her family with report of acute onset of bright red blood per rectum around 7:30 this evening. Patient had complained of headache and abdominal pain yesterday, has history of diverticulitis with 2 prior colon resections about a year and a half ago. No abdominal pain today. No history of GI bleed in the past. Patient denies any weakness or dizziness at this time. Patient is on Coumadin for history of atrial fibrillation Past Medical History  Diagnosis Date  . Diverticulitis   . Atrial fibrillation     No past surgical history on file.  No family history on file.  History  Substance Use Topics  . Smoking status: Not on file  . Smokeless tobacco: Not on file  . Alcohol Use:     OB History    Grav Para Term Preterm Abortions TAB SAB Ect Mult Living                  Review of Systems  Neurological: Positive for headaches.  All other systems reviewed and are negative.    Allergies  Codeine  Home Medications   Current Outpatient Rx  Name Route Sig Dispense Refill  . CLONAZEPAM 0.5 MG PO TABS Oral Take 0.5 mg by mouth daily as needed. anxiety     . ESCITALOPRAM OXALATE 5 MG PO TABS Oral Take 5 mg by mouth daily.      . WARFARIN SODIUM 2.5 MG PO TABS Oral Take 5 mg by mouth daily.        BP 95/48  Pulse 65  Temp(Src) 98.1 F (36.7 C) (Oral)  Ht 5\' 6"  (1.676 m)  Wt 125 lb (56.7 kg)  BMI 20.18 kg/m2  SpO2 97%  Physical Exam  Nursing note and vitals reviewed. Constitutional: She is oriented to person, place, and time.       Frail appearing  HENT:  Head: Normocephalic and atraumatic.  Nose: Nose normal.    Mouth/Throat: Oropharynx is clear and moist.  Eyes: Conjunctivae and EOM are normal. Pupils are equal, round, and reactive to light.  Neck: Normal range of motion. Neck supple. No JVD present. No tracheal deviation present. No thyromegaly present.  Cardiovascular: Normal rate, regular rhythm, normal heart sounds and intact distal pulses.  Exam reveals no gallop and no friction rub.   No murmur heard. Pulmonary/Chest: Effort normal and breath sounds normal. No stridor. No respiratory distress. She has no wheezes. She has no rales. She exhibits no tenderness.  Abdominal: Soft. Bowel sounds are normal. She exhibits no distension and no mass. There is no tenderness. There is no rebound and no guarding.  Genitourinary: Guaiac positive stool.       Rectal exam shows no active bleeding at this time, however bright red blood on glove. No hemorrhoids noted  Musculoskeletal: Normal range of motion. She exhibits no edema and no tenderness.  Lymphadenopathy:    She has no cervical adenopathy.  Neurological: She is oriented to person, place, and time. She exhibits normal muscle tone. Coordination normal.  Skin: Skin is dry. No rash noted. No erythema. No pallor.  Psychiatric: She has a normal mood and affect. Her behavior is normal. Judgment and thought  content normal.    ED Course  Procedures (including critical care time)  Labs Reviewed  CBC - Abnormal; Notable for the following:    WBC 13.6 (*)    RBC 3.05 (*)    Hemoglobin 9.9 (*)    HCT 28.7 (*)    All other components within normal limits  DIFFERENTIAL - Abnormal; Notable for the following:    Neutrophils Relative 86 (*)    Lymphocytes Relative 6 (*)    Neutro Abs 11.7 (*)    Monocytes Absolute 1.1 (*)    All other components within normal limits  COMPREHENSIVE METABOLIC PANEL - Abnormal; Notable for the following:    Sodium 129 (*)    Glucose, Bld 159 (*)    Albumin 3.3 (*)    GFR calc non Af Amer 77 (*)    GFR calc Af Amer 89 (*)     All other components within normal limits  POCT OCCULT BLOOD STOOL, DEVICE  TYPE AND SCREEN  PROTIME-INR   No results found.   No diagnosis found.    MDM  76 year old female with GI bleed concern from diverticular disease. Patient is on Coumadin awaiting PT and INR results. Patient will need admission        Olivia Mackie, MD 07/17/11 2322  Prior notes reviewed. Patient had recurrent rectal prolapse, and sigmoid volvulus April 2011 and patient was transferred from [place] [facility] to [facility] for further workup. Patient has history of diverticulosis and had some surgeries in 2010 for original and in sigmoid and rectal prolapse.. Patient was seen by Dr. Elnoria Howard at that time as well as Dr. Daphine Deutscher.  Olivia Mackie, MD 07/17/11 2332  Discuss case with hospitalist who will admit, but requests call back when PT and INR have been completed. Discussed with nurse and tube has been sent to the lab and we're awaiting results. Care passed to Dr. Patrica Duel awaiting lab results  Olivia Mackie, MD 07/17/11 [phone]

## 2011-07-18 ENCOUNTER — Emergency Department (HOSPITAL_COMMUNITY): Payer: Medicare Other

## 2011-07-18 ENCOUNTER — Encounter (HOSPITAL_COMMUNITY): Payer: Self-pay

## 2011-07-18 ENCOUNTER — Other Ambulatory Visit: Payer: Self-pay

## 2011-07-18 LAB — URINALYSIS, ROUTINE W REFLEX MICROSCOPIC
Bilirubin Urine: NEGATIVE
Ketones, ur: NEGATIVE mg/dL
Nitrite: NEGATIVE
Urobilinogen, UA: 0.2 mg/dL (ref 0.0–1.0)
pH: 7 (ref 5.0–8.0)

## 2011-07-18 LAB — URINE MICROSCOPIC-ADD ON

## 2011-07-18 LAB — APTT: aPTT: 45 seconds — ABNORMAL HIGH (ref 24–37)

## 2011-07-18 LAB — BASIC METABOLIC PANEL
Chloride: 98 mEq/L (ref 96–112)
GFR calc Af Amer: >90 mL/min (ref >90)
GFR calc non Af Amer: 84 mL/min — ABNORMAL LOW (ref >90)
Glucose, Bld: 99 mg/dL (ref 70–99)
Potassium: 3.3 mEq/L — ABNORMAL LOW (ref 3.5–5.1)
Sodium: 132 mEq/L — ABNORMAL LOW (ref 135–145)

## 2011-07-18 LAB — CBC
HCT: 26.2 % — ABNORMAL LOW (ref 36.0–46.0)
Hemoglobin: 9 g/dL — ABNORMAL LOW (ref 12.0–15.0)
MCH: 32.3 pg (ref 26.0–34.0)
RBC: 2.79 MIL/uL — ABNORMAL LOW (ref 3.87–5.11)

## 2011-07-18 LAB — LACTIC ACID, PLASMA: Lactic Acid, Venous: 0.8 mmol/L (ref 0.5–2.2)

## 2011-07-18 LAB — PROTIME-INR: INR: 3.06 — ABNORMAL HIGH (ref 0.00–1.49)

## 2011-07-18 LAB — OSMOLALITY: Osmolality: 285 mOsm/kg (ref 275–300)

## 2011-07-18 MED ORDER — IOHEXOL 300 MG/ML  SOLN
100.0000 mL | Freq: Once | INTRAMUSCULAR | Status: AC | PRN
  Administered 2011-07-18: 100 mL via INTRAVENOUS

## 2011-07-18 MED ORDER — CIPROFLOXACIN IN D5W 400 MG/200ML IV SOLN
400.0000 mg | Freq: Two times a day (BID) | INTRAVENOUS | Status: DC
  Administered 2011-07-18 – 2011-07-21 (×7): 400 mg via INTRAVENOUS
  Filled 2011-07-18 (×9): qty 200

## 2011-07-18 MED ORDER — ONDANSETRON HCL 4 MG PO TABS: 4.0000 mg | ORAL_TABLET | Freq: Four times a day (QID) | ORAL | Status: DC | PRN

## 2011-07-18 MED ORDER — SODIUM CHLORIDE 0.9 % IV SOLN
20.0000 mL | INTRAVENOUS | Status: AC
  Administered 2011-07-18: 20 mL via INTRAVENOUS

## 2011-07-18 MED ORDER — HYDROCODONE-ACETAMINOPHEN 5-325 MG PO TABS: 1.0000 | ORAL_TABLET | ORAL | Status: DC | PRN

## 2011-07-18 MED ORDER — ALBUTEROL SULFATE (5 MG/ML) 0.5% IN NEBU: 2.5000 mg | INHALATION_SOLUTION | RESPIRATORY_TRACT | Status: DC | PRN

## 2011-07-18 MED ORDER — POTASSIUM CHLORIDE 10 MEQ/100ML IV SOLN
10.0000 meq | INTRAVENOUS | Status: AC
  Administered 2011-07-18 (×2): 10 meq via INTRAVENOUS
  Filled 2011-07-18 (×2): qty 100

## 2011-07-18 MED ORDER — ONDANSETRON HCL 4 MG/2ML IJ SOLN
4.0000 mg | Freq: Four times a day (QID) | INTRAMUSCULAR | Status: DC | PRN
  Administered 2011-07-18 (×3): 4 mg via INTRAVENOUS
  Filled 2011-07-18 (×3): qty 2

## 2011-07-18 MED ORDER — METRONIDAZOLE IN NACL 5-0.79 MG/ML-% IV SOLN
500.0000 mg | Freq: Three times a day (TID) | INTRAVENOUS | Status: DC
  Administered 2011-07-18 – 2011-07-21 (×11): 500 mg via INTRAVENOUS
  Filled 2011-07-18 (×13): qty 100

## 2011-07-18 MED ORDER — VITAMIN K1 1 MG/0.5ML IJ SOLN
2.0000 mg | Freq: Once | INTRAMUSCULAR | Status: AC
  Administered 2011-07-18: 2 mg via INTRAVENOUS
  Filled 2011-07-18: qty 1

## 2011-07-18 MED ORDER — SODIUM CHLORIDE 0.9 % IV SOLN
INTRAVENOUS | Status: DC
  Administered 2011-07-18 – 2011-07-19 (×3): via INTRAVENOUS

## 2011-07-18 MED ORDER — CLONAZEPAM 0.5 MG PO TABS
0.5000 mg | ORAL_TABLET | Freq: Two times a day (BID) | ORAL | Status: DC
  Administered 2011-07-18 – 2011-07-23 (×9): 0.5 mg via ORAL
  Filled 2011-07-18 (×9): qty 1

## 2011-07-18 MED ORDER — PANTOPRAZOLE SODIUM 40 MG PO TBEC
40.0000 mg | DELAYED_RELEASE_TABLET | Freq: Every day | ORAL | Status: DC
Start: 2011-07-18 — End: 2011-07-23
  Administered 2011-07-18 – 2011-07-23 (×6): 40 mg via ORAL
  Filled 2011-07-18 (×6): qty 1

## 2011-07-18 MED ORDER — SODIUM CHLORIDE 0.9 % IV SOLN: 20.0000 mL | INTRAVENOUS | Status: DC

## 2011-07-18 MED ORDER — DIPHENHYDRAMINE HCL 50 MG/ML IJ SOLN: 25.0000 mg | Freq: Four times a day (QID) | INTRAMUSCULAR | Status: DC | PRN

## 2011-07-18 MED ORDER — METOPROLOL TARTRATE 1 MG/ML IV SOLN: 5.0000 mg | INTRAVENOUS | Status: DC | PRN

## 2011-07-18 MED ORDER — ESCITALOPRAM OXALATE 5 MG PO TABS
5.0000 mg | ORAL_TABLET | Freq: Every day | ORAL | Status: DC
  Administered 2011-07-18 – 2011-07-23 (×6): 5 mg via ORAL
  Filled 2011-07-18 (×6): qty 1

## 2011-07-18 MED ORDER — GUAIFENESIN-DM 100-10 MG/5ML PO SYRP
5.0000 mL | ORAL_SOLUTION | ORAL | Status: DC | PRN
  Filled 2011-07-18: qty 5

## 2011-07-18 NOTE — ED Notes (Signed)
MD at bedside. 

## 2011-07-18 NOTE — Consult Note (Signed)
Referring Provider: [facility] Primary Care Physician: Dr. Selena Batten  Primary Gastroenterologist:  none.  Reason for Consultation:  Abdominal  pain, and GI  bleeding  HPI: Emily Wyatt is a 77 y.o. female with hx of chronic atrial fibrillation,on chronic cooumadin.S He has hx of diverticulosis, and rectal prolapse. She underwent colonoscopy  In 2010 with Dr Virginia Rochester showing diverticulosis throughout the colon and two ulcers at 25, and 30 cm felt associated with prolapse. She then had a sigmoid colectomy and rectopexy with Dr Daphine Deutscher . She has had admission since for bowel obstruction, and  A sigmoid volvulus in 2011. This was confirmed by colonoscopy by Dr Elnoria Howard, and ultimately she transferred to [facility] for surgical  Repair. She presents now with a two day hx of lower abdominal pain, soreness. She says she just has not felt well. Last pm about 7 she had a Bm which was grossly bloody, associated with cramping, then apparently had several other bloody stools prior to coming to the Er.  She feels better this am, less soreness, she has not had any further stools  Past Medical History  Diagnosis Date  . Diverticulitis   . Atrial fibrillation   . LGI bleed   . Anticoagulant long-term use   . Anemia due to blood loss   . Rectal prolapse   . Diverticulosis of colon     History reviewed. No pertinent past surgical history.  Prior to Admission medications   Medication Sig Start Date End Date Taking? Authorizing Provider  clonazePAM (KLONOPIN) 0.5 MG tablet Take 0.5 mg by mouth daily as needed. anxiety    Yes Historical Provider, MD  escitalopram (LEXAPRO) 5 MG tablet Take 5 mg by mouth daily.     Yes Historical Provider, MD  warfarin (COUMADIN) 2.5 MG tablet Take 5 mg by mouth daily.     Yes Historical Provider, MD    Current Facility-Administered Medications  Medication Dose Route Frequency Provider Last Rate Last Dose  . 0.9 %  sodium chloride infusion  20 mL Intravenous Continuous Leroy Sea, MD 50 mL/hr at 07/18/11 0305 20 mL at 07/18/11 0305  . albuterol (PROVENTIL) (5 MG/ML) 0.5% nebulizer solution 2.5 mg  2.5 mg Nebulization Q2H PRN Leroy Sea, MD      . ciprofloxacin (CIPRO) IVPB 400 mg  400 mg Intravenous Q12H Leroy Sea, MD   400 mg at 07/18/11 0131  . clonazePAM (KLONOPIN) tablet 0.5 mg  0.5 mg Oral BID Leroy Sea, MD   0.5 mg at 07/18/11 1044  . diphenhydrAMINE (BENADRYL) injection 25 mg  25 mg Intravenous Q6H PRN Leroy Sea, MD      . escitalopram (LEXAPRO) tablet 5 mg  5 mg Oral Daily Leroy Sea, MD   5 mg at 07/18/11 1045  . guaiFENesin-dextromethorphan (ROBITUSSIN DM) 100-10 MG/5ML syrup 5 mL  5 mL Oral Q4H PRN Leroy Sea, MD      . HYDROcodone-acetaminophen (NORCO) 5-325 MG per tablet 1-2 tablet  1-2 tablet Oral Q4H PRN Leroy Sea, MD      . iohexol (OMNIPAQUE) 300 MG/ML solution 100 mL  100 mL Intravenous Once PRN Medication Radiologist   100 mL at 07/18/11 0254  . metoprolol (LOPRESSOR) injection 5 mg  5 mg Intravenous Q4H PRN Leroy Sea, MD      . metroNIDAZOLE (FLAGYL) IVPB 500 mg  500 mg Intravenous Q8H Leroy Sea, MD   500 mg at 07/18/11 0855  . ondansetron (ZOFRAN)  tablet 4 mg  4 mg Oral Q6H PRN Leroy Sea, MD       Or  . ondansetron (ZOFRAN) injection 4 mg  4 mg Intravenous Q6H PRN Leroy Sea, MD   4 mg at 07/18/11 0803  . pantoprazole (PROTONIX) EC tablet 40 mg  40 mg Oral Q1200 Leroy Sea, MD   40 mg at 07/18/11 0042  . phytonadione (VITAMIN K) 1 MG/0.5ML 2 mg in dextrose 5 % 50 mL IVPB  2 mg Intravenous Once Leroy Sea, MD   2 mg at 07/18/11 0315  . sodium chloride 0.9 % bolus 500 mL  500 mL Intravenous Once Olivia Mackie, MD   500 mL at 07/17/11 2333  . DISCONTD: 0.9 %  sodium chloride infusion  20 mL Intravenous Continuous Olivia Mackie, MD 20 mL/hr at 07/17/11 2243 20 mL at 07/17/11 2243  . DISCONTD: 0.9 %  sodium chloride infusion  20 mL Intravenous Continuous Leroy Sea, MD       Current Outpatient Prescriptions  Medication Sig Dispense Refill  . clonazePAM (KLONOPIN) 0.5 MG tablet Take 0.5 mg by mouth daily as needed. anxiety       . escitalopram (LEXAPRO) 5 MG tablet Take 5 mg by mouth daily.        Marland Kitchen warfarin (COUMADIN) 2.5 MG tablet Take 5 mg by mouth daily.          Allergies as of 07/17/2011 - Review Complete 07/17/2011  Allergen Reaction Noted  . Codeine  07/17/2011    History reviewed. No pertinent family history.  History   Social History  . Marital Status: Widowed    Spouse Name: N/A    Number of Children: N/A  . Years of Education: N/A   Occupational History  . Not on file.   Social History Main Topics  . Smoking status: Not on file  . Smokeless tobacco: Not on file  . Alcohol Use:   . Drug Use:   . Sexually Active:    Other Topics Concern  . Not on file   Social History Narrative  . No narrative on file    Review of Systems: Pertinent positive and negative review of systems were noted in the above HPI section.  All other review of systems was otherwise negative.  Physical Exam: Vital signs in last 24 hours: Temp:  [97.8 F (36.6 C)-99.1 F (37.3 C)] 98.7 F (37.1 C) (12/15 1004) Pulse Rate:  [63-98] 80  (12/15 1004) Resp:  [18-20] 18  (12/15 0631) BP: (95-150)/(46-71) 130/58 mmHg (12/15 1004) SpO2:  [93 %-98 %] 95 % (12/15 1004) Weight:  [56.7 kg (125 lb)] 125 lb (56.7 kg) (12/14 2136)   General:   Alert,  Well-developed, well-nourished,elderly, pleasant and cooperative in NAD Head:  Normocephalic and atraumatic. Eyes:  Sclera clear, no icterus.   Conjunctiva pink. Ears:  Normal auditory acuity. Nose:  No deformity, discharge,  or lesions. Mouth:  No deformity or lesions.   Neck:  Supple; no masses or thyromegaly. Lungs:  Clear throughout to auscultation.   No wheezes, crackles, or rhonchi. Heart:  irrRegular rate and rhythm; no murmurs, clicks, rubs,  or gallops. Abdomen:  Soft, BS+ somewhat  distended, tympanitic, mildly tender lower abdomen, no guarding, no rebound, no mass Rectal ;not done Msk:  Symmetrical without gross deformities. . Pulses:  Normal pulses noted. Extremities:  Without clubbing or edema. Neurologic:  Alert and  oriented x4;  grossly normal neurologically. Skin:  Intact  without significant lesions or rashes.. Psych:  Alert and cooperative. Normal mood and affect, mild confusion to events  Intake/Output from previous day: 12/14 0701 - 12/15 0700 In: 325 [Blood:325] Out: -  Intake/Output this shift: Total I/O In: 650 [Blood:650] Out: -   Lab Results:  Basename 07/18/11 0855 07/18/11 0625 07/17/11 2208  WBC -- 10.2 13.6*  HGB 9.4* 9.0* 9.9*  HCT 27.1* 26.2* 28.7*  PLT -- 184 184   BMET  Basename 07/18/11 0625 07/17/11 2208  NA 132* 129*  K 3.3* 4.0  CL 98 98  CO2 24 24  GLUCOSE 99 159*  BUN 15 21  CREATININE 0.58 0.74  CALCIUM 9.3 9.0   LFT  Basename 07/17/11 2208  PROT 6.0  ALBUMIN 3.3*  AST 18  ALT 9  ALKPHOS 68  BILITOT 1.0  BILIDIR --  IBILI --   PT/INR  Basename 07/18/11 0001  LABPROT 32.1*  INR 3.06*       Studies/Results: Ct Abdomen Pelvis W Contrast  07/18/2011  *RADIOLOGY REPORT*  Clinical Data: Lower GI bleed  CT ABDOMEN AND PELVIS WITH CONTRAST  Technique:  Multidetector CT imaging of the abdomen and pelvis was performed following the standard protocol during bolus administration of intravenous contrast.  Contrast: OMNIPAQUE IOHEXOL 300 MG/ML IV SOLN  Comparison: 07/18/2011 radiograph, 11/10/2009 CT  Findings: Bibasilar scarring.  Coronary artery calcification.  Marked intra and extrahepatic biliary ductal dilatation to the level of the ampulla, similar to prior.  There is main pancreatic ductal dilatation, measuring up to 6 mm.  This is also similar to prior.  Status post cholecystectomy.  No focal liver lesion. Unremarkable spleen and adrenal glands.  Bilateral renal hypodensities are too small to further  characterize and a exophytic cyst off the lower pole right kidney.  And colonic diverticulosis. There is acute diverticulitis involving the the distal ileum. There are mildly prominent loops of small bowel proximal to this point, with air-fluid levels.  No free intraperitoneal air or fluid.  There is scattered atherosclerotic calcification of the aorta and its branches. No aneurysmal dilatation.  Post hysterectomy.  Thin-walled bladder.  Pelvic floor laxity. Filling defect within a prominent anterior vessel contributing to the right common femoral vein.  No aggressive osseous lesion.  Stable cystic changes of the sacrum. Compression fracture of T12 and multilevel degenerative disc disease.  IMPRESSION: Inflammatory changes involving a focal segment of ileum is favored to represent acute ileal diverticulitis.  There are mildly distended proximal loops of small bowel which may be secondary to delayed transit.  Recommend follow-up radiograph to document contrast passage into the colon. An alternative consideration based on the imaging would be an early small bowel obstruction pattern with inflammatory or early ischemic changes involving this segment. Therefore, correlation with clinical symptoms and presentation recommend.  Discussed via telephone with Colleen Can at 03:15 a.m. on 07/18/2011.  Original Report Authenticated By: Waneta Martins, M.D.   Dg Abd Portable 1v  07/18/2011  *RADIOLOGY REPORT*  Clinical Data: Rectal bleeding  ABDOMEN - 1 VIEW  Comparison: 11/10/2009 CT  Findings: Nonobstructive bowel gas pattern.  Hemidiaphragms excluded from the image.  Osteopenia.  Multiple pelvic phlebolith. Vascular calcifications.  Surgical clips right upper quadrant. Cannot evaluate for free intraperitoneal air on this portable supine radiograph.  IMPRESSION: Nonobstructive bowel gas pattern.  Original Report Authenticated By: Waneta Martins, M.D.    Impression: #1  77 yo female with acute Gi bleed  consistent with lower Gi bleed in setting of  anticoagulation, and 2 day hx of lower abdominal pain. Ct shows an area of probable diverticulitis  In the ileum ? Vs ischemia. #2 hx of diverticulitis,  #3 Hx of Sigmoid volvulus , and repair 2011 #4 hx of sigmoid resection and rectopexy for prolapse 2010 #5 anemia, acute secondary to GI bleed  #6 Hx of atrial fibrillation Plan: Agree with correction of INR, transusions to keep hg 8-9 range Continue IV  Cipro, and Flagyl. Lactate level, and serial exams Clear liquid sips.Will follow   Amy Esterwood  07/18/2011, 10:54 AM  Chart was reviewed and patient was examined. X-rays were reviewed.    I agree with management and plans.  Pt has an acute inflammatory process involving either the terminal ileum or proximal colon.  Bleeding likely is exacerbated by coagulopathy.  I favor diverticulities over ischemia but, for now, Rx is the same.  Barbette Hair. Arlyce Dice, M.D., [facility]

## 2011-07-18 NOTE — H&P (Addendum)
Emily Wyatt CSN:[id number],MRN:[id number]  Outpatient Primary MD for the patient is No primary provider on file.  With History of -  Past Medical History  Diagnosis Date  . Diverticulitis   . Atrial fibrillation   . LGI bleed   . Anticoagulant long-term use   . Anemia due to blood loss   . Rectal prolapse   . Diverticulosis of colon       No past surgical history on file.  in for   Chief Complaint  Patient presents with  . Rectal Bleeding     HPI  Emily Wyatt  is a 77 y.o. female, with H/O Diverticulosis who is on Coumadin for A.Fib, comes in after having some LLQ Abd pain-dull-nonradiaitng with no +/- factors for 1-2 days, today around 7pm went to have a BM but had large Red Blood come out instead, felt weak and was brought to the ER, I was called to admit the patient. Currently relatively symptom free except mild LLQ pain, not dizzy, no SOB, no Chest pain.   Review of Systems    In addition to the HPI above,    No Fever-chills, No Headache, No changes with Vision or hearing, No problems swallowing food or Liquids, No Chest pain, Cough or Shortness of Breath, +ve LLQ Abdominal pain, No Nausea or Vommitting, Bowel movements are regular, No Blood in  Urine, No dysuria, No new skin rashes or bruises, No new joints pains-aches,  No new weakness, tingling, numbness in any extremity, No recent weight gain or loss, No polyuria, polydypsia or polyphagia, No significant Mental Stressors.  A full 10 point Review of Systems was done, except as stated above, all other Review of Systems were negative.   Social History History  Substance Use Topics  . Smoking status: Not on file  . Smokeless tobacco: Not on file  . Alcohol Use:      Family History Stomach CA in sister  Prior to Admission medications   Medication Sig Start Date End Date Taking? Authorizing Provider  clonazePAM (KLONOPIN) 0.5 MG tablet Take 0.5 mg by mouth daily as needed. anxiety    Yes Historical  Provider, MD  escitalopram (LEXAPRO) 5 MG tablet Take 5 mg by mouth daily.     Yes Historical Provider, MD  warfarin (COUMADIN) 2.5 MG tablet Take 5 mg by mouth daily.     Yes Historical Provider, MD    Allergies  Allergen Reactions  . Codeine     Physical Exam No intake or output data in the 24 hours ending 07/18/11 0019 Blood pressure 110/53, pulse 76, temperature 98.1 F (36.7 C), temperature source Oral, height 5\' 6"  (1.676 m), weight 56.7 kg (125 lb), SpO2 95.00%.  1. General frail elderly female lying in bed in NAD,     2. Normal affect and insight, Not Suicidal or Homicidal, Awake Alert, Oriented *3.  3. No F.N deficits, ALL C.Nerves Intact, Strength 5/5 all 4 extremities, Sensation intact all 4 extremities, Plantars down going.  4. Ears and Eyes appear Normal, Conjunctivae clear, PERRLA. Moist Oral Mucosa.  5. Supple Neck, No JVD, No cervical lymphadenopathy appriciated, No Carotid Bruits.  6. Symmetrical Chest wall movement, Good air movement bilaterally, CTAB.  7. RRR, No Gallops, Rubs or Murmurs, No Parasternal Heave.  8. Positive Bowel Sounds, Abdomen Soft, LLQ mildly tender, No organomegaly appriciated,  No rebound -guarding or rigidity.  9.  No Cyanosis, Normal Skin Turgor, No Skin Rash , multiple leg Bruises.  10. Good muscle tone,  joints  appear normal , no effusions, Normal ROM.  11. No Palpable Lymph Nodes in Neck or Axillae     Data Review  CBC  Lab 07/17/11 2208  WBC 13.6*  HGB 9.9*  HCT 28.7*  PLT 184  MCV 94.1  MCH 32.5  MCHC 34.5  RDW 13.2  LYMPHSABS 0.8  MONOABS 1.1*  EOSABS 0.0  BASOSABS 0.0  BANDABS --   ------------------------------------------------------------------------------------------------------------------ Chemistries   Lab 07/17/11 2208  NA 129*  K 4.0  CL 98  CO2 24  GLUCOSE 159*  BUN 21  CREATININE 0.74  CALCIUM 9.0  MG --  AST 18  ALT 9  ALKPHOS 68  BILITOT 1.0    ------------------------------------------------------------------------------------------------------------------ estimated creatinine clearance is 48.5 ml/min (by C-G formula based on Cr of 0.74).   Imaging results:   My personal review of EKG: Rhythm NSR, Rate  62/min, QTc 435 , no Acute ST changes   Assessment & Plan  1. LGI Bleed due to clinical Diverticulitis + Anticoagulation - will admit to step down, check INR & CT ABd-Pelvis, will need FFPs 3-4 units based on INR, will give 2mg  IV Vit K, Hold Coumadin, follow INR and H&H, transfuse PRBC if needed, type screen done, IV Cipro-Flagyl and PPI, D/W GI Dr Arlyce Dice agrees with the plan will see in am.  2. Diverticulitis - CT, clears, IVF, IV ABX, D/W GI Dr Arlyce Dice.  3. Afib - IN NSR, PRN IV lopressor, not on any R.Cont agents at home, Tele, PRN IV Lopressor .Hold Coumadin. ? Need in the future already had a few falls in the past-bruises on skin - defer to PCP on longterm use.  4. Hyponatremia - gentle hydration, check Ur and Ser Osm and Ur Na.BMP in am.  5.Anemia due to LGI Bleed - as #1.  DVT Prophylaxis  SCDs    AM Labs Ordered, also please review Full Orders Admission, patients condition and plan of care including tests being ordered have been discussed with the patient and daughter who indicate understanding and agree with the plan and Code Status.  Code Status Full  Condition GUARDED   Leroy Sea M.D 07/18/2011, 12:19 AM  [facility] Office  [phone]

## 2011-07-18 NOTE — ED Notes (Signed)
Patient transported to CT 

## 2011-07-18 NOTE — ED Notes (Signed)
CT notified that pt has completed contrast °

## 2011-07-18 NOTE — Progress Notes (Signed)
Subjective: Pt feeling better, no bloody bowel movement. No nausea or vomiting. abd pain improved.   Objective: Weight change:   Intake/Output Summary (Last 24 hours) at 07/18/11 1144 Last data filed at 07/18/11 1126  Gross per 24 hour  Intake   1845 ml  Output    100 ml  Net   1745 ml  Exam She is alert afebrile and comfortable CVS: RRR, Lungs: clear Abdomen: mild tenderness in the lower quadrant. Bowel sounds heard, soft. Extremities: bruise on the right leg near the ankle. No pedal edema . Chronic venous stasis changes and varicosities.   Lab Results: Results for orders placed during the hospital encounter of 07/17/11 (from the past 24 hour(s))  CBC     Status: Abnormal   Collection Time   07/17/11 10:08 PM      Component Value Range   WBC 13.6 (*) 4.0 - 10.5 (K/uL)   RBC 3.05 (*) 3.87 - 5.11 (MIL/uL)   Hemoglobin 9.9 (*) 12.0 - 15.0 (g/dL)   HCT 16.1 (*) 09.6 - 46.0 (%)   MCV 94.1  78.0 - 100.0 (fL)   MCH 32.5  26.0 - 34.0 (pg)   MCHC 34.5  30.0 - 36.0 (g/dL)   RDW 04.5  40.9 - 81.1 (%)   Platelets 184  150 - 400 (K/uL)  DIFFERENTIAL     Status: Abnormal   Collection Time   07/17/11 10:08 PM      Component Value Range   Neutrophils Relative 86 (*) 43 - 77 (%)   Lymphocytes Relative 6 (*) 12 - 46 (%)   Monocytes Relative 8  3 - 12 (%)   Eosinophils Relative 0  0 - 5 (%)   Basophils Relative 0  0 - 1 (%)   Neutro Abs 11.7 (*) 1.7 - 7.7 (K/uL)   Lymphs Abs 0.8  0.7 - 4.0 (K/uL)   Monocytes Absolute 1.1 (*) 0.1 - 1.0 (K/uL)   Eosinophils Absolute 0.0  0.0 - 0.7 (K/uL)   Basophils Absolute 0.0  0.0 - 0.1 (K/uL)   Smear Review MORPHOLOGY UNREMARKABLE    COMPREHENSIVE METABOLIC PANEL     Status: Abnormal   Collection Time   07/17/11 10:08 PM      Component Value Range   Sodium 129 (*) 135 - 145 (mEq/L)   Potassium 4.0  3.5 - 5.1 (mEq/L)   Chloride 98  96 - 112 (mEq/L)   CO2 24  19 - 32 (mEq/L)   Glucose, Bld 159 (*) 70 - 99 (mg/dL)   BUN 21  6 - 23 (mg/dL)   Creatinine, Ser 9.14  0.50 - 1.10 (mg/dL)   Calcium 9.0  8.4 - 78.2 (mg/dL)   Total Protein 6.0  6.0 - 8.3 (g/dL)   Albumin 3.3 (*) 3.5 - 5.2 (g/dL)   AST 18  0 - 37 (U/L)   ALT 9  0 - 35 (U/L)   Alkaline Phosphatase 68  39 - 117 (U/L)   Total Bilirubin 1.0  0.3 - 1.2 (mg/dL)   GFR calc non Af Amer 77 (*) >90 (mL/min)   GFR calc Af Amer 89 (*) >90 (mL/min)  PROTIME-INR     Status: Abnormal   Collection Time   07/18/11 12:01 AM      Component Value Range   Prothrombin Time 32.1 (*) 11.6 - 15.2 (seconds)   INR 3.06 (*) 0.00 - 1.49   OSMOLALITY     Status: Normal   Collection Time   07/18/11 12:01 AM  Component Value Range   Osmolality 285  275 - 300 (mOsm/kg)  TYPE AND SCREEN     Status: Normal   Collection Time   07/18/11 12:11 AM      Component Value Range   ABO/RH(D) O NEG     Antibody Screen NEG     Sample Expiration 07/21/2011    PREPARE FRESH FROZEN PLASMA     Status: Normal (Preliminary result)   Collection Time   07/18/11 12:30 AM      Component Value Range   Unit Number [id number]     Blood Component Type THAWED PLASMA     Unit division 00     Status of Unit ISSUED     Transfusion Status OK TO TRANSFUSE     Unit Number [id number]     Blood Component Type THAWED PLASMA     Unit division 00     Status of Unit ISSUED     Transfusion Status OK TO TRANSFUSE     Unit Number [id number]     Blood Component Type THAWED PLASMA     Unit division 00     Status of Unit ISSUED     Transfusion Status OK TO TRANSFUSE    URINALYSIS, ROUTINE W REFLEX MICROSCOPIC     Status: Abnormal   Collection Time   07/18/11  1:44 AM      Component Value Range   Color, Urine YELLOW  YELLOW    APPearance CLOUDY (*) CLEAR    Specific Gravity, Urine 1.011  1.005 - 1.030    pH 7.0  5.0 - 8.0    Glucose, UA NEGATIVE  NEGATIVE (mg/dL)   Hgb urine dipstick LARGE (*) NEGATIVE    Bilirubin Urine NEGATIVE  NEGATIVE    Ketones, ur NEGATIVE  NEGATIVE (mg/dL)   Protein, ur NEGATIVE  NEGATIVE  (mg/dL)   Urobilinogen, UA 0.2  0.0 - 1.0 (mg/dL)   Nitrite NEGATIVE  NEGATIVE    Leukocytes, UA MODERATE (*) NEGATIVE   SODIUM, URINE, RANDOM     Status: Normal   Collection Time   07/18/11  1:44 AM      Component Value Range   Sodium, Ur 32    URINE MICROSCOPIC-ADD ON     Status: Abnormal   Collection Time   07/18/11  1:44 AM      Component Value Range   Squamous Epithelial / LPF FEW (*) RARE    WBC, UA 21-50  <3 (WBC/hpf)   RBC / HPF 0-2  <3 (RBC/hpf)   Bacteria, UA MANY (*) RARE   BASIC METABOLIC PANEL     Status: Abnormal   Collection Time   07/18/11  6:25 AM      Component Value Range   Sodium 132 (*) 135 - 145 (mEq/L)   Potassium 3.3 (*) 3.5 - 5.1 (mEq/L)   Chloride 98  96 - 112 (mEq/L)   CO2 24  19 - 32 (mEq/L)   Glucose, Bld 99  70 - 99 (mg/dL)   BUN 15  6 - 23 (mg/dL)   Creatinine, Ser 0.86  0.50 - 1.10 (mg/dL)   Calcium 9.3  8.4 - 57.8 (mg/dL)   GFR calc non Af Amer 84 (*) >90 (mL/min)   GFR calc Af Amer >90  >90 (mL/min)  CBC     Status: Abnormal   Collection Time   07/18/11  6:25 AM      Component Value Range   WBC 10.2  4.0 - 10.5 (K/uL)  RBC 2.79 (*) 3.87 - 5.11 (MIL/uL)   Hemoglobin 9.0 (*) 12.0 - 15.0 (g/dL)   HCT 16.1 (*) 09.6 - 46.0 (%)   MCV 93.9  78.0 - 100.0 (fL)   MCH 32.3  26.0 - 34.0 (pg)   MCHC 34.4  30.0 - 36.0 (g/dL)   RDW 04.5  40.9 - 81.1 (%)   Platelets 184  150 - 400 (K/uL)  LACTIC ACID, PLASMA     Status: Normal   Collection Time   07/18/11  6:25 AM      Component Value Range   Lactic Acid, Venous 0.8  0.5 - 2.2 (mmol/L)  HEMOGLOBIN AND HEMATOCRIT, BLOOD     Status: Abnormal   Collection Time   07/18/11  8:55 AM      Component Value Range   Hemoglobin 9.4 (*) 12.0 - 15.0 (g/dL)   HCT 91.4 (*) 78.2 - 46.0 (%)     Micro Results: No results found for this or any previous visit (from the past 240 hour(s)).  Studies/Results: Ct Abdomen Pelvis W Contrast  07/18/2011  *RADIOLOGY REPORT*  Clinical Data: Lower GI bleed  CT  ABDOMEN AND PELVIS WITH CONTRAST  Technique:  Multidetector CT imaging of the abdomen and pelvis was performed following the standard protocol during bolus administration of intravenous contrast.  Contrast: OMNIPAQUE IOHEXOL 300 MG/ML IV SOLN  Comparison: 07/18/2011 radiograph, 11/10/2009 CT  Findings: Bibasilar scarring.  Coronary artery calcification.  Marked intra and extrahepatic biliary ductal dilatation to the level of the ampulla, similar to prior.  There is main pancreatic ductal dilatation, measuring up to 6 mm.  This is also similar to prior.  Status post cholecystectomy.  No focal liver lesion. Unremarkable spleen and adrenal glands.  Bilateral renal hypodensities are too small to further characterize and a exophytic cyst off the lower pole right kidney.  And colonic diverticulosis. There is acute diverticulitis involving the the distal ileum. There are mildly prominent loops of small bowel proximal to this point, with air-fluid levels.  No free intraperitoneal air or fluid.  There is scattered atherosclerotic calcification of the aorta and its branches. No aneurysmal dilatation.  Post hysterectomy.  Thin-walled bladder.  Pelvic floor laxity. Filling defect within a prominent anterior vessel contributing to the right common femoral vein.  No aggressive osseous lesion.  Stable cystic changes of the sacrum. Compression fracture of T12 and multilevel degenerative disc disease.  IMPRESSION: Inflammatory changes involving a focal segment of ileum is favored to represent acute ileal diverticulitis.  There are mildly distended proximal loops of small bowel which may be secondary to delayed transit.  Recommend follow-up radiograph to document contrast passage into the colon. An alternative consideration based on the imaging would be an early small bowel obstruction pattern with inflammatory or early ischemic changes involving this segment. Therefore, correlation with clinical symptoms and presentation  recommend.  Discussed via telephone with Colleen Can at 03:15 a.m. on 07/18/2011.  Original Report Authenticated By: Waneta Martins, M.D.   Dg Abd Portable 1v  07/18/2011  *RADIOLOGY REPORT*  Clinical Data: Rectal bleeding  ABDOMEN - 1 VIEW  Comparison: 11/10/2009 CT  Findings: Nonobstructive bowel gas pattern.  Hemidiaphragms excluded from the image.  Osteopenia.  Multiple pelvic phlebolith. Vascular calcifications.  Surgical clips right upper quadrant. Cannot evaluate for free intraperitoneal air on this portable supine radiograph.  IMPRESSION: Nonobstructive bowel gas pattern.  Original Report Authenticated By: Waneta Martins, M.D.   Medications: Scheduled Meds:   . ciprofloxacin  400  mg Intravenous Q12H  . clonazePAM  0.5 mg Oral BID  . escitalopram  5 mg Oral Daily  . metronidazole  500 mg Intravenous Q8H  . pantoprazole  40 mg Oral Q1200  . phytonadione (VITAMIN K) IV  2 mg Intravenous Once  . sodium chloride  500 mL Intravenous Once   Continuous Infusions:   . sodium chloride 20 mL (07/18/11 0305)  . DISCONTD: sodium chloride 20 mL (07/17/11 2243)  . DISCONTD: sodium chloride     PRN Meds:.albuterol, diphenhydrAMINE, guaiFENesin-dextromethorphan, HYDROcodone-acetaminophen, iohexol, metoprolol, ondansetron (ZOFRAN) IV, ondansetron  Assessment/Plan: Patient Active Hospital Problem List:  Diverticulitis vs Ischemic Colitis and  lower GI Bleed in the setting of  Chronic anti coagulation for a fib..: H&H stable around 9. No more bloody bowel movements. On iv Cipro and flagyl. Dr Arlyce Dice to see the patient. On clear liquid diet.   Atrial fibrillation: rate controlled and in sinus currently. Iv prn metoprolol.  Anemia : secondary to blood loss. qill do H&H q12hrs.  UTI: already on cipro.   Hypokalemia: will be repleted as needed.   Plan : transfer pt to telemetry.  D/w the plan with the patient and family at bedside.   LOS: 1 day   Latasha Buczkowski 07/18/2011, 11:44  AM

## 2011-07-19 ENCOUNTER — Inpatient Hospital Stay (HOSPITAL_COMMUNITY): Payer: Medicare Other

## 2011-07-19 DIAGNOSIS — K5732 Diverticulitis of large intestine without perforation or abscess without bleeding: Secondary | ICD-10-CM

## 2011-07-19 DIAGNOSIS — K921 Melena: Secondary | ICD-10-CM

## 2011-07-19 LAB — PREPARE FRESH FROZEN PLASMA
Unit division: 0
Unit division: 0

## 2011-07-19 LAB — PREPARE RBC (CROSSMATCH)

## 2011-07-19 LAB — BASIC METABOLIC PANEL
Chloride: 103 mEq/L (ref 96–112)
Creatinine, Ser: 0.56 mg/dL (ref 0.50–1.10)
GFR calc Af Amer: >90 mL/min (ref >90)
Sodium: 135 mEq/L (ref 135–145)

## 2011-07-19 LAB — PROTIME-INR
INR: 1.09 (ref 0.00–1.49)
Prothrombin Time: 14.3 seconds (ref 11.6–15.2)

## 2011-07-19 NOTE — Progress Notes (Signed)
She still has a partial SBO.  Would continue clear liquids only.

## 2011-07-19 NOTE — Progress Notes (Signed)
Patient ID: Emily Wyatt, female   DOB: May 03, 1927, 77 y.o.   MRN: [id number] [facility] Progress Note  Subjective: She feels much better, denies any pain, and is begging to go home. She has not eaten, but says she just can't eat the clear liquids. No further Bm's or bleeding. She did have some vomiting last night per her daughter. Daughter also says she does not want her Mom to go back on coumadin. Objective:  Vital signs in last 24 hours: Temp:  [98.3 F (36.8 C)-99 F (37.2 C)] 98.6 F (37 C) (12/16 0530) Pulse Rate:  [72-89] 73  (12/16 0530) Resp:  [17-25] 20  (12/16 0530) BP: (128-160)/(58-83) 134/61 mmHg (12/16 0530) SpO2:  [94 %-95 %] 95 % (12/16 0530) Weight:  [56.4 kg (124 lb 5.4 oz)-56.7 kg (125 lb)] 124 lb 5.4 oz (56.4 kg) (12/16 0500)   General:   Alert,  Well-developed,elderly    in NAD,up in chair Heart: irr Regular rate and rhythm; no murmurs Pulm;clear Abdomen:  Soft, nontender and nondistended. Normal bowel sounds, Extremities:  Without edema. Neurologic:  Alert and  oriented x4;  grossly normal neurologically. Psych:  Alert and cooperative. Normal mood and affect.  Intake/Output from previous day: 12/15 0701 - 12/16 0700 In: 3410 [P.O.:360; I.V.:1350; Blood:1700] Out: 100 [Emesis/NG output:100] Intake/Output this shift:    Lab Results:  Basename 07/18/11 1610 07/18/11 0855 07/18/11 0625 07/17/11 2208  WBC -- -- 10.2 13.6*  HGB 8.3* 9.4* 9.0* --  HCT 23.9* 27.1* 26.2* --  PLT -- -- 184 184   BMET  Basename 07/19/11 0550 07/18/11 0625 07/17/11 2208  NA 135 132* 129*  K 3.6 3.3* 4.0  CL 103 98 98  CO2 25 24 24   GLUCOSE 108* 99 159*  BUN 11 15 21   CREATININE 0.56 0.58 0.74  CALCIUM 8.6 9.3 9.0   LFT  Basename 07/17/11 2208  PROT 6.0  ALBUMIN 3.3*  AST 18  ALT 9  ALKPHOS 68  BILITOT 1.0  BILIDIR --  IBILI --   PT/INR  Basename 07/19/11 0550 07/18/11 1610  LABPROT 14.3 15.8*  INR 1.09 1.23      Assessment / Plan: #1 77  yo female with acute lower Gi bleed , in setting of chronic anticoagulation. She is stable , InR corrected, and no further bleeding. Likely diverticular #2 Acute inflammatory process involving Ileum vs right colon again felt Diverticulitis. Improved on flagyl and Cipro. She  Did have an obstructive component. Will follow up abdominal films today, if improved advance to full liquids #3 Anemia- post hemorrhagic. Will transfuse one unit today. #4 Hx of atrial Fib- Pt should go home off coumadin, and follow up with her primary Dr. Selena Batten  To discuss   Resuming vs. Leaving off.  Principal Problem:  *LGI bleed Active Problems:  Diverticulosis of colon  Rectal prolapse  Anemia due to blood loss  Anticoagulant long-term use  Atrial fibrillation  Diverticulitis     LOS: 2 days   Nikky Duba  07/19/2011, 9:46 AM

## 2011-07-19 NOTE — Progress Notes (Signed)
Subjective: Pt comfortable. Nausea and vomiting.   Objective: Weight change: 0 kg (0 lb)  Intake/Output Summary (Last 24 hours) at 07/19/11 1650 Last data filed at 07/19/11 1315  Gross per 24 hour  Intake   1320 ml  Output      0 ml  Net   1320 ml   Exam  She is alert afebrile and comfortable  CVS: RRR,  Lungs: clear  Abdomen: mild tenderness in the right  lower quadrant. Bowel sounds heard, soft.  Extremities: bruise on the right leg near the ankle. No pedal edema . Chronic venous stasis changes and varicosities.   Lab Results: Results for orders placed during the hospital encounter of 07/17/11 (from the past 24 hour(s))  PROTIME-INR     Status: Normal   Collection Time   07/19/11  5:50 AM      Component Value Range   Prothrombin Time 14.3  11.6 - 15.2 (seconds)   INR 1.09  0.00 - 1.49   BASIC METABOLIC PANEL     Status: Abnormal   Collection Time   07/19/11  5:50 AM      Component Value Range   Sodium 135  135 - 145 (mEq/L)   Potassium 3.6  3.5 - 5.1 (mEq/L)   Chloride 103  96 - 112 (mEq/L)   CO2 25  19 - 32 (mEq/L)   Glucose, Bld 108 (*) 70 - 99 (mg/dL)   BUN 11  6 - 23 (mg/dL)   Creatinine, Ser 6.38  0.50 - 1.10 (mg/dL)   Calcium 8.6  8.4 - 75.6 (mg/dL)   GFR calc non Af Amer 85 (*) >90 (mL/min)   GFR calc Af Amer >90  >90 (mL/min)  PREPARE RBC (CROSSMATCH)     Status: Normal   Collection Time   07/19/11 10:30 AM      Component Value Range   Order Confirmation ORDER PROCESSED BY BLOOD BANK       Micro Results: No results found for this or any previous visit (from the past 240 hour(s)).  Studies/Results: Ct Abdomen Pelvis W Contrast  07/18/2011  *RADIOLOGY REPORT*  Clinical Data: Lower GI bleed  CT ABDOMEN AND PELVIS WITH CONTRAST  Technique:  Multidetector CT imaging of the abdomen and pelvis was performed following the standard protocol during bolus administration of intravenous contrast.  Contrast: OMNIPAQUE IOHEXOL 300 MG/ML IV SOLN  Comparison:  07/18/2011 radiograph, 11/10/2009 CT  Findings: Bibasilar scarring.  Coronary artery calcification.  Marked intra and extrahepatic biliary ductal dilatation to the level of the ampulla, similar to prior.  There is main pancreatic ductal dilatation, measuring up to 6 mm.  This is also similar to prior.  Status post cholecystectomy.  No focal liver lesion. Unremarkable spleen and adrenal glands.  Bilateral renal hypodensities are too small to further characterize and a exophytic cyst off the lower pole right kidney.  And colonic diverticulosis. There is acute diverticulitis involving the the distal ileum. There are mildly prominent loops of small bowel proximal to this point, with air-fluid levels.  No free intraperitoneal air or fluid.  There is scattered atherosclerotic calcification of the aorta and its branches. No aneurysmal dilatation.  Post hysterectomy.  Thin-walled bladder.  Pelvic floor laxity. Filling defect within a prominent anterior vessel contributing to the right common femoral vein.  No aggressive osseous lesion.  Stable cystic changes of the sacrum. Compression fracture of T12 and multilevel degenerative disc disease.  IMPRESSION: Inflammatory changes involving a focal segment of ileum is favored  to represent acute ileal diverticulitis.  There are mildly distended proximal loops of small bowel which may be secondary to delayed transit.  Recommend follow-up radiograph to document contrast passage into the colon. An alternative consideration based on the imaging would be an early small bowel obstruction pattern with inflammatory or early ischemic changes involving this segment. Therefore, correlation with clinical symptoms and presentation recommend.  Discussed via telephone with Colleen Can at 03:15 a.m. on 07/18/2011.  Original Report Authenticated By: Waneta Martins, M.D.   Dg Abd 2 Views  07/19/2011  *RADIOLOGY REPORT*  Clinical Data: Follow up partial small bowel obstruction.  ABDOMEN -  2 VIEW  Comparison: 07/18/2011  Findings:  Again identified are multiple abnormally dilated loops of small bowel. The degree of small bowel distention is unchanged.  There has been antegrade progression of enteric contrast material from the small bowel into the colon.  On the upright images there are multiple colonic fluid levels.  IMPRESSION:  1.  Partial small bowel obstruction.  Similar to previous exam.  Original Report Authenticated By: Rosealee Albee, M.D.   Dg Abd Portable 1v  07/18/2011  *RADIOLOGY REPORT*  Clinical Data: Rectal bleeding  ABDOMEN - 1 VIEW  Comparison: 11/10/2009 CT  Findings: Nonobstructive bowel gas pattern.  Hemidiaphragms excluded from the image.  Osteopenia.  Multiple pelvic phlebolith. Vascular calcifications.  Surgical clips right upper quadrant. Cannot evaluate for free intraperitoneal air on this portable supine radiograph.  IMPRESSION: Nonobstructive bowel gas pattern.  Original Report Authenticated By: Waneta Martins, M.D.   Medications: Scheduled Meds:   . ciprofloxacin  400 mg Intravenous Q12H  . clonazePAM  0.5 mg Oral BID  . escitalopram  5 mg Oral Daily  . metronidazole  500 mg Intravenous Q8H  . pantoprazole  40 mg Oral Q1200   Continuous Infusions:   . sodium chloride 50 mL/hr at 07/19/11 0133   PRN Meds:.albuterol, diphenhydrAMINE, guaiFENesin-dextromethorphan, HYDROcodone-acetaminophen, metoprolol, ondansetron (ZOFRAN) IV, ondansetron  Assessment/Plan:  LOS: 2 days  Diverticulitis vs Ischemic Colitis and lower GI Bleed in the setting of Chronic anti coagulation for a fib..: H&H stable between 8 to 9. No more bloody bowel movements. On iv Cipro and flagyl.  On clear liquid diet.  Atrial fibrillation: rate controlled and in sinus currently. Iv prn metoprolol.  Anemia : secondary to blood loss. qill do H&H q12hrs.  UTI: already on cipro.  Hypokalemia: repleted  D/w the plan with the patient and family at  bedside.   Safina Huard 07/19/2011, 4:50 PM

## 2011-07-20 ENCOUNTER — Inpatient Hospital Stay (HOSPITAL_COMMUNITY): Payer: Medicare Other

## 2011-07-20 DIAGNOSIS — K5732 Diverticulitis of large intestine without perforation or abscess without bleeding: Secondary | ICD-10-CM

## 2011-07-20 DIAGNOSIS — K921 Melena: Secondary | ICD-10-CM

## 2011-07-20 LAB — PROTIME-INR: Prothrombin Time: 14.4 seconds (ref 11.6–15.2)

## 2011-07-20 LAB — OCCULT BLOOD, POC DEVICE: Fecal Occult Bld: POSITIVE

## 2011-07-20 LAB — CBC
HCT: 27.2 % — ABNORMAL LOW (ref 36.0–46.0)
Hemoglobin: 9.4 g/dL — ABNORMAL LOW (ref 12.0–15.0)
MCH: 32 pg (ref 26.0–34.0)
MCHC: 34.6 g/dL (ref 30.0–36.0)

## 2011-07-20 NOTE — Progress Notes (Signed)
Subjective: She "feels fine," no abd pains, no nausea or vomiting.  Clear liquids offered but she was not interested, she would like to try more substantial food.  No further overt bleeding.  +flatus.   Objective: Vital signs in last 24 hours: Temp:  [98.3 F (36.8 C)-98.6 F (37 C)] 98.3 F (36.8 C) (12/17 0548) Pulse Rate:  [68-84] 68  (12/17 0548) Resp:  [18-22] 18  (12/17 0548) BP: (128-154)/(30-82) 148/61 mmHg (12/17 0548) SpO2:  [93 %-94 %] 93 % (12/17 0548) Weight:  [62 kg (136 lb 11 oz)] 136 lb 11 oz (62 kg) (12/17 0548)   General: alert and oriented times 3 Heart: regular rate and rythm Abdomen: soft, non-tender, non-distended, normal bowel sounds   Lab Results:  Basename 07/20/11 0436 07/19/11 1835 07/18/11 1610 07/18/11 0625 07/17/11 2208  WBC 4.8 -- -- 10.2 13.6*  HGB 9.4* 9.3* 8.3* -- --  PLT 216 -- -- 184 184  MCV 92.5 -- -- 93.9 94.1   BMET  Basename 07/19/11 0550 07/18/11 0625 07/17/11 2208  NA 135 132* 129*  K 3.6 3.3* 4.0  CL 103 98 98  CO2 25 24 24   GLUCOSE 108* 99 159*  BUN 11 15 21   CREATININE 0.56 0.58 0.74  CALCIUM 8.6 9.3 9.0   LFT  Basename 07/17/11 2208  PROT 6.0  ALBUMIN 3.3*  AST 18  ALT 9  ALKPHOS 68  BILITOT 1.0  BILIDIR --  IBILI --   PT/INR  Basename 07/20/11 0436 07/19/11 0550  INR 1.10 1.09  Dg Abd 2 Views  07/19/2011  *RADIOLOGY REPORT*  Clinical Data: Follow up partial small bowel obstruction.  ABDOMEN - 2 VIEW  Comparison: 07/18/2011  Findings:  Again identified are multiple abnormally dilated loops of small bowel. The degree of small bowel distention is unchanged.  There has been antegrade progression of enteric contrast material from the small bowel into the colon.  On the upright images there are multiple colonic fluid levels.  IMPRESSION:  1.  Partial small bowel obstruction.  Similar to previous exam.  Original Report Authenticated By: Rosealee Albee, M.D.      Assessment/Plan: 77 y.o. female improving abd  pains, resolving bleeding  CT scan showed possible distal ileum diverticulitis.  I reviewed the images and there are other ileal diverticulum and so this seems reasonable. She had colonsocopy about 2 years ago and since  She is improving clinically with Abx only, I don't think she needs repeat colonoscopy.    I will advance her diet.  HL IV.  If she does well another 24 hours then probably can go home with another 7 days abx.    Rob Bunting, MD  07/20/2011, 11:04 AM [facility] Pager [phone]

## 2011-07-20 NOTE — Progress Notes (Signed)
Subjective: Pt is comfortable, no nausea or vomiting,  Objective: Weight change: 5.3 kg (11 lb 11 oz)  Intake/Output Summary (Last 24 hours) at 07/20/11 1058 Last data filed at 07/20/11 1057  Gross per 24 hour  Intake 2162.5 ml  Output    400 ml  Net 1762.5 ml   Exam  She is alert afebrile and comfortable  CVS: RRR,  Lungs: clear  Abdomen:  Bowel sounds heard, soft. Non tender Extremities:  No pedal edema . Chronic venous stasis changes and varicosities.   Lab Results: Results for orders placed during the hospital encounter of 07/17/11 (from the past 24 hour(s))  HEMOGLOBIN AND HEMATOCRIT, BLOOD     Status: Abnormal   Collection Time   07/19/11  6:35 PM      Component Value Range   Hemoglobin 9.3 (*) 12.0 - 15.0 (g/dL)   HCT 16.1 (*) 09.6 - 46.0 (%)  PROTIME-INR     Status: Normal   Collection Time   07/20/11  4:36 AM      Component Value Range   Prothrombin Time 14.4  11.6 - 15.2 (seconds)   INR 1.10  0.00 - 1.49   CBC     Status: Abnormal   Collection Time   07/20/11  4:36 AM      Component Value Range   WBC 4.8  4.0 - 10.5 (K/uL)   RBC 2.94 (*) 3.87 - 5.11 (MIL/uL)   Hemoglobin 9.4 (*) 12.0 - 15.0 (g/dL)   HCT 04.5 (*) 40.9 - 46.0 (%)   MCV 92.5  78.0 - 100.0 (fL)   MCH 32.0  26.0 - 34.0 (pg)   MCHC 34.6  30.0 - 36.0 (g/dL)   RDW 81.1  91.4 - 78.2 (%)   Platelets 216  150 - 400 (K/uL)    Micro Results: No results found for this or any previous visit (from the past 240 hour(s)).  Studies/Results: Ct Abdomen Pelvis W Contrast  07/18/2011  *RADIOLOGY REPORT*  Clinical Data: Lower GI bleed  CT ABDOMEN AND PELVIS WITH CONTRAST  Technique:  Multidetector CT imaging of the abdomen and pelvis was performed following the standard protocol during bolus administration of intravenous contrast.  Contrast: OMNIPAQUE IOHEXOL 300 MG/ML IV SOLN  Comparison: 07/18/2011 radiograph, 11/10/2009 CT  Findings: Bibasilar scarring.  Coronary artery calcification.  Marked intra  and extrahepatic biliary ductal dilatation to the level of the ampulla, similar to prior.  There is main pancreatic ductal dilatation, measuring up to 6 mm.  This is also similar to prior.  Status post cholecystectomy.  No focal liver lesion. Unremarkable spleen and adrenal glands.  Bilateral renal hypodensities are too small to further characterize and a exophytic cyst off the lower pole right kidney.  And colonic diverticulosis. There is acute diverticulitis involving the the distal ileum. There are mildly prominent loops of small bowel proximal to this point, with air-fluid levels.  No free intraperitoneal air or fluid.  There is scattered atherosclerotic calcification of the aorta and its branches. No aneurysmal dilatation.  Post hysterectomy.  Thin-walled bladder.  Pelvic floor laxity. Filling defect within a prominent anterior vessel contributing to the right common femoral vein.  No aggressive osseous lesion.  Stable cystic changes of the sacrum. Compression fracture of T12 and multilevel degenerative disc disease.  IMPRESSION: Inflammatory changes involving a focal segment of ileum is favored to represent acute ileal diverticulitis.  There are mildly distended proximal loops of small bowel which may be secondary to delayed transit.  Recommend  follow-up radiograph to document contrast passage into the colon. An alternative consideration based on the imaging would be an early small bowel obstruction pattern with inflammatory or early ischemic changes involving this segment. Therefore, correlation with clinical symptoms and presentation recommend.  Discussed via telephone with Colleen Can at 03:15 a.m. on 07/18/2011.  Original Report Authenticated By: Waneta Martins, M.D.   Dg Abd 2 Views  07/19/2011  *RADIOLOGY REPORT*  Clinical Data: Follow up partial small bowel obstruction.  ABDOMEN - 2 VIEW  Comparison: 07/18/2011  Findings:  Again identified are multiple abnormally dilated loops of small bowel.  The degree of small bowel distention is unchanged.  There has been antegrade progression of enteric contrast material from the small bowel into the colon.  On the upright images there are multiple colonic fluid levels.  IMPRESSION:  1.  Partial small bowel obstruction.  Similar to previous exam.  Original Report Authenticated By: Rosealee Albee, M.D.   Dg Abd Portable 1v  07/18/2011  *RADIOLOGY REPORT*  Clinical Data: Rectal bleeding  ABDOMEN - 1 VIEW  Comparison: 11/10/2009 CT  Findings: Nonobstructive bowel gas pattern.  Hemidiaphragms excluded from the image.  Osteopenia.  Multiple pelvic phlebolith. Vascular calcifications.  Surgical clips right upper quadrant. Cannot evaluate for free intraperitoneal air on this portable supine radiograph.  IMPRESSION: Nonobstructive bowel gas pattern.  Original Report Authenticated By: Waneta Martins, M.D.   Medications: Scheduled Meds:   . ciprofloxacin  400 mg Intravenous Q12H  . clonazePAM  0.5 mg Oral BID  . escitalopram  5 mg Oral Daily  . metronidazole  500 mg Intravenous Q8H  . pantoprazole  40 mg Oral Q1200   Continuous Infusions:   . sodium chloride 50 mL/hr at 07/19/11 0806   PRN Meds:.albuterol, diphenhydrAMINE, guaiFENesin-dextromethorphan, HYDROcodone-acetaminophen, metoprolol, ondansetron (ZOFRAN) IV, ondansetron  Assessment/Plan:  LOS: 3 days  Diverticulitis vs Ischemic Colitis and lower GI Bleed in the setting of Chronic anti coagulation for a fib..: H&H stable between 8 to 9. No more bloody bowel movements. On iv Cipro and flagyl. Advanced diet, repeat abd x ray in am.  Atrial fibrillation: rate controlled and in sinus currently. Iv prn metoprolol.  Anemia : secondary to blood loss. Stable.  UTI: already on cipro. Unfortunately urine culture was not sent. Will send sample today.  Hypokalemia: repleted  Elaria Osias 07/20/2011, 10:58 AM

## 2011-07-20 NOTE — Progress Notes (Signed)
[facility] Progress Note  Subjective: No nausea. Consumed some clears for breakfast (doesn't care for diet). No hematochezia / no BMs.  Objective:  Vital signs in last 24 hours: Temp:  [98.3 F (36.8 C)-98.6 F (37 C)] 98.3 F (36.8 C) (12/17 0548) Pulse Rate:  [68-84] 68  (12/17 0548) Resp:  [18-22] 18  (12/17 0548) BP: (128-154)/(30-82) 148/61 mmHg (12/17 0548) SpO2:  [93 %-94 %] 93 % (12/17 0548) Weight:  [136 lb 11 oz (62 kg)] 136 lb 11 oz (62 kg) (12/17 0548)   General:   Alert,  well-developed white female in NAD Abdomen:  Soft, nontender and mildly distended. Normal bowel sounds.   Extremities:  Without edema. Extensive brusing RLE.  Neurologic:  Alert and  oriented x4;  grossly normal neurologically. Psych:  Alert and cooperative. Normal mood and affect.   Lab Results:  Basename 07/20/11 0436 August 13, 2011 1835 07/18/11 1610 07/18/11 0625 07/17/11 2208  WBC 4.8 -- -- 10.2 13.6*  HGB 9.4* 9.3* 8.3* -- --  HCT 27.2* 26.8* 23.9* -- --  PLT 216 -- -- 184 184   BMET  Basename 08-13-2011 0550 07/18/11 0625 07/17/11 2208  NA 135 132* 129*  K 3.6 3.3* 4.0  CL 103 98 98  CO2 25 24 24   GLUCOSE 108* 99 159*  BUN 11 15 21   CREATININE 0.56 0.58 0.74  CALCIUM 8.6 9.3 9.0   PT/INR  Basename 07/20/11 0436 08-13-11 0550  LABPROT 14.4 14.3  INR 1.10 1.09   Studies/Results: Dg Abd 2 Views  08-13-11  *RADIOLOGY REPORT*  Clinical Data: Follow up partial small bowel obstruction.  ABDOMEN - 2 VIEW  Comparison: 07/18/2011  Findings:  Again identified are multiple abnormally dilated loops of small bowel. The degree of small bowel distention is unchanged.  There has been antegrade progression of enteric contrast material from the small bowel into the colon.  On the upright images there are multiple colonic fluid levels.  IMPRESSION:  1.  Partial small bowel obstruction.  Similar to previous exam.  Original Report Authenticated By: Rosealee Albee, M.D.    Assessment /  Plan: 1.  77 yo female with acute lower Gi bleed requiring transfusion in setting of chronic anticoagulation. No further bleeding 2.  Anemia of acute blood loss, hemoglobin stable at 9.4. No further bleeding 3.  Acute inflammatory process involving Ileum vs right colon again felt Diverticulitis. Improved on flagyl and Cipro.  4.  SBO secondary to  #3. No further nausea. She wants to try something more substantial, will advance to fulls. 4.  Hx of atrial Fibrillation on chronic anticoagulation at home. INR reversed, now 1.1. She is off Coumadin.    LOS: 3 days   Willette Cluster  07/20/2011, 8:39 AM

## 2011-07-21 LAB — CBC
Hemoglobin: 9.9 g/dL — ABNORMAL LOW (ref 12.0–15.0)
MCH: 31.9 pg (ref 26.0–34.0)
MCV: 91.9 fL (ref 78.0–100.0)
RBC: 3.1 MIL/uL — ABNORMAL LOW (ref 3.87–5.11)

## 2011-07-21 LAB — BASIC METABOLIC PANEL
CO2: 25 mEq/L (ref 19–32)
Calcium: 8.6 mg/dL (ref 8.4–10.5)
Creatinine, Ser: 0.48 mg/dL — ABNORMAL LOW (ref 0.50–1.10)
Glucose, Bld: 114 mg/dL — ABNORMAL HIGH (ref 70–99)

## 2011-07-21 MED ORDER — POTASSIUM CHLORIDE CRYS ER 20 MEQ PO TBCR
40.0000 meq | EXTENDED_RELEASE_TABLET | ORAL | Status: AC
  Administered 2011-07-21 (×2): 40 meq via ORAL
  Filled 2011-07-21 (×2): qty 2

## 2011-07-21 MED ORDER — CIPROFLOXACIN HCL 500 MG PO TABS
500.0000 mg | ORAL_TABLET | Freq: Two times a day (BID) | ORAL | Status: DC
  Administered 2011-07-21 – 2011-07-23 (×5): 500 mg via ORAL
  Filled 2011-07-21 (×6): qty 1

## 2011-07-21 MED ORDER — LOPERAMIDE HCL 2 MG PO CAPS
2.0000 mg | ORAL_CAPSULE | ORAL | Status: DC | PRN
  Administered 2011-07-21: 2 mg via ORAL
  Filled 2011-07-21: qty 1

## 2011-07-21 MED ORDER — METRONIDAZOLE 500 MG PO TABS
500.0000 mg | ORAL_TABLET | Freq: Three times a day (TID) | ORAL | Status: DC
  Administered 2011-07-21 – 2011-07-23 (×7): 500 mg via ORAL
  Filled 2011-07-21 (×9): qty 1

## 2011-07-21 NOTE — Progress Notes (Signed)
Subjective: Low salt diet started yesterday.  Says she feels normal.  Ate a small bit of solid food yesterday but that is normal for her.  No nausea, no vomiting.  KUB yesterday showed possible ileus.  +flatus   Objective: Vital signs in last 24 hours: Temp:  [98.1 F (36.7 C)] 98.1 F (36.7 C) (12/18 0530) Pulse Rate:  [64-73] 69  (12/18 0530) Resp:  [16-18] 18  (12/18 0530) BP: (140-148)/(60-75) 145/60 mmHg (12/18 0530) SpO2:  [93 %-94 %] 94 % (12/18 0530) Weight:  [55.8 kg (123 lb 0.3 oz)] 123 lb 0.3 oz (55.8 kg) (12/18 0500) Last BM Date: 07/17/11 General: alert and oriented times 3 Heart: regular rate and rythm Abdomen: soft, non-tender, slightly distended, normal bowel sounds    Lab Results:  Basename 07/21/11 0445 07/20/11 0436 07/19/11 1835  WBC 5.3 4.8 --  HGB 9.9* 9.4* 9.3*  PLT 242 216 --  MCV 91.9 92.5 --   BMET  Basename 07/21/11 0445 07/19/11 0550  NA 134* 135  K 3.1* 3.6  CL 102 103  CO2 25 25  GLUCOSE 114* 108*  BUN 6 11  CREATININE 0.48* 0.56  CALCIUM 8.6 8.6    Imaging/Other results: Dg Abd 1 View  07/20/2011  *RADIOLOGY REPORT*  Clinical Data: Abdominal pain and distension, evaluate for small bowel obstruction  ABDOMEN - 1 VIEW  Comparison: 07/19/2011; 07/18/2011; abdominal CT - 07/18/2011  Findings:  There is grossly unchanged moderate gaseous distension of multiple loops of large and small bowel.  The previously ingested enteric contrast remains within the hepatic flexure and descending colon. Evaluation for pneumoperitoneum is limited secondary to exclusion of the lower thorax.  No definite pneumatosis or portal venous gas. Calcified phleboliths within the pelvis.  Limited visualization of the lower thorax demonstrates mild elevation of the right hemidiaphragm with bibasilar opacities, possibly atelectasis. Lumbar spine degenerative change.  IMPRESSION:  Grossly unchanged moderate gas distension of multiple loops of large and small bowel with  retained enteric contrast within the colon.  Findings are most suggestive of an ileus.  Original Report Authenticated By: Waynard Reeds, M.D.   Dg Abd 2 Views  07/19/2011  *RADIOLOGY REPORT*  Clinical Data: Follow up partial small bowel obstruction.  ABDOMEN - 2 VIEW  Comparison: 07/18/2011  Findings:  Again identified are multiple abnormally dilated loops of small bowel. The degree of small bowel distention is unchanged.  There has been antegrade progression of enteric contrast material from the small bowel into the colon.  On the upright images there are multiple colonic fluid levels.  IMPRESSION:  1.  Partial small bowel obstruction.  Similar to previous exam.  Original Report Authenticated By: Rosealee Albee, M.D.      Assessment/Plan: 77 y.o. female terminal ileum inflammation felt due to ileal diverticulitis.  She says she feels completely normal now.  OK to d/c home with 7 more days of PO abx.  Please call or page with any further questions or concerns.     Rob Bunting, MD  07/21/2011, 8:12 AM [facility] Pager [phone]

## 2011-07-21 NOTE — Progress Notes (Signed)
Subjective: "I feel ok" Denies pain/discomfort  Objective: Vital signs Filed Vitals:   07/20/11 1359 07/20/11 2141 07/21/11 0500 07/21/11 0530  BP: 148/66 140/75  145/60  Pulse: 64 73  69  Temp: 98.1 F (36.7 C) 98.1 F (36.7 C)  98.1 F (36.7 C)  TempSrc: Oral Oral  Oral  Resp: 16 18  18   Height:      Weight:   55.8 kg (123 lb 0.3 oz)   SpO2: 93% 94%  94%   Weight change: -6.2 kg (-13 lb 10.7 oz) Last BM Date: 07/17/11  Intake/Output from previous day: 12/17 0701 - 12/18 0700 In: 1774.2 [P.O.:480; I.V.:594.2; IV Piggyback:700] Out: -  Total I/O In: 460 [P.O.:360; IV Piggyback:100] Out: -    Physical Exam: General: Alert, awake, oriented x3, in no acute distress. Weak appearing but up in chair HEENT: No bruits, no goiter.PERRL EOMI Heart: Regular rate and rhythm, without murmurs, rubs, gallops. Lungs: Normal effort. Breath sounds clear to auscultation bilaterally. Abdomen: Soft, nontender, nondistended, positive bowel sounds. Extremities: No clubbing cyanosis or edema with positive pedal pulses. Neuro: Grossly intact, nonfocal.    Lab Results: Basic Metabolic Panel:  Basename 07/21/11 0445 07/19/11 0550  NA 134* 135  K 3.1* 3.6  CL 102 103  CO2 25 25  GLUCOSE 114* 108*  BUN 6 11  CREATININE 0.48* 0.56  CALCIUM 8.6 8.6  MG -- --  PHOS -- --   Liver Function Tests: No results found for this basename: AST:2,ALT:2,ALKPHOS:2,BILITOT:2,PROT:2,ALBUMIN:2 in the last 72 hours No results found for this basename: LIPASE:2,AMYLASE:2 in the last 72 hours No results found for this basename: AMMONIA:2 in the last 72 hours CBC:  Basename 07/21/11 0445 07/20/11 0436  WBC 5.3 4.8  NEUTROABS -- --  HGB 9.9* 9.4*  HCT 28.5* 27.2*  MCV 91.9 92.5  PLT 242 216   Cardiac Enzymes: No results found for this basename: CKTOTAL:3,CKMB:3,CKMBINDEX:3,TROPONINI:3 in the last 72 hours BNP: No components found with this basename: POCBNP:3 D-Dimer: No results found for this  basename: DDIMER:2 in the last 72 hours CBG: No results found for this basename: GLUCAP:6 in the last 72 hours Hemoglobin A1C: No results found for this basename: HGBA1C in the last 72 hours Fasting Lipid Panel: No results found for this basename: CHOL,HDL,LDLCALC,TRIG,CHOLHDL,LDLDIRECT in the last 72 hours Thyroid Function Tests: No results found for this basename: TSH,T4TOTAL,FREET4,T3FREE,THYROIDAB in the last 72 hours Anemia Panel: No results found for this basename: VITAMINB12,FOLATE,FERRITIN,TIBC,IRON,RETICCTPCT in the last 72 hours Coagulation:  Basename 07/21/11 0445 07/20/11 0436  LABPROT 14.9 14.4  INR 1.15 1.10   Urine Drug Screen: Drugs of Abuse  No results found for this basename: labopia, cocainscrnur, labbenz, amphetmu, thcu, labbarb    Alcohol Level: No results found for this basename: ETH:2 in the last 72 hours Urinalysis:  Misc. Labs:  No results found for this or any previous visit (from the past 240 hour(s)).  Studies/Results: Dg Abd 1 View  07/20/2011  *RADIOLOGY REPORT*  Clinical Data: Abdominal pain and distension, evaluate for small bowel obstruction  ABDOMEN - 1 VIEW  Comparison: 07/19/2011; 07/18/2011; abdominal CT - 07/18/2011  Findings:  There is grossly unchanged moderate gaseous distension of multiple loops of large and small bowel.  The previously ingested enteric contrast remains within the hepatic flexure and descending colon. Evaluation for pneumoperitoneum is limited secondary to exclusion of the lower thorax.  No definite pneumatosis or portal venous gas. Calcified phleboliths within the pelvis.  Limited visualization of the lower thorax demonstrates mild  elevation of the right hemidiaphragm with bibasilar opacities, possibly atelectasis. Lumbar spine degenerative change.  IMPRESSION:  Grossly unchanged moderate gas distension of multiple loops of large and small bowel with retained enteric contrast within the colon.  Findings are most suggestive of  an ileus.  Original Report Authenticated By: Waynard Reeds, M.D.    Medications: Scheduled Meds:   . ciprofloxacin  500 mg Oral BID  . clonazePAM  0.5 mg Oral BID  . escitalopram  5 mg Oral Daily  . metroNIDAZOLE  500 mg Oral Q8H  . pantoprazole  40 mg Oral Q1200  . potassium chloride  40 mEq Oral Q4H  . DISCONTD: ciprofloxacin  400 mg Intravenous Q12H  . DISCONTD: metronidazole  500 mg Intravenous Q8H   Continuous Infusions:  PRN Meds:.albuterol, diphenhydrAMINE, guaiFENesin-dextromethorphan, HYDROcodone-acetaminophen, metoprolol, ondansetron (ZOFRAN) IV, ondansetron  Assessment/Plan:  Principal Problem: 1. Diverticulitis vs Ischemic Colitis and lower GI Bleed in the setting of Chronic anti coagulation for a fib..: H&H stable between 8 to 9.Had a black tarry stool this am. On iv Cipro and flagyl. Will change to po. Diet advanced per GI. Will monitor closely  2.Atrial fibrillation: rate controlled and in sinus currently. Iv prn metoprolol. Has not needed prn med.  3.Anemia : secondary to blood loss. Stable.  4.UTI: already on cipro. Unfortunately urine culture was not sent. Will send sample today.  5.Hypokalemia: will replete and recheck. Will check mag level    LOS: 4 days   [facility] M 07/21/2011, 11:29 AM

## 2011-07-21 NOTE — Progress Notes (Signed)
Physical Therapy Evaluation Patient Details Name: Emily Wyatt MRN: [id number] DOB: [birth year] Today's Date: 07/21/2011 Time: 2952-8413  Eval II Problem List:  Patient Active Problem List  Diagnoses  . Diverticulosis of colon  . Rectal prolapse  . Anemia due to blood loss  . Anticoagulant long-term use  . Atrial fibrillation  . LGI bleed  . Diverticulitis    Past Medical History:  Past Medical History  Diagnosis Date  . Diverticulitis   . Atrial fibrillation   . LGI bleed   . Anticoagulant long-term use   . Anemia due to blood loss   . Rectal prolapse   . Diverticulosis of colon    Past Surgical History: History reviewed. No pertinent past surgical history.  PT Assessment/Plan/Recommendation PT Assessment Clinical Impression Statement: Pt presents with diagnosis of acute GI bleed. Pt will benefit from skilled PT in the cute care setting to improve gait, balance, and safety in preperation for D/C home. Discussed need for HHPT and RW for safety however pt reluctant to agree to PTs recommendtions. Family present-stated they can provide 24 hour assist if necessary. Feel pt is at increased risk for falls and will definitely need 24 hour care, at least initally. PT Recommendation/Assessment: Patient will need skilled PT in the acute care venue PT Problem List: Decreased strength;Decreased activity tolerance;Decreased balance;Decreased mobility;Decreased knowledge of use of DME;Decreased safety awareness;Decreased cognition Barriers to Discharge: Decreased caregiver support PT Therapy Diagnosis : Difficulty walking;Generalized weakness PT Plan PT Frequency: Min 3X/week PT Treatment/Interventions: DME instruction;Gait training;Stair training;Functional mobility training;Therapeutic activities;Balance training;Patient/family education PT Recommendation Recommendations for Other Services: OT consult Follow Up Recommendations: Home health PT;24 hour supervision/assistance Equipment  Recommended: Rolling walker with 5" wheels PT Goals  Acute Rehab PT Goals PT Goal Formulation: With patient Pt will go Supine/Side to Sit: with modified independence Pt will go Sit to Supine/Side: with modified independence Pt will go Sit to Stand: with supervision Pt will Ambulate: 51 - 150 feet;with least restrictive assistive device;with supervision  PT Evaluation Precautions/Restrictions  Precautions Precautions: Fall Prior Functioning  Home Living Lives With: Alone Receives Help From: Family Type of Home: House Home Layout: Two level;Able to live on main level with bedroom/bathroom Home Access: Stairs to enter Entrance Stairs-Number of Steps: 3 steps front and back-rail only at back entrance Bathroom Shower/Tub: Tub/shower unit [employer]: Standard Home Adaptive Equipment: None Prior Function Level of Independence: Independent with basic ADLs;Independent with gait Cognition Cognition Arousal/Alertness: Awake/alert Overall Cognitive Status: Appears within functional limits for tasks assessed Orientation Level: Oriented to place;Oriented to person Sensation/Coordination Sensation Light Touch: Appears Intact Coordination Gross Motor Movements are Fluid and Coordinated: Yes Extremity Assessment RLE Assessment RLE Assessment: Exceptions to [facility] RLE Strength RLE Overall Strength Comments: Strength > or = 3+/5 with functional activity LLE Assessment LLE Assessment: Exceptions to [facility] LLE Strength LLE Overall Strength Comments: Strength > or = 3+/5 with functional activity Mobility (including Balance) Bed Mobility Bed Mobility: No Transfers Transfers: Yes Sit to Stand: 4: Min assist Sit to Stand Details (indicate cue type and reason): VCs safety, hand placement. Assist to rise, stabilize. External assist to prevent fall.  Stand to Sit: 4: Min assist Stand to Sit Details: Uncontrolled descent. VCs safety, techique, hand placement.  Ambulation/Gait Ambulation/Gait:  Yes Ambulation/Gait Assistance: 4: Min assist Ambulation/Gait Assistance Details (indicate cue type and reason): VCs safety, Gait very unsteady and slow. Pt unable  to safely ambulate without external assist.  Ambulation Distance (Feet): 125 Feet. Pt c/o SOB with ambulation.  Assistive  device: None Gait Pattern: Step-through pattern  Posture/Postural Control Posture/Postural Control: No significant limitations Balance Balance Assessed: No (Requires external assist to prevent LOB) Exercise    End of Session PT - End of Session Equipment Utilized During Treatment: Gait belt Activity Tolerance: Patient limited by fatigue Patient left: in chair;with call bell in reach;with family/visitor present General Behavior During Session: [facility] for tasks performed Cognition: Impaired  [name] [facility] 07/21/2011, 4:58 PM [phone]

## 2011-07-22 LAB — TYPE AND SCREEN
ABO/RH(D): O NEG
Unit division: 0

## 2011-07-22 LAB — BASIC METABOLIC PANEL
BUN: 6 mg/dL (ref 6–23)
CO2: 27 mEq/L (ref 19–32)
Calcium: 9.3 mg/dL (ref 8.4–10.5)
GFR calc non Af Amer: 86 mL/min — ABNORMAL LOW (ref >90)
Glucose, Bld: 99 mg/dL (ref 70–99)
Sodium: 139 mEq/L (ref 135–145)

## 2011-07-22 LAB — CBC
HCT: 30.1 % — ABNORMAL LOW (ref 36.0–46.0)
Hemoglobin: 10.2 g/dL — ABNORMAL LOW (ref 12.0–15.0)
MCH: 31.5 pg (ref 26.0–34.0)
MCHC: 33.9 g/dL (ref 30.0–36.0)
RBC: 3.24 MIL/uL — ABNORMAL LOW (ref 3.87–5.11)

## 2011-07-22 LAB — PROTIME-INR: Prothrombin Time: 15.1 seconds (ref 11.6–15.2)

## 2011-07-22 NOTE — Progress Notes (Signed)
Physical Therapy Treatment Patient Details Name: Emily Wyatt MRN: [id number] DOB: Mar 08, 1927 Today's Date: 07/22/2011 Time: 1445-1510  2G PT Assessment/Plan  PT - Assessment/Plan Comments on Treatment Session: Pt's gait improved with use of RW however pt still reluctant to agree to use device after discharge. Continue to recommend 24 hour supervision/assist,  HHPT for balance training, and RW for ambulation PT Plan: Discharge plan remains appropriate Follow Up Recommendations: Home health PT;24 hour supervision/assistance Equipment Recommended: Rolling walker with 5" wheels PT Goals  Acute Rehab PT Goals PT Goal: Sit to Stand - Progress: Progressing toward goal PT Goal: Ambulate - Progress: Progressing toward goal  PT Treatment Precautions/Restrictions  Precautions Precautions: Fall Restrictions Weight Bearing Restrictions: No Mobility (including Balance) Bed Mobility Bed Mobility: No Transfers Transfers: Yes Sit to Stand: From chair/3-in-1;With upper extremity assist;With armrests Sit to Stand Details (indicate cue type and reason): Min-guard assist. Requires multiple attempts to rise. VCs safety, hand placement.  Stand to Sit: 4: Min assist;To chair/3-in-1;With upper extremity assist;With armrests Stand to Sit Details: VCs safety, hand placement. Uncontrolled descent Ambulation/Gait Ambulation/Gait: Yes Ambulation/Gait Assistance: 4: Min assist Ambulation/Gait Assistance Details (indicate cue type and reason): VCs safety/safe use of RW. Assist to stabilize pt intermittently. Ambulation improved with use of RW but pt still reluctant to use.  Ambulation Distance (Feet): 400 Feet Assistive device: Rolling walker Gait Pattern: Step-through pattern  Posture/Postural Control Posture/Postural Control: No significant limitations Balance Balance Assessed: Yes Static Standing Balance Static Standing - Comment/# of Minutes: Had pt stand statically for 1 minute with normal  BOS-required min-guard assist. Performed external perturbations for nearly 30 seconds-min assist to prevent fall 1-2x. Static standing with narrow BOS-pt unable to achieve stance without assist, unable to maintain longer than 5 seconds Dynamic Standing Balance Dynamic Standing - Comments: Had pt perform 360 turn - min-guard assist for turning to right. LOB with turning to left-mod assist to prevent fall Exercise    End of Session PT - End of Session Equipment Utilized During Treatment: Gait belt Activity Tolerance: Patient tolerated treatment well Patient left: in chair;with call bell in reach;with family/visitor present General Behavior During Session: Emily Wyatt for tasks performed Cognition: Impaired  Emily Wyatt Alert Emily Wyatt 07/22/2011, 4:04 PM [phone]

## 2011-07-22 NOTE — Progress Notes (Signed)
Occupational Therapy Evaluation Patient Details Name: Emily Wyatt MRN: [id number] DOB: 06/15/1927 Today's Date: 07/22/2011 EV2 [id number] Problem List:  Patient Active Problem List  Diagnoses  . Diverticulosis of colon  . Rectal prolapse  . Anemia due to blood loss  . Anticoagulant long-term use  . Atrial fibrillation  . LGI bleed  . Diverticulitis    Past Medical History:  Past Medical History  Diagnosis Date  . Diverticulitis   . Atrial fibrillation   . LGI bleed   . Anticoagulant long-term use   . Anemia due to blood loss   . Rectal prolapse   . Diverticulosis of colon    Past Surgical History: History reviewed. No pertinent past surgical history.  OT Assessment/Plan/Recommendation OT Assessment Clinical Impression Statement: Pt presents w/overall deconditioning, poor dynamic standing balance & poor safety awarenes. Skilled OT recommended to maximize ADL I to mod I for safe d/c home w/ HHOT & 24/7 A OT Recommendation/Assessment: Patient will need skilled OT in the acute care venue OT Problem List: Decreased activity tolerance;Impaired balance (sitting and/or standing);Decreased cognition;Decreased safety awareness;Decreased knowledge of use of DME or AE Barriers to Discharge: Inaccessible home environment;Decreased caregiver support OT Therapy Diagnosis : Generalized weakness OT Plan OT Frequency: Min 2X/week OT Treatment/Interventions: Self-care/ADL training;DME and/or AE instruction;Therapeutic activities;Patient/family education;Balance training OT Recommendation Follow Up Recommendations: Home health OT;24 hour supervision/assistance Equipment Recommended: Rolling walker with 5" wheels Individuals Consulted Consulted and Agree with Results and Recommendations: Other (comment) (Pt resistant to equip & HHOT) OT Goals Acute Rehab OT Goals OT Goal Formulation: With patient Time For Goal Achievement: 2 weeks ADL Goals Pt Will Perform Grooming: with modified  independence;Standing at sink (x 3 tasks) ADL Goal: Grooming - Progress: Progressing toward goals Pt Will Perform Upper Body Bathing: with modified independence;Sitting, chair;Sitting, edge of bed ADL Goal: Upper Body Bathing - Progress: Progressing toward goals Pt Will Perform Lower Body Bathing: with modified independence;Sit to stand from chair;Sit to stand from bed ADL Goal: Lower Body Bathing - Progress: Progressing toward goals Pt Will Perform Upper Body Dressing: with modified independence;Sitting, chair;Sitting, bed ADL Goal: Upper Body Dressing - Progress: Progressing toward goals Pt Will Perform Lower Body Dressing: with modified independence;Sit to stand from bed;Sit to stand from chair ADL Goal: Lower Body Dressing - Progress: Progressing toward goals Pt Will Transfer to Toilet: with modified independence;Ambulation;Grab bars;Regular height toilet ADL Goal: Toilet Transfer - Progress: Progressing toward goals Pt Will Perform Toileting - Clothing Manipulation: with modified independence;Other (comment) (sit<>stand from chair or EOB) ADL Goal: Toileting - Clothing Manipulation - Progress: Progressing toward goals Pt Will Perform Toileting - Hygiene: with modified independence;Sit to stand from 3-in-1/toilet ADL Goal: Toileting - Hygiene - Progress: Progressing toward goals Pt Will Perform Tub/Shower Transfer: with supervision;Grab bars ADL Goal: Tub/Shower Transfer - Progress: Not met  OT Evaluation Precautions/Restrictions  Precautions Precautions: Fall Restrictions Weight Bearing Restrictions: No Prior Functioning Home Living Home Adaptive Equipment: Straight cane;Grab bars in shower;Other (comment) (Pt states she has chair NEXT to tub) Prior Function Driving: No Vocation: Retired ADL ADL Grooming: Performed;Wash/dry face;Set up Where Assessed - Grooming: Standing at sink Upper Body Bathing: Performed;Chest;Right arm;Left arm;Abdomen;Set up Where Assessed - Upper Body  Bathing: Standing at sink Lower Body Bathing: Performed;Set up Lower Body Bathing Details (indicate cue type and reason): VCs needed for pt to sit to bathe legs & feet. Where Assessed - Lower Body Bathing: Sit to stand from chair Upper Body Dressing: Performed;Set up Where Assessed - Upper Body Dressing: Standing  Lower Body Dressing: Performed;Set up Where Assessed - Lower Body Dressing: Sit to stand from chair Toilet Transfer: Minimal assistance Toilet Transfer Method: Ambulating Toileting - Clothing Manipulation: Performed;Set up Where Assessed - Toileting Clothing Manipulation: Sit to stand from 3-in-1 or toilet Where Assessed - Toileting Hygiene: Sit to stand from 3-in-1 or toilet Tub/Shower Transfer: Not assessed Tub/Shower Transfer Method: Not assessed ADL Comments: Pt w/poor insight into limitations. Had several LOBs while bathing in bathroom, which she was unable to self correct. Poor safety awareness as well. Pt attempts to stand on one foot when completing LB ADLs Vision/Perception  Vision - History Baseline Vision: Wears glasses only for reading Patient Visual Report: No change from baseline Vision - Assessment Vision Assessment: Vision not tested Cognition Cognition Arousal/Alertness: Awake/alert Overall Cognitive Status: Impaired Memory: Appears impaired Memory Deficits: Pt with significant STM impairments, poor safety awareness and no insight into limitations Orientation Level: Oriented to person;Oriented to place;Oriented to situation;Disoriented to time Safety/Judgement: Decreased safety judgement for tasks assessed Decreased Safety/Judgement: Impulsive;Decreased awareness of need for assistance Safety/Judgement - Other Comments: Pt states that if she had a RW she probably would not use it & refuse HH therapy. Sensation/Coordination   Extremity Assessment RUE Assessment RUE Assessment: Within Functional Limits LUE Assessment LUE Assessment: Within Functional  Limits Mobility  Bed Mobility Bed Mobility: Yes Supine to Sit: 5: Supervision Sitting - Scoot to Edge of Bed: 5: Supervision Transfers Transfers: Yes Sit to Stand: 4: Min assist;From bed;From chair/3-in-1;From toilet Sit to Stand Details (indicate cue type and reason): Pt had several LOBs during transfers & dynamic standing activities. Noted posterior lean upon standing. Stand to Sit: 4: Min assist;With upper extremity assist;To chair/3-in-1;To toilet Stand to Sit Details: VCs for hand placement & to control descent to chair. Exercises   End of Session OT - End of Session Activity Tolerance: Patient tolerated treatment well Patient left: in chair;with call bell in reach General Behavior During Session: [facility] for tasks performed Cognition: Impaired (See comments in cognition section.)   Emily Wyatt A [phone] 07/22/2011, 9:58 AM

## 2011-07-22 NOTE — Progress Notes (Signed)
Subjective: "I am feeling better just not very hungry" Denies pain/nausea/discomfort. Reports at least 2 watery stools last night without visible blood.  Objective: Vital signs Filed Vitals:   07/21/11 2121 07/21/11 2127 07/22/11 0502 07/22/11 1302  BP: 151/64 142/69 148/64 104/58  Pulse: 66  76 72  Temp: 98.5 F (36.9 C)  97.9 F (36.6 C) 98.7 F (37.1 C)  TempSrc: Oral  Oral Oral  Resp: 16  16 16   Height:      Weight:      SpO2: 94%  94% 97%   Weight change:  Last BM Date: 07/21/11  Intake/Output from previous day: 12/18 0701 - 12/19 0700 In: 1020 [P.O.:920; IV Piggyback:100] Out: 1 [Stool:1] Total I/O In: 120 [P.O.:120] Out: -    Physical Exam: General: Alert, awake, oriented x3, in no acute distress. Up in chair, smiling HEENT: No bruits, no goiter. Mucus membranes mouth slightly pale/moist Heart: Regular rate and rhythm, without murmurs, rubs, gallops. Lungs:Normal effort. Breath sounds clear to auscultation bilaterally. No wheeze, rhonchi Abdomen: Soft, nontender, nondistended, positive bowel sounds. Extremities: No clubbing or edema with positive pedal pulses. Has area of ecchymosis over R calf Neuro: Grossly intact, nonfocal.    Lab Results: Basic Metabolic Panel:  Basename 07/22/11 0510 07/21/11 0445  NA 139 134*  K 3.9 3.1*  CL 105 102  CO2 27 25  GLUCOSE 99 114*  BUN 6 6  CREATININE 0.53 0.48*  CALCIUM 9.3 8.6  MG -- 1.8  PHOS -- --   Liver Function Tests: No results found for this basename: AST:2,ALT:2,ALKPHOS:2,BILITOT:2,PROT:2,ALBUMIN:2 in the last 72 hours No results found for this basename: LIPASE:2,AMYLASE:2 in the last 72 hours No results found for this basename: AMMONIA:2 in the last 72 hours CBC:  Basename 07/22/11 0510 07/21/11 0445  WBC 7.0 5.3  NEUTROABS -- --  HGB 10.2* 9.9*  HCT 30.1* 28.5*  MCV 92.9 91.9  PLT 285 242   Cardiac Enzymes: No results found for this basename: CKTOTAL:3,CKMB:3,CKMBINDEX:3,TROPONINI:3 in the  last 72 hours BNP: No components found with this basename: POCBNP:3 D-Dimer: No results found for this basename: DDIMER:2 in the last 72 hours CBG: No results found for this basename: GLUCAP:6 in the last 72 hours Hemoglobin A1C: No results found for this basename: HGBA1C in the last 72 hours Fasting Lipid Panel: No results found for this basename: CHOL,HDL,LDLCALC,TRIG,CHOLHDL,LDLDIRECT in the last 72 hours Thyroid Function Tests: No results found for this basename: TSH,T4TOTAL,FREET4,T3FREE,THYROIDAB in the last 72 hours Anemia Panel: No results found for this basename: VITAMINB12,FOLATE,FERRITIN,TIBC,IRON,RETICCTPCT in the last 72 hours Coagulation:  Basename 07/22/11 0510 07/21/11 0445  LABPROT 15.1 14.9  INR 1.17 1.15   Urine Drug Screen: Drugs of Abuse  No results found for this basename: labopia, cocainscrnur, labbenz, amphetmu, thcu, labbarb    Alcohol Level: No results found for this basename: ETH:2 in the last 72 hours Urinalysis:  Misc. Labs:  No results found for this or any previous visit (from the past 240 hour(s)).  Studies/Results: Dg Abd 1 View  07/20/2011  *RADIOLOGY REPORT*  Clinical Data: Abdominal pain and distension, evaluate for small bowel obstruction  ABDOMEN - 1 VIEW  Comparison: 07/19/2011; 07/18/2011; abdominal CT - 07/18/2011  Findings:  There is grossly unchanged moderate gaseous distension of multiple loops of large and small bowel.  The previously ingested enteric contrast remains within the hepatic flexure and descending colon. Evaluation for pneumoperitoneum is limited secondary to exclusion of the lower thorax.  No definite pneumatosis or portal venous gas. Calcified  phleboliths within the pelvis.  Limited visualization of the lower thorax demonstrates mild elevation of the right hemidiaphragm with bibasilar opacities, possibly atelectasis. Lumbar spine degenerative change.  IMPRESSION:  Grossly unchanged moderate gas distension of multiple loops  of large and small bowel with retained enteric contrast within the colon.  Findings are most suggestive of an ileus.  Original Report Authenticated By: Waynard Reeds, M.D.    Medications: Scheduled Meds:   . ciprofloxacin  500 mg Oral BID  . clonazePAM  0.5 mg Oral BID  . escitalopram  5 mg Oral Daily  . metroNIDAZOLE  500 mg Oral Q8H  . pantoprazole  40 mg Oral Q1200  . potassium chloride  40 mEq Oral Q4H   Continuous Infusions:  PRN Meds:.albuterol, diphenhydrAMINE, guaiFENesin-dextromethorphan, HYDROcodone-acetaminophen, loperamide, metoprolol, ondansetron (ZOFRAN) IV, ondansetron  Assessment/Plan:  Principal Problem:  1. Diverticulitis vs Ischemic Colitis and lower GI Bleed in the setting of Chronic anti coagulation for a fib..: H&H stable between at 9-10.No further black tarry stools. Several watery brown stools last evening. No blood noted.  Cipro and flagyl day #4. Has been avoiding eating to avoid diarrhea/BM.  Discussed need to eat to verify resolution of blding. Verbalized understanding. If no further tarry stools or bloody stools with tolerating diet, may be discharged to home in am.   2.Atrial fibrillation: rate controlled and in sinus currently. Iv prn metoprolol. Has not needed prn med. HR range 63-72. Would keep the patient off coumadin due to GI bleeding, and history of falls.  She may follow up with her primary care doctor Dr. Selena Batten to revisit in the future as felt appropriate.  Can likely d/c home on baby aspirin 3.Anemia : secondary to blood loss. Stable.  4.UTI: already on cipro day #4.   5.Hypokalemia: Resolved. Mag level 1.8 6. Dispo: likely to home with home health PT/OT in am     LOS: 5 days   [facility] M 07/22/2011, 1:17 PM  Patient was independently seen and examined, agree with the assessment and plan as per Toya Smothers, NP

## 2011-07-22 NOTE — Progress Notes (Signed)
Spoke with patient at bedside. States she lives alone but her dtr lives 2 doors down from her. Discussed recommendation regarding d/c plan, home with [facility] and 24h supervision. Per note family will be able to provide 24h supervision. Provided patient with [facility] list for choice, will f/u later. Patient states she will discuss with family and make a decision. Per RN patient somewhat weaker today.

## 2011-07-23 LAB — PROTIME-INR: INR: 1.18 (ref 0.00–1.49)

## 2011-07-23 LAB — CBC
HCT: 30 % — ABNORMAL LOW (ref 36.0–46.0)
Hemoglobin: 10.2 g/dL — ABNORMAL LOW (ref 12.0–15.0)
MCV: 92.9 fL (ref 78.0–100.0)
WBC: 5.3 10*3/uL (ref 4.0–10.5)

## 2011-07-23 MED ORDER — PANTOPRAZOLE SODIUM 40 MG PO TBEC: 40.0000 mg | DELAYED_RELEASE_TABLET | Freq: Every day | ORAL | Status: DC

## 2011-07-23 MED ORDER — GUAIFENESIN-DM 100-10 MG/5ML PO SYRP: 5.0000 mL | ORAL_SOLUTION | Freq: Three times a day (TID) | ORAL | Status: AC | PRN

## 2011-07-23 MED ORDER — METRONIDAZOLE 500 MG PO TABS: 500.0000 mg | ORAL_TABLET | Freq: Three times a day (TID) | ORAL | Status: AC

## 2011-07-23 MED ORDER — CIPROFLOXACIN HCL 500 MG PO TABS: 500.0000 mg | ORAL_TABLET | Freq: Two times a day (BID) | ORAL | Status: AC

## 2011-07-23 NOTE — Progress Notes (Signed)
07-23-11 Follow up visit for [facility] services. Spoke with patient at bedside who is adamant about not going to SNF. Plans to return home. Her dtr, Lilyan Punt lives 1 door down from patient. PCP: Dr. Pearson Grippe. Spoke with dtr via cell phone; Made dtr aware of the recommendations for 24/7 [facility] services for PT/OT and rw. [facility]. Will fax information to [facility] at [phone]. [facility] called for rw. Rob with [facility] delivered rw to patient's room. No further needs assessed.  Raiford Noble, RN, BSN, [place] [phone]

## 2011-07-23 NOTE — Discharge Summary (Signed)
Physician Discharge Summary  Patient ID: Emily Wyatt MRN: [id number] DOB/AGE: 75-Jul-1928 75 y.o.  Admit date: 07/17/2011 Discharge date: 07/23/2011  Primary Care Physician:  Pearson Grippe, MD, MD   Discharge Diagnoses:    Present on Admission:  .Diverticulosis of colon .Rectal prolapse .Diverticulitis .Atrial fibrillation .Anemia due to blood loss .Anticoagulant long-term use .LGI bleed  Discharge Medication List as of 07/23/2011 12:56 PM    START taking these medications   Details  ciprofloxacin (CIPRO) 500 MG tablet Take 1 tablet (500 mg total) by mouth 2 (two) times daily., Starting 07/23/2011, Until Tue 07/28/11, Print    guaiFENesin-dextromethorphan (ROBITUSSIN DM) 100-10 MG/5ML syrup Take 5 mLs by mouth 3 (three) times daily as needed for cough., Starting 07/23/2011, Until Sun 08/02/11, Print    metroNIDAZOLE (FLAGYL) 500 MG tablet Take 1 tablet (500 mg total) by mouth every 8 (eight) hours., Starting 07/23/2011, Until Tue 07/28/11, Print    pantoprazole (PROTONIX) 40 MG tablet Take 1 tablet (40 mg total) by mouth daily at 12 noon., Starting 07/23/2011, Until Fri 07/22/12, Print      CONTINUE these medications which have NOT CHANGED   Details  clonazePAM (KLONOPIN) 0.5 MG tablet Take 0.5 mg by mouth daily as needed. anxiety , Until Discontinued, Historical Med    escitalopram (LEXAPRO) 5 MG tablet Take 5 mg by mouth daily.  , Until Discontinued, Historical Med      STOP taking these medications     warfarin (COUMADIN) 2.5 MG tablet          Disposition and Follow-up: Pt. Medically stable and ready for discharge to home. Follow up with PCP 10 days. Follow up GI 3 weeks  Consults:  GI   Physical exam: General: Alert, awake, oriented x3, in no acute distress. Up in chair, smiling  HEENT: No bruits, no goiter. Mucus membranes mouth slightly pale/moist  Heart: Regular rate and rhythm, without murmurs, rubs, gallops.  Lungs:Normal effort. Breath sounds clear  to auscultation bilaterally. No wheeze, rhonchi  Abdomen: Soft, nontender, nondistended, positive bowel sounds.  Extremities: No clubbing or edema with positive pedal pulses. Has area of ecchymosis over R calf  Neuro: Grossly intact, nonfocal.   Significant Diagnostic Studies:  Ct Abdomen Pelvis W Contrast  07/18/2011  *RADIOLOGY REPORT*  Clinical Data: Lower GI bleed  CT ABDOMEN AND PELVIS WITH CONTRAST  Technique:  Multidetector CT imaging of the abdomen and pelvis was performed following the standard protocol during bolus administration of intravenous contrast.  Contrast: OMNIPAQUE IOHEXOL 300 MG/ML IV SOLN  Comparison: 07/18/2011 radiograph, 11/10/2009 CT  Findings: Bibasilar scarring.  Coronary artery calcification.  Marked intra and extrahepatic biliary ductal dilatation to the level of the ampulla, similar to prior.  There is main pancreatic ductal dilatation, measuring up to 6 mm.  This is also similar to prior.  Status post cholecystectomy.  No focal liver lesion. Unremarkable spleen and adrenal glands.  Bilateral renal hypodensities are too small to further characterize and a exophytic cyst off the lower pole right kidney.  And colonic diverticulosis. There is acute diverticulitis involving the the distal ileum. There are mildly prominent loops of small bowel proximal to this point, with air-fluid levels.  No free intraperitoneal air or fluid.  There is scattered atherosclerotic calcification of the aorta and its branches. No aneurysmal dilatation.  Post hysterectomy.  Thin-walled bladder.  Pelvic floor laxity. Filling defect within a prominent anterior vessel contributing to the right common femoral vein.  No aggressive osseous lesion.  Stable cystic  changes of the sacrum. Compression fracture of T12 and multilevel degenerative disc disease.  IMPRESSION: Inflammatory changes involving a focal segment of ileum is favored to represent acute ileal diverticulitis.  There are mildly distended  proximal loops of small bowel which may be secondary to delayed transit.  Recommend follow-up radiograph to document contrast passage into the colon. An alternative consideration based on the imaging would be an early small bowel obstruction pattern with inflammatory or early ischemic changes involving this segment. Therefore, correlation with clinical symptoms and presentation recommend.  Discussed via telephone with Colleen Can at 03:15 a.m. on 07/18/2011.  Original Report Authenticated By: Waneta Martins, M.D.   Dg Abd Portable 1v  07/18/2011  *RADIOLOGY REPORT*  Clinical Data: Rectal bleeding  ABDOMEN - 1 VIEW  Comparison: 11/10/2009 CT  Findings: Nonobstructive bowel gas pattern.  Hemidiaphragms excluded from the image.  Osteopenia.  Multiple pelvic phlebolith. Vascular calcifications.  Surgical clips right upper quadrant. Cannot evaluate for free intraperitoneal air on this portable supine radiograph.  IMPRESSION: Nonobstructive bowel gas pattern.  Original Report Authenticated By: Waneta Martins, M.D.    Labs Reviewed  CBC - Abnormal; Notable for the following:    WBC 13.6 (*)    RBC 3.05 (*)    Hemoglobin 9.9 (*)    HCT 28.7 (*)    All other components within normal limits  DIFFERENTIAL - Abnormal; Notable for the following:    Neutrophils Relative 86 (*)    Lymphocytes Relative 6 (*)    Neutro Abs 11.7 (*)    Monocytes Absolute 1.1 (*)    All other components within normal limits  COMPREHENSIVE METABOLIC PANEL - Abnormal; Notable for the following:    Sodium 129 (*)    Glucose, Bld 159 (*)    Albumin 3.3 (*)    GFR calc non Af Amer 77 (*)    GFR calc Af Amer 89 (*)    All other components within normal limits  URINALYSIS, ROUTINE W REFLEX MICROSCOPIC - Abnormal; Notable for the following:    APPearance CLOUDY (*)    Hgb urine dipstick LARGE (*)    Leukocytes, UA MODERATE (*)    All other components within normal limits  PROTIME-INR - Abnormal; Notable for the  following:    Prothrombin Time 32.1 (*)    INR 3.06 (*)    All other components within normal limits  HEMOGLOBIN AND HEMATOCRIT, BLOOD - Abnormal; Notable for the following:    Hemoglobin 9.4 (*)    HCT 27.1 (*)    All other components within normal limits  OSMOLALITY, URINE - Abnormal; Notable for the following:    Osmolality, Ur 302 (*)    All other components within normal limits  BASIC METABOLIC PANEL - Abnormal; Notable for the following:    Sodium 132 (*)    Potassium 3.3 (*) DELTA CHECK NOTED   GFR calc non Af Amer 84 (*)    All other components within normal limits  CBC - Abnormal; Notable for the following:    RBC 2.79 (*)    Hemoglobin 9.0 (*)    HCT 26.2 (*)    All other components within normal limits  URINE MICROSCOPIC-ADD ON - Abnormal; Notable for the following:    Squamous Epithelial / LPF FEW (*)    Bacteria, UA MANY (*)    All other components within normal limits  HEMOGLOBIN AND HEMATOCRIT, BLOOD - Abnormal; Notable for the following:    Hemoglobin 8.3 (*)  HCT 23.9 (*)    All other components within normal limits  APTT - Abnormal; Notable for the following:    aPTT 45 (*)    All other components within normal limits  PROTIME-INR - Abnormal; Notable for the following:    Prothrombin Time 15.8 (*)    All other components within normal limits  BASIC METABOLIC PANEL - Abnormal; Notable for the following:    Glucose, Bld 108 (*)    GFR calc non Af Amer 85 (*)    All other components within normal limits  HEMOGLOBIN AND HEMATOCRIT, BLOOD - Abnormal; Notable for the following:    Hemoglobin 9.3 (*)    HCT 26.8 (*)    All other components within normal limits  CBC - Abnormal; Notable for the following:    RBC 2.94 (*)    Hemoglobin 9.4 (*)    HCT 27.2 (*)    All other components within normal limits  CBC - Abnormal; Notable for the following:    RBC 3.10 (*)    Hemoglobin 9.9 (*)    HCT 28.5 (*)    All other components within normal limits  BASIC  METABOLIC PANEL - Abnormal; Notable for the following:    Sodium 134 (*)    Potassium 3.1 (*)    Glucose, Bld 114 (*)    Creatinine, Ser 0.48 (*)    GFR calc non Af Amer 89 (*)    All other components within normal limits  CBC - Abnormal; Notable for the following:    RBC 3.24 (*)    Hemoglobin 10.2 (*)    HCT 30.1 (*)    All other components within normal limits  BASIC METABOLIC PANEL - Abnormal; Notable for the following:    GFR calc non Af Amer 86 (*)    All other components within normal limits  CBC - Abnormal; Notable for the following:    RBC 3.23 (*)    Hemoglobin 10.2 (*)    HCT 30.0 (*)    All other components within normal limits  TYPE AND SCREEN  PREPARE FRESH FROZEN PLASMA  OSMOLALITY  SODIUM, URINE, RANDOM  LACTIC ACID, PLASMA  PROTIME-INR  PREPARE RBC (CROSSMATCH)  PROTIME-INR  OCCULT BLOOD, POC DEVICE  PROTIME-INR  MAGNESIUM  PROTIME-INR  PROTIME-INR        Dg Abd 1 View  07/20/2011  *RADIOLOGY REPORT*  Clinical Data: Abdominal pain and distension, evaluate for small bowel obstruction  ABDOMEN - 1 VIEW  Comparison: 07/19/2011; 07/18/2011; abdominal CT - 07/18/2011  Findings:  There is grossly unchanged moderate gaseous distension of multiple loops of large and small bowel.  The previously ingested enteric contrast remains within the hepatic flexure and descending colon. Evaluation for pneumoperitoneum is limited secondary to exclusion of the lower thorax.  No definite pneumatosis or portal venous gas. Calcified phleboliths within the pelvis.  Limited visualization of the lower thorax demonstrates mild elevation of the right hemidiaphragm with bibasilar opacities, possibly atelectasis. Lumbar spine degenerative change.  IMPRESSION:  Grossly unchanged moderate gas distension of multiple loops of large and small bowel with retained enteric contrast within the colon.  Findings are most suggestive of an ileus.  Original Report Authenticated By: Waynard Reeds, M.D.     Ct Abdomen Pelvis W Contrast  07/18/2011  *RADIOLOGY REPORT*  Clinical Data: Lower GI bleed  CT ABDOMEN AND PELVIS WITH CONTRAST  Technique:  Multidetector CT imaging of the abdomen and pelvis was performed following the standard protocol during bolus  administration of intravenous contrast.  Contrast: OMNIPAQUE IOHEXOL 300 MG/ML IV SOLN  Comparison: 07/18/2011 radiograph, 11/10/2009 CT  Findings: Bibasilar scarring.  Coronary artery calcification.  Marked intra and extrahepatic biliary ductal dilatation to the level of the ampulla, similar to prior.  There is main pancreatic ductal dilatation, measuring up to 6 mm.  This is also similar to prior.  Status post cholecystectomy.  No focal liver lesion. Unremarkable spleen and adrenal glands.  Bilateral renal hypodensities are too small to further characterize and a exophytic cyst off the lower pole right kidney.  And colonic diverticulosis. There is acute diverticulitis involving the the distal ileum. There are mildly prominent loops of small bowel proximal to this point, with air-fluid levels.  No free intraperitoneal air or fluid.  There is scattered atherosclerotic calcification of the aorta and its branches. No aneurysmal dilatation.  Post hysterectomy.  Thin-walled bladder.  Pelvic floor laxity. Filling defect within a prominent anterior vessel contributing to the right common femoral vein.  No aggressive osseous lesion.  Stable cystic changes of the sacrum. Compression fracture of T12 and multilevel degenerative disc disease.  IMPRESSION: Inflammatory changes involving a focal segment of ileum is favored to represent acute ileal diverticulitis.  There are mildly distended proximal loops of small bowel which may be secondary to delayed transit.  Recommend follow-up radiograph to document contrast passage into the colon. An alternative consideration based on the imaging would be an early small bowel obstruction pattern with inflammatory or early  ischemic changes involving this segment. Therefore, correlation with clinical symptoms and presentation recommend.  Discussed via telephone with Colleen Can at 03:15 a.m. on 07/18/2011.  Original Report Authenticated By: Waneta Martins, M.D.   Dg Abd 2 Views  07/19/2011  *RADIOLOGY REPORT*  Clinical Data: Follow up partial small bowel obstruction.  ABDOMEN - 2 VIEW  Comparison: 07/18/2011  Findings:  Again identified are multiple abnormally dilated loops of small bowel. The degree of small bowel distention is unchanged.  There has been antegrade progression of enteric contrast material from the small bowel into the colon.  On the upright images there are multiple colonic fluid levels.  IMPRESSION:  1.  Partial small bowel obstruction.  Similar to previous exam.  Original Report Authenticated By: Rosealee Albee, M.D.   Dg Abd Portable 1v  07/18/2011  *RADIOLOGY REPORT*  Clinical Data: Rectal bleeding  ABDOMEN - 1 VIEW  Comparison: 11/10/2009 CT  Findings: Nonobstructive bowel gas pattern.  Hemidiaphragms excluded from the image.  Osteopenia.  Multiple pelvic phlebolith. Vascular calcifications.  Surgical clips right upper quadrant. Cannot evaluate for free intraperitoneal air on this portable supine radiograph.  IMPRESSION: Nonobstructive bowel gas pattern.  Original Report Authenticated By: Waneta Martins, M.D.       Brief H and P: For complete details please refer to admission H and P, but in brief  Jarvis Knodel is a 77 y.o. female, with H/O Diverticulosis who is on Coumadin for A.Fib, comes in after having some LLQ Abd pain-dull-nonradiaitng with no +/- factors for 1-2 days, today around 7pm went to have a BM but had large Red Blood come out instead, felt weak and was brought to the ER, I was called to admit the patient. Currently relatively symptom free except mild LLQ pain, not dizzy, no SOB, no     Hospital Course:  Active Hospital Problems  Diagnoses Date Noted   .  Diverticulosis of colon    . Rectal prolapse    . Atrial fibrillation    .  Anticoagulant long-term use      Resolved Hospital Problems  Diagnoses Date Noted Date Resolved  . LGI bleed  07/22/2011  . Diverticulitis  07/22/2011  . Anemia due to blood loss  07/22/2011    Active Problems: 1. Diverticulitis vs Ischemic Colitis and lower GI Bleed in the setting of Chronic anti coagulation for a fib..: Pt admited to tele. CT as above. Serial H& H, clear liquids. Flagyl and Cipro started. Seen by GI who favored diverticulitis over ischemia.   H&H remained  stable between at 9-10.No further black tarry stools.  Cipro and flagyl day #5. Tolerating regular diet with no further BRBPR or black tarry stools. Will continue antibiotics 5 more days per GI. 2.Atrial fibrillation: rate controlled and in sinus currently. 63-72.  Would keep the patient off coumadin due to GI bleeding, and history of falls. She may follow up with her primary care doctor Dr. Selena Batten to revisit in the future as felt appropriate. Will d/c home on baby aspirin  3.Anemia : secondary to blood loss. Stable.  4.UTI: already on cipro day #5.  5.Hypokalemia: Resolved. Mag level 1.8    Time spent on Discharge: 35 min  Signed: Tangi Shroff 07/23/2011, 7:45 PM  Patient was independently seen and examined.  Agree with assessment and plan as per Toya Smothers, NP

## 2011-07-30 ENCOUNTER — Encounter: Payer: Self-pay | Admitting: Gastroenterology

## 2011-08-18 ENCOUNTER — Encounter: Payer: Self-pay | Admitting: Gastroenterology

## 2011-08-18 ENCOUNTER — Other Ambulatory Visit (INDEPENDENT_AMBULATORY_CARE_PROVIDER_SITE_OTHER): Payer: Medicare Other

## 2011-08-18 ENCOUNTER — Ambulatory Visit (INDEPENDENT_AMBULATORY_CARE_PROVIDER_SITE_OTHER): Payer: Medicare Other | Admitting: Gastroenterology

## 2011-08-18 VITALS — BP 110/60 | HR 60 | Ht 67.5 in | Wt 120.2 lb

## 2011-08-18 DIAGNOSIS — K5790 Diverticulosis of intestine, part unspecified, without perforation or abscess without bleeding: Secondary | ICD-10-CM

## 2011-08-18 DIAGNOSIS — R932 Abnormal findings on diagnostic imaging of liver and biliary tract: Secondary | ICD-10-CM

## 2011-08-18 DIAGNOSIS — K5730 Diverticulosis of large intestine without perforation or abscess without bleeding: Secondary | ICD-10-CM

## 2011-08-18 LAB — CBC WITH DIFFERENTIAL/PLATELET
Basophils Relative: 0.6 % (ref 0.0–3.0)
Eosinophils Absolute: 0.2 10*3/uL (ref 0.0–0.7)
Eosinophils Relative: 3.1 % (ref 0.0–5.0)
Hemoglobin: 11.4 g/dL — ABNORMAL LOW (ref 12.0–15.0)
Lymphocytes Relative: 32.5 % (ref 12.0–46.0)
Monocytes Relative: 9 % (ref 3.0–12.0)
Neutrophils Relative %: 54.8 % (ref 43.0–77.0)
RBC: 3.45 Mil/uL — ABNORMAL LOW (ref 3.87–5.11)
WBC: 5.4 10*3/uL (ref 4.5–10.5)

## 2011-08-18 LAB — COMPREHENSIVE METABOLIC PANEL
Albumin: 4 g/dL (ref 3.5–5.2)
BUN: 10 mg/dL (ref 6–23)
Calcium: 9 mg/dL (ref 8.4–10.5)
Chloride: 102 mEq/L (ref 96–112)
Creatinine, Ser: 0.6 mg/dL (ref 0.4–1.2)
GFR: 96.08 mL/min (ref >60.00)
Glucose, Bld: 99 mg/dL (ref 70–99)
Potassium: 3.9 mEq/L (ref 3.5–5.1)

## 2011-08-18 NOTE — Progress Notes (Signed)
HPI: This is a very pleasant 78 year old woman whom I last saw about 3 or 4 weeks ago when she was hospitalized at [facility].  She had presumed right colon, ileal diverticulitis. Indeed she has had a very complicated diverticulitis history with 2 segmental colon resections over the past several years done at [facility]. She was previously in the care of Dr. Sabino Gasser, and I see a colonoscopy report from April 2010 showing scattered diverticulosis throughout her remaining colon.  Since leaving the hosp 3-4 weeks ago she has felt fine.  DAughter here tells me she is not eating well.  She is not hungry.  She has lost weight.    She was having a lot of diarrhea in hospital, then 8-9 BMs a day at home.  She was told to be on BRAT diet and start imodium.  Currently off imodium because of some constipation.  Actually needing miralax now.   She is off coumadin for the foreseable future and instead on asa 81mg  once daily.  Recent CT scan also showed a very dilated extrahepatic bile duct, mild pancreatic duct dilation, intrahepatic duct dilation. Her liver tests were all normal. The CT scan report states that this is unchanged from CT scan 3 years ago.  She and her daughter have never heard that she had biliary duct dilation in the past but she did have her gallbladder removed 4 or 5 years ago for significant gallstone disease.  IMPRESSION CT scan 12/12:  Inflammatory changes involving a focal segment of ileum is favored  to represent acute ileal diverticulitis. There are mildly  distended proximal loops of small bowel which may be secondary to  delayed transit. Recommend follow-up radiograph to document  contrast passage into the colon. An alternative consideration based  on the imaging would be an early small bowel obstruction pattern  with inflammatory or early ischemic changes involving this segment.  Therefore, correlation with clinical symptoms and presentation  recommend.    Past  Medical History  Diagnosis Date  . Diverticulitis   . Atrial fibrillation   . LGI bleed   . Anticoagulant long-term use   . Anemia due to blood loss   . Rectal prolapse   . Diverticulosis of colon   . Chronic headaches   . Gallstones   . IBS (irritable bowel syndrome)   . Bowel obstruction   . UTI (lower urinary tract infection)   . Depression   . Anxiety     Past Surgical History  Procedure Date  . Appendectomy   . Cholescystectomy   . Abdominal hysterectomy   . Small intestine surgery     Current Outpatient Prescriptions  Medication Sig Dispense Refill  . aspirin 81 MG tablet Take 160 mg by mouth daily.      . cholecalciferol (VITAMIN D) 1000 UNITS tablet Take 1,000 Units by mouth daily.      . clonazePAM (KLONOPIN) 0.5 MG tablet Take 0.5 mg by mouth daily as needed. anxiety       . escitalopram (LEXAPRO) 5 MG tablet Take 5 mg by mouth daily.        . pantoprazole (PROTONIX) 40 MG tablet Take 1 tablet (40 mg total) by mouth daily at 12 noon.  30 tablet  0  . vitamin B-12 (CYANOCOBALAMIN) 500 MCG tablet Take 500 mcg by mouth daily.        Allergies as of 08/18/2011 - Review Complete 08/18/2011  Allergen Reaction Noted  . Codeine  07/17/2011  . Morphine  and related  08/18/2011  . Phenergan  08/18/2011    Family History  Problem Relation Age of Onset  . Colon cancer Sister   . Heart disease Sister     History   Social History  . Marital Status: Widowed    Spouse Name: N/A    Number of Children: 4  . Years of Education: N/A   Occupational History  . retired    Social History Main Topics  . Smoking status: Former Smoker    Quit date: 08/04/1983  . Smokeless tobacco: Never Used  . Alcohol Use: No  . Drug Use: No  . Sexually Active: Not Currently -- Female partner(s)   Other Topics Concern  . Not on file   Social History Narrative  . No narrative on file      Physical Exam: BP 110/60  Pulse 60  Ht 5' 7.5" (1.715 m)  Wt 120 lb 3.2 oz (54.522  kg)  BMI 18.55 kg/m2 Constitutional: generally well-appearing Psychiatric: alert and oriented x3 Abdomen: soft, nontender, nondistended, no obvious ascites, no peritoneal signs, normal bowel sounds     Assessment and plan: 78 y.o. female with recent possible ileal, right colon diverticulitis, chronic biliary and pancreatic duct dilation  It is very possible that she had right colon diverticulitis especially given her complicated diverticulitis sister are segmental resection. Her bowels are not yet normal and I have recommended she be on fiber supplements for that. She and her daughter had not heard that she had dilated pancreatic duct, dilated bile duct with dates back at least to 2010. I do not think she has clear biliary symptoms and for today I am simply going to repeat a set of blood work including complete metabolic profile and CBC. She will return to see me in 2 months and sooner if needed.

## 2011-08-18 NOTE — Patient Instructions (Addendum)
You will have labs checked today in the basement lab.  Please head down after you check out with the front desk  (cbc, cmet).  No restrictions on your diet. Please start taking citrucel (orange flavored) powder fiber supplement.  This may cause some bloating at first but that usually goes away. Begin with a small spoonful and work your way up to a large, heaping spoonful daily over a week. Return to see Dr. Christella Hartigan in 2 months.

## 2011-10-27 ENCOUNTER — Encounter: Payer: Self-pay | Admitting: Gastroenterology

## 2011-10-27 ENCOUNTER — Ambulatory Visit (INDEPENDENT_AMBULATORY_CARE_PROVIDER_SITE_OTHER): Payer: Medicare Other | Admitting: Gastroenterology

## 2011-10-27 VITALS — BP 122/86 | HR 78 | Ht 67.0 in | Wt 122.6 lb

## 2011-10-27 DIAGNOSIS — K5730 Diverticulosis of large intestine without perforation or abscess without bleeding: Secondary | ICD-10-CM

## 2011-10-27 NOTE — Patient Instructions (Signed)
Continue taking the metamucil daily. Return to see Dr. Christella Hartigan in 2-3 months, sooner if needed.

## 2011-10-27 NOTE — Progress Notes (Signed)
Review of pertinent gastrointestinal problems: 1. complicated diverticulitis history with 2 segmental colon resections over the past several years done at [facility]. She was previously in the care of Dr. Sabino Gasser, and I see a colonoscopy report from April 2010 showing scattered diverticulosis throughout her remaining colon.  January 2013, question of right-sided diverticulitis on CT scan. Treated as such with IV antibiotics. 2. dilated bile duct, pancreatic duct on imaging 2010 (patient was not aware of this as of January 2013), repeat imaging while hospitalized in 2013 showed unchanged dilations of these ducts.  Liver tests dating back to 2011 and also in January 2013 have all been normal.   HPI: This is a  very pleasant 78 year old woman whom I last saw about 2 months ago.  She does not eat a lot, but that is her usual.  She feels "fine".  She has one BM every day.  No nausea or vomiting.  No fever or chills.   She is back to her usual self from prior to the hospitalization 2 month ago.   She has "no complaints"  Overall she has lost a lot weight since surgery at [facility] 1-2 years ago.    Past Medical History  Diagnosis Date  . Diverticulitis   . Atrial fibrillation   . LGI bleed   . Anticoagulant long-term use   . Anemia due to blood loss   . Rectal prolapse   . Diverticulosis of colon   . Chronic headaches   . Gallstones   . IBS (irritable bowel syndrome)   . Bowel obstruction   . UTI (lower urinary tract infection)   . Depression   . Anxiety     Past Surgical History  Procedure Date  . Appendectomy   . Cholescystectomy   . Abdominal hysterectomy   . Small intestine surgery     Current Outpatient Prescriptions  Medication Sig Dispense Refill  . aspirin 81 MG tablet Take 160 mg by mouth daily.      . cholecalciferol (VITAMIN D) 1000 UNITS tablet Take 1,000 Units by mouth daily.      . clonazePAM (KLONOPIN) 0.5 MG tablet Take 0.5 mg by mouth daily as needed.  anxiety       . escitalopram (LEXAPRO) 5 MG tablet Take 5 mg by mouth daily.        . pantoprazole (PROTONIX) 40 MG tablet Take 1 tablet (40 mg total) by mouth daily at 12 noon.  30 tablet  0  . vitamin B-12 (CYANOCOBALAMIN) 500 MCG tablet Take 500 mcg by mouth daily.        Allergies as of 10/27/2011 - Review Complete 10/27/2011  Allergen Reaction Noted  . Codeine  07/17/2011  . Morphine and related  08/18/2011  . Phenergan  08/18/2011    Family History  Problem Relation Age of Onset  . Colon cancer Sister   . Heart disease Sister     History   Social History  . Marital Status: Widowed    Spouse Name: N/A    Number of Children: 4  . Years of Education: N/A   Occupational History  . retired    Social History Main Topics  . Smoking status: Former Smoker    Quit date: 08/04/1983  . Smokeless tobacco: Never Used  . Alcohol Use: No  . Drug Use: No  . Sexually Active: Not Currently -- Female partner(s)   Other Topics Concern  . Not on file   Social History Narrative  . No narrative  on file      Physical Exam: BP 122/86  Pulse 78  Ht 5\' 7"  (1.702 m)  Wt 122 lb 9.6 oz (55.611 kg)  BMI 19.20 kg/m2 Constitutional: generally well-appearing Psychiatric: alert and oriented x3 Abdomen: soft, nontender, nondistended, no obvious ascites, no peritoneal signs, normal bowel sounds     Assessment and plan: 78 y.o. female with recent hospitalization for "right-sided diverticulitis"  She really seems to be getting back to normal. I recommended she stay on Metamucil once daily as it helps her bowels move easily. She will return to see me in 2-3 months and sooner if needed.

## 2011-10-29 ENCOUNTER — Encounter: Payer: Self-pay | Admitting: Family Medicine

## 2011-10-29 ENCOUNTER — Ambulatory Visit (INDEPENDENT_AMBULATORY_CARE_PROVIDER_SITE_OTHER): Payer: Medicare Other | Admitting: Family Medicine

## 2011-10-29 DIAGNOSIS — H9209 Otalgia, unspecified ear: Secondary | ICD-10-CM

## 2011-10-29 DIAGNOSIS — H6120 Impacted cerumen, unspecified ear: Secondary | ICD-10-CM

## 2011-10-29 NOTE — Progress Notes (Signed)
  Subjective:    Patient ID: Emily Wyatt, female    DOB: 07-09-1927, 78 y.o.   MRN: [id number]  HPI 78 yo female with hearing loss and cerumen impaction.  Went for hearing aid consultation but told her ears were full of wax.  Here to have them cleaned out. No pain.     Review of Systems Negative except as per HPI     Objective:   Physical Exam  Constitutional: She appears well-developed.  Pulmonary/Chest: Effort normal.  Neurological: She is alert.   Bilateral ear canals approx 75% occluded by cerumen.  Flushed and no pathology after cleaned out.  Clear view of TM's       Assessment & Plan:  Hearing loss Cerumen impaction  Flushed.

## 2012-01-08 ENCOUNTER — Ambulatory Visit: Payer: Medicare Other | Admitting: Gastroenterology

## 2012-01-12 ENCOUNTER — Telehealth: Payer: Self-pay | Admitting: Gastroenterology

## 2012-01-12 NOTE — Telephone Encounter (Signed)
Message copied by Arna Snipe on Tue Jan 12, 2012  2:31 PM ------      Message from: Donata Duff      Created: Fri Jan 08, 2012  2:03 PM       Do not bill

## 2012-07-28 ENCOUNTER — Ambulatory Visit: Payer: Medicare Other | Admitting: Occupational Therapy

## 2012-11-26 ENCOUNTER — Emergency Department (HOSPITAL_COMMUNITY): Payer: Medicare Other

## 2012-11-26 ENCOUNTER — Encounter (HOSPITAL_COMMUNITY): Payer: Self-pay

## 2012-11-26 ENCOUNTER — Emergency Department (HOSPITAL_COMMUNITY)
Admission: EM | Admit: 2012-11-26 | Discharge: 2012-11-26 | Disposition: A | Discharge status: Home / Self Care | Payer: Medicare Other | Attending: Emergency Medicine | Admitting: Emergency Medicine

## 2012-11-26 DIAGNOSIS — Z87440 Personal history of urinary (tract) infections: Secondary | ICD-10-CM | POA: Insufficient documentation

## 2012-11-26 DIAGNOSIS — M25551 Pain in right hip: Secondary | ICD-10-CM

## 2012-11-26 DIAGNOSIS — F3289 Other specified depressive episodes: Secondary | ICD-10-CM | POA: Insufficient documentation

## 2012-11-26 DIAGNOSIS — W19XXXA Unspecified fall, initial encounter: Secondary | ICD-10-CM

## 2012-11-26 DIAGNOSIS — S79919A Unspecified injury of unspecified hip, initial encounter: Secondary | ICD-10-CM | POA: Insufficient documentation

## 2012-11-26 DIAGNOSIS — Z79899 Other long term (current) drug therapy: Secondary | ICD-10-CM | POA: Insufficient documentation

## 2012-11-26 DIAGNOSIS — Z8719 Personal history of other diseases of the digestive system: Secondary | ICD-10-CM | POA: Insufficient documentation

## 2012-11-26 DIAGNOSIS — Z862 Personal history of diseases of the blood and blood-forming organs and certain disorders involving the immune mechanism: Secondary | ICD-10-CM | POA: Insufficient documentation

## 2012-11-26 DIAGNOSIS — F411 Generalized anxiety disorder: Secondary | ICD-10-CM | POA: Insufficient documentation

## 2012-11-26 DIAGNOSIS — W010XXA Fall on same level from slipping, tripping and stumbling without subsequent striking against object, initial encounter: Secondary | ICD-10-CM | POA: Insufficient documentation

## 2012-11-26 DIAGNOSIS — Y92009 Unspecified place in unspecified non-institutional (private) residence as the place of occurrence of the external cause: Secondary | ICD-10-CM | POA: Insufficient documentation

## 2012-11-26 DIAGNOSIS — Z7982 Long term (current) use of aspirin: Secondary | ICD-10-CM | POA: Insufficient documentation

## 2012-11-26 DIAGNOSIS — Z8679 Personal history of other diseases of the circulatory system: Secondary | ICD-10-CM | POA: Insufficient documentation

## 2012-11-26 DIAGNOSIS — Y9389 Activity, other specified: Secondary | ICD-10-CM | POA: Insufficient documentation

## 2012-11-26 DIAGNOSIS — Z87891 Personal history of nicotine dependence: Secondary | ICD-10-CM | POA: Insufficient documentation

## 2012-11-26 DIAGNOSIS — F329 Major depressive disorder, single episode, unspecified: Secondary | ICD-10-CM | POA: Insufficient documentation

## 2012-11-26 NOTE — ED Notes (Signed)
She states she simply tripped and fell in her bathroom yesterday evening--c/o persistent right hip pain today.  She is able to bear weight on both hips.  She denies any back/head or neck injury or pain.  She is oriented x 4 with clear speech.  She moves all extremities with ease on command.

## 2012-11-26 NOTE — ED Notes (Signed)
Discharge instructions reviewed w/ pt., verbalizes understanding. No prescriptions provided at discharge. 

## 2012-11-26 NOTE — ED Provider Notes (Signed)
History     CSN: [id number]  Arrival date & time 11/26/12  1147   First MD Initiated Contact with Patient 11/26/12 1150      Chief Complaint  Patient presents with  . Fall    The history is provided by the patient.   patient reports falling in her bathroom 2 nights ago.  She injured her right hip.  She's been and both were on her right hip since then and just states it sore.  Family convinced her to come emergency department evaluated.  She tried over-the-counter pain medications with some improvement in her symptoms.  She denies numbness or weakness of her right lower extremity.  No head injury.  She denies neck pain.  No weakness of her upper lower extremities.  Her pain is mild in severity.  Her pain is worse with palpation and ambulation.  Nothing improves her pain except for rest.  Her pain is also worse when she sits down.  Past Medical History  Diagnosis Date  . Diverticulitis   . Atrial fibrillation   . LGI bleed   . Anticoagulant long-term use   . Anemia due to blood loss   . Rectal prolapse   . Diverticulosis of colon   . Chronic headaches   . Gallstones   . IBS (irritable bowel syndrome)   . Bowel obstruction   . UTI (lower urinary tract infection)   . Depression   . Anxiety     Past Surgical History  Procedure Laterality Date  . Appendectomy    . Cholescystectomy    . Abdominal hysterectomy    . Small intestine surgery      Family History  Problem Relation Age of Onset  . Colon cancer Sister   . Heart disease Sister     History  Substance Use Topics  . Smoking status: Former Smoker    Quit date: 08/04/1983  . Smokeless tobacco: Never Used  . Alcohol Use: No    OB History   Grav Para Term Preterm Abortions TAB SAB Ect Mult Living                  Review of Systems  All other systems reviewed and are negative.    Allergies  Codeine; Morphine and related; and Promethazine hcl  Home Medications   Current Outpatient Rx  Name  Route  Sig   Dispense  Refill  . aspirin 81 MG chewable tablet   Oral   Chew 81 mg by mouth daily.         . cholecalciferol (VITAMIN D) 1000 UNITS tablet   Oral   Take 1,000 Units by mouth daily.         . clonazePAM (KLONOPIN) 0.5 MG tablet   Oral   Take 0.5 mg by mouth daily as needed. anxiety          . docusate sodium (COLACE) 100 MG capsule   Oral   Take 100 mg by mouth daily.         Marland Kitchen escitalopram (LEXAPRO) 5 MG tablet   Oral   Take 5 mg by mouth daily.           . magnesium citrate SOLN   Oral   Take 1 Bottle by mouth daily as needed (FOR CONSTIPATION).         Marland Kitchen vitamin B-12 (CYANOCOBALAMIN) 500 MCG tablet   Oral   Take 500 mcg by mouth daily.  BP 153/80  Pulse 89  Temp(Src) 97.9 F (36.6 C) (Oral)  SpO2 95%  Physical Exam  Nursing note and vitals reviewed. Constitutional: She is oriented to person, place, and time. She appears well-developed and well-nourished. No distress.  HENT:  Head: Normocephalic and atraumatic.  Eyes: EOM are normal.  Neck: Normal range of motion.  Cardiovascular: Normal rate, regular rhythm and normal heart sounds.   Pulmonary/Chest: Effort normal and breath sounds normal.  Abdominal: Soft. She exhibits no distension. There is no tenderness.  Musculoskeletal: Normal range of motion.  Tenderness of right lateral hip.  Full range of motion of right hip.  Normal pulses and motor in her right lower extremity.  No unilateral leg swelling.  Ambulatory in the room.  Neurological: She is alert and oriented to person, place, and time.  Skin: Skin is warm and dry.  Psychiatric: She has a normal mood and affect. Judgment normal.    ED Course  Procedures (including critical care time)  Labs Reviewed - No data to display Dg Hip Complete Right  11/26/2012  *RADIOLOGY REPORT*  Clinical Data: Pain in the right buttock after a fall.  RIGHT HIP - COMPLETE 2+ VIEW  Comparison: No priors.  Findings: AP view of the pelvis and AP and  lateral views of the right hip demonstrate no acute displaced pelvic fracture, and no fracture, subluxation or dislocation of the right hip.  Numerous sutures are seen projecting over the lower anatomic pelvis.  IMPRESSION: 1.  No acute radiographic abnormality of the bony pelvis of the right hip.   Original Report Authenticated By: Trudie Reed, M.D.    I personally reviewed the imaging tests through PACS system I reviewed available ER/hospitalization records through the EMR   1. Right hip pain   2. Fall, initial encounter       MDM  Ambulatory in the room.  Likely contusion.  Discharge home with PCP followup        Lyanne Co, MD 11/26/12 [phone]

## 2013-03-07 ENCOUNTER — Other Ambulatory Visit: Payer: Self-pay | Admitting: Internal Medicine

## 2013-03-08 ENCOUNTER — Other Ambulatory Visit: Payer: Self-pay | Admitting: Internal Medicine

## 2013-03-08 DIAGNOSIS — R609 Edema, unspecified: Secondary | ICD-10-CM

## 2013-03-09 ENCOUNTER — Ambulatory Visit
Admission: RE | Admit: 2013-03-09 | Discharge: 2013-03-09 | Disposition: A | Discharge status: Home / Self Care | Payer: Medicare Other | Source: Ambulatory Visit | Attending: Internal Medicine | Admitting: Internal Medicine

## 2013-03-09 DIAGNOSIS — R609 Edema, unspecified: Secondary | ICD-10-CM

## 2013-07-30 ENCOUNTER — Encounter (HOSPITAL_COMMUNITY): Payer: Self-pay | Admitting: Emergency Medicine

## 2013-07-30 ENCOUNTER — Emergency Department (HOSPITAL_COMMUNITY): Payer: Medicare Other

## 2013-07-30 ENCOUNTER — Other Ambulatory Visit: Payer: Self-pay

## 2013-07-30 ENCOUNTER — Emergency Department (HOSPITAL_COMMUNITY)
Admission: EM | Admit: 2013-07-30 | Discharge: 2013-07-30 | Disposition: A | Discharge status: Home / Self Care | Payer: Medicare Other | Attending: Emergency Medicine | Admitting: Emergency Medicine

## 2013-07-30 DIAGNOSIS — R059 Cough, unspecified: Secondary | ICD-10-CM | POA: Insufficient documentation

## 2013-07-30 DIAGNOSIS — R05 Cough: Secondary | ICD-10-CM | POA: Insufficient documentation

## 2013-07-30 DIAGNOSIS — Z87891 Personal history of nicotine dependence: Secondary | ICD-10-CM | POA: Insufficient documentation

## 2013-07-30 DIAGNOSIS — J441 Chronic obstructive pulmonary disease with (acute) exacerbation: Secondary | ICD-10-CM

## 2013-07-30 DIAGNOSIS — K589 Irritable bowel syndrome without diarrhea: Secondary | ICD-10-CM | POA: Insufficient documentation

## 2013-07-30 DIAGNOSIS — F3289 Other specified depressive episodes: Secondary | ICD-10-CM | POA: Insufficient documentation

## 2013-07-30 DIAGNOSIS — F329 Major depressive disorder, single episode, unspecified: Secondary | ICD-10-CM | POA: Insufficient documentation

## 2013-07-30 DIAGNOSIS — R0602 Shortness of breath: Secondary | ICD-10-CM | POA: Insufficient documentation

## 2013-07-30 DIAGNOSIS — R062 Wheezing: Secondary | ICD-10-CM | POA: Insufficient documentation

## 2013-07-30 DIAGNOSIS — Z7982 Long term (current) use of aspirin: Secondary | ICD-10-CM | POA: Insufficient documentation

## 2013-07-30 DIAGNOSIS — F411 Generalized anxiety disorder: Secondary | ICD-10-CM | POA: Insufficient documentation

## 2013-07-30 DIAGNOSIS — Z79899 Other long term (current) drug therapy: Secondary | ICD-10-CM | POA: Insufficient documentation

## 2013-07-30 LAB — CBC WITH DIFFERENTIAL/PLATELET
Basophils Absolute: 0 10*3/uL (ref 0.0–0.1)
Basophils Relative: 0 % (ref 0–1)
Eosinophils Absolute: 0 10*3/uL (ref 0.0–0.7)
Eosinophils Relative: 1 % (ref 0–5)
MCH: 31.8 pg (ref 26.0–34.0)
MCV: 94.1 fL (ref 78.0–100.0)
Neutrophils Relative %: 48 % (ref 43–77)
Platelets: 164 10*3/uL (ref 150–400)
RBC: 3.74 MIL/uL — ABNORMAL LOW (ref 3.87–5.11)
RDW: 13.5 % (ref 11.5–15.5)

## 2013-07-30 LAB — BASIC METABOLIC PANEL
Calcium: 8.9 mg/dL (ref 8.4–10.5)
GFR calc Af Amer: >90 mL/min (ref >90)
GFR calc non Af Amer: 82 mL/min — ABNORMAL LOW (ref >90)
Potassium: 3.9 mEq/L (ref 3.5–5.1)
Sodium: 137 mEq/L (ref 135–145)

## 2013-07-30 LAB — TROPONIN I: Troponin I: <0.3 ng/mL (ref <0.30)

## 2013-07-30 MED ORDER — ALBUTEROL SULFATE HFA 108 (90 BASE) MCG/ACT IN AERS: 2.0000 | INHALATION_SPRAY | RESPIRATORY_TRACT | Status: AC | PRN

## 2013-07-30 MED ORDER — PREDNISONE 10 MG PO TABS: 60.0000 mg | ORAL_TABLET | Freq: Every day | ORAL | Status: DC

## 2013-07-30 MED ORDER — SODIUM CHLORIDE 0.9 % IV SOLN: 1000.0000 mL | INTRAVENOUS | Status: DC

## 2013-07-30 MED ORDER — SODIUM CHLORIDE 0.9 % IV SOLN
1000.0000 mL | Freq: Once | INTRAVENOUS | Status: AC
  Administered 2013-07-30: 1000 mL via INTRAVENOUS

## 2013-07-30 MED ORDER — ALBUTEROL SULFATE (5 MG/ML) 0.5% IN NEBU
5.0000 mg | INHALATION_SOLUTION | Freq: Once | RESPIRATORY_TRACT | Status: AC
  Administered 2013-07-30: 5 mg via RESPIRATORY_TRACT
  Filled 2013-07-30: qty 1

## 2013-07-30 MED ORDER — IPRATROPIUM BROMIDE 0.02 % IN SOLN
0.5000 mg | Freq: Once | RESPIRATORY_TRACT | Status: AC
  Administered 2013-07-30: 0.5 mg via RESPIRATORY_TRACT
  Filled 2013-07-30: qty 2.5

## 2013-07-30 MED ORDER — ALBUTEROL SULFATE HFA 108 (90 BASE) MCG/ACT IN AERS
2.0000 | INHALATION_SPRAY | RESPIRATORY_TRACT | Status: DC
  Administered 2013-07-30: 2 via RESPIRATORY_TRACT
  Filled 2013-07-30: qty 6.7

## 2013-07-30 MED ORDER — PREDNISONE 20 MG PO TABS
60.0000 mg | ORAL_TABLET | Freq: Once | ORAL | Status: AC
  Administered 2013-07-30: 60 mg via ORAL
  Filled 2013-07-30: qty 3

## 2013-07-30 NOTE — ED Notes (Signed)
Pt ambulated to restroom about one hour ago. Pt very unsteady on her feet. Unable to ambulate with one person assist

## 2013-07-30 NOTE — ED Provider Notes (Signed)
CSN: [id number]     Arrival date & time 07/30/13  0944 History   First MD Initiated Contact with Patient 07/30/13 1039     Chief Complaint  Patient presents with  . Pneumonia    HPI Page his right emergency department because of shortness of breath last night.  She reports only very mild shortness of breath as this time.  She denies orthopnea.  No history of congestive heart failure.  No known history of COPD however she was told that her chest x-ray is concerning for "emphysema".  She does not take steroids at home.  She's never been on albuterol before.  She reports productive cough.  No unilateral leg swelling.  No history of DVT or pulmonary embolism.  No recent long travel or surgery.  Symptoms are mild in severity.   Past Medical History  Diagnosis Date  . Diverticulitis   . Atrial fibrillation   . LGI bleed   . Anticoagulant long-term use   . Anemia due to blood loss   . Rectal prolapse   . Diverticulosis of colon   . Chronic headaches   . Gallstones   . IBS (irritable bowel syndrome)   . Bowel obstruction   . UTI (lower urinary tract infection)   . Depression   . Anxiety    Past Surgical History  Procedure Laterality Date  . Appendectomy    . Cholescystectomy    . Abdominal hysterectomy    . Small intestine surgery     Family History  Problem Relation Age of Onset  . Colon cancer Sister   . Heart disease Sister    History  Substance Use Topics  . Smoking status: Former Smoker    Quit date: 08/04/1983  . Smokeless tobacco: Never Used  . Alcohol Use: No   OB History   Grav Para Term Preterm Abortions TAB SAB Ect Mult Living                 Review of Systems  All other systems reviewed and are negative.    Allergies  Codeine; Morphine and related; and Promethazine hcl  Home Medications   Current Outpatient Rx  Name  Route  Sig  Dispense  Refill  . aspirin 81 MG chewable tablet   Oral   Chew 81 mg by mouth daily.         . cholecalciferol  (VITAMIN D) 1000 UNITS tablet   Oral   Take 1,000 Units by mouth daily.         . clonazePAM (KLONOPIN) 0.5 MG tablet   Oral   Take 0.5 mg by mouth daily as needed. anxiety          . dextromethorphan-guaiFENesin (MUCINEX DM) 30-600 MG per 12 hr tablet   Oral   Take 1 tablet by mouth 2 (two) times daily.         Marland Kitchen docusate sodium (COLACE) 100 MG capsule   Oral   Take 100 mg by mouth daily.         Marland Kitchen escitalopram (LEXAPRO) 5 MG tablet   Oral   Take 5 mg by mouth daily.          Marland Kitchen levofloxacin (LEVAQUIN) 500 MG tablet   Oral   Take 500 mg by mouth daily.         . Pediatric Multiple Vit-C-FA (PEDIATRIC MULTIVITAMIN) chewable tablet   Oral   Chew 1 tablet by mouth daily.         Marland Kitchen  vitamin B-12 (CYANOCOBALAMIN) 500 MCG tablet   Oral   Take 500 mcg by mouth daily.          BP 131/76  Pulse 93  Temp(Src) 97.9 F (36.6 C) (Oral)  Resp 16  SpO2 98% Physical Exam  Nursing note and vitals reviewed. Constitutional: She is oriented to person, place, and time. She appears well-developed and well-nourished. No distress.  HENT:  Head: Normocephalic and atraumatic.  Eyes: EOM are normal.  Neck: Normal range of motion.  Cardiovascular: Normal rate, regular rhythm and normal heart sounds.   Pulmonary/Chest: Effort normal. She has wheezes.  Abdominal: Soft. She exhibits no distension. There is no tenderness.  Musculoskeletal: Normal range of motion.  Neurological: She is alert and oriented to person, place, and time.  Skin: Skin is warm and dry.  Psychiatric: She has a normal mood and affect. Judgment normal.    ED Course  Procedures (including critical care time) Labs Review Labs Reviewed  CBC WITH DIFFERENTIAL - Abnormal; Notable for the following:    WBC 2.4 (*)    RBC 3.74 (*)    Hemoglobin 11.9 (*)    HCT 35.2 (*)    Neutro Abs 1.1 (*)    Monocytes Relative 18 (*)    All other components within normal limits  BASIC METABOLIC PANEL - Abnormal;  Notable for the following:    GFR calc non Af Amer 82 (*)    All other components within normal limits  CULTURE, BLOOD (ROUTINE X 2)  CULTURE, BLOOD (ROUTINE X 2)  TROPONIN I   Imaging Review Dg Chest 2 View  07/30/2013   CLINICAL DATA:  Shortness of breath, cough, chest tightness, pneumonia  EXAM: CHEST  2 VIEW  COMPARISON:  01/12/2013  FINDINGS: Enlargement of cardiac silhouette.  Atherosclerotic calcification aorta.  Mediastinal contours and pulmonary vascularity normal.  Emphysematous and bronchitic changes consistent with COPD.  Chronic interstitial prominence stable.  No definite acute infiltrate, pleural effusion or pneumothorax.  Bones demineralized.  IMPRESSION: COPD changes with chronic interstitial prominence.  No acute abnormalities.   Electronically Signed   By: Ulyses Southward M.D.   On: 07/30/2013 11:47  I personally reviewed the imaging tests through PACS system I reviewed available ER/hospitalization records through the EMR   ECG interpretation   Date: 07/30/2013  Rate: 98  Rhythm: atrial fibrillation  QRS Axis: normal  Intervals: normal  ST/T Wave abnormalities: normal  Conduction Disutrbances: none  Narrative Interpretation: PVC  Old EKG Reviewed: No significant changes noted     MDM  No diagnosis found. Patient feels better after breathing treatment.  I suspect this is COPD exacerbation.  Outpatient pulmonary followup.  Home with albuterol and prednisone.  Patient will continue the Levaquin that was initiated yesterday by the urgent care.  Discharge home in good condition.  No indication for admission.  Family understands return the ER for new or worsening symptoms    Lyanne Co, MD 07/30/13 [phone]

## 2013-07-30 NOTE — ED Notes (Signed)
Dr. Campos at bedside   

## 2013-07-30 NOTE — ED Notes (Signed)
Per pt/family-cold symptoms on and off for weeks-diagnosed pneumonia yesterday at urgent care-trouble breathing last night-was told to come to ED if symptoms got worse

## 2013-08-05 LAB — CULTURE, BLOOD (ROUTINE X 2)
Culture: NO GROWTH
Culture: NO GROWTH

## 2013-08-08 ENCOUNTER — Encounter: Payer: Self-pay | Admitting: Internal Medicine

## 2013-08-09 ENCOUNTER — Ambulatory Visit (INDEPENDENT_AMBULATORY_CARE_PROVIDER_SITE_OTHER): Payer: Medicare Other | Admitting: Internal Medicine

## 2013-08-09 ENCOUNTER — Encounter: Payer: Self-pay | Admitting: Internal Medicine

## 2013-08-09 VITALS — BP 120/62 | HR 94 | Temp 97.7°F | Ht 64.0 in | Wt 114.4 lb

## 2013-08-09 DIAGNOSIS — J449 Chronic obstructive pulmonary disease, unspecified: Secondary | ICD-10-CM

## 2013-08-09 DIAGNOSIS — I4891 Unspecified atrial fibrillation: Secondary | ICD-10-CM

## 2013-08-09 DIAGNOSIS — J45909 Unspecified asthma, uncomplicated: Secondary | ICD-10-CM | POA: Insufficient documentation

## 2013-08-09 DIAGNOSIS — J9610 Chronic respiratory failure, unspecified whether with hypoxia or hypercapnia: Secondary | ICD-10-CM

## 2013-08-09 NOTE — Progress Notes (Signed)
   Subjective:    Patient ID: Emily Wyatt, female    DOB: 1926-08-13  MRN: [id number]  HPI   [age] yowm quit smoking 1985 with sinus drainage for decades then sob with  coughing in Nov 2014 zpak got some better then worse again mid Dec 2014 rx levaquin and then breathing worse referred 08/09/2013 to pulmonary clinic for ? Copd.  08/09/2013 1st [facility] office visit/ Shakil Dirk   Chief Complaint  Patient presents with  . Pulmonary Consult    Referred per Dr. Pearson GrippeJames Kim. Pt states recently dxed with COPD w/ Emphysema.  She went to ED with SOB on 07/30/13 for COPD exacerbation. Her breathing is some better since then. She has prod cough with clear sputum.   doe x more than slow walk baseline,  Much better with albuterol neb but makes her heart race - has tried various hfa's but can't use them effectively   No obvious day to day or daytime variabilty or assoc  cp or chest tightness, subjective wheeze overt sinus or hb symptoms. No unusual exp hx or h/o childhood pna/ asthma or knowledge of premature birth.  Sleeping ok without nocturnal  or early am exacerbation  of respiratory  c/o's or need for noct saba. Also denies any obvious fluctuation of symptoms with weather or environmental changes or other aggravating or alleviating factors except as outlined above   Current Medications, Allergies, Complete Past Medical History, Past Surgical History, Family History, and Social History were reviewed in [facility] electronic medical record.          .   Review of Systems  Constitutional: Positive for appetite change. Negative for fever, chills and unexpected weight change.  HENT: Positive for congestion. Negative for dental problem, ear pain, nosebleeds, postnasal drip, rhinorrhea, sinus pressure, sneezing, sore throat, trouble swallowing and voice change.   Eyes: Negative for visual disturbance.  Respiratory: Positive for cough and shortness of breath. Negative for choking.   Cardiovascular:  Negative for chest pain and leg swelling.  Gastrointestinal: Negative for vomiting, abdominal pain and diarrhea.  Genitourinary: Negative for difficulty urinating.  Musculoskeletal: Positive for arthralgias.  Skin: Negative for rash.  Neurological: Positive for headaches. Negative for tremors and syncope.  Hematological: Does not bruise/bleed easily.       Objective:   Physical Exam  Wt Readings from Last 3 Encounters:  08/09/13 114 lb 6.4 oz (51.891 kg)  10/29/11 120 lb (54.432 kg)  10/27/11 122 lb 9.6 oz (55.611 kg)     amb wf very hard of hearing but also difficulty to cognitive function apparent  HEENT mild turbinate edema.  Oropharynx no thrush or excess pnd or cobblestoning.  No JVD or cervical adenopathy. Mild accessory muscle hypertrophy. Trachea midline, nl thryroid. Chest was hyperinflated by percussion with diminished breath sounds and moderate increased exp time without wheeze. Hoover sign positive at mid inspiration. Regular rate and rhythm without murmur gallop or rub or increase P2 or edema.  Abd: no hsm, nl excursion. Ext warm without cyanosis or clubbing.    07/30/13 COPD changes with chronic interstitial prominence.  No acute abnormalities.   08/09/12 EKG afib / rate around 100, no ischemic changes >same as er 07/30/13     Assessment & Plan:

## 2013-08-09 NOTE — Patient Instructions (Addendum)
Plan A = automatic = anoro one puff each am  Plan B = backup for difficulty breathing  Only use your albuterol (ventolin = proair) inhaler as a rescue medication to be used if you can't catch your breath by resting or doing a relaxed purse lip breathing pattern.  - The less you use it, the better it will work when you need it. - Ok to use up to 2 puffs  every 4 hours if needed - Don't leave home without it !!  (think of it like your spare tire for your car)  - only use neb albuterol if the inhaler fails to work - ok to use up to every 4 hours but always try your inhaler first   Please schedule a follow up office visit in 2  weeks, sooner if needed

## 2013-08-10 NOTE — Assessment & Plan Note (Addendum)
-   08/09/2013   Walked RA x  10 ft stopped due to desat to 83% from baseline of 96%    hfa 0% p coaching x 5 min  She appears to be overusing B2 at baseline from neb as can't use the hfa effectively  With not much evidence of ab so will try anoro on puff each am and see how much she needs to continue hfa or neb saba. Will regroup in 2 weeks and do f/u pfts if feasible    Each maintenance medication was reviewed in detail including most importantly the difference between maintenance and as needed and under what circumstances the prns are to be used.  Please see instructions for details which were reviewed in writing and the patient given a copy.

## 2013-08-11 ENCOUNTER — Telehealth: Payer: Self-pay | Admitting: *Deleted

## 2013-08-11 DIAGNOSIS — J9610 Chronic respiratory failure, unspecified whether with hypoxia or hypercapnia: Secondary | ICD-10-CM | POA: Insufficient documentation

## 2013-08-11 NOTE — Telephone Encounter (Signed)
LMTCB

## 2013-08-11 NOTE — Telephone Encounter (Signed)
Message copied by Christen ButterASKIN, Zuhair Lariccia M on Fri Aug 11, 2013  2:09 PM ------      Message from: Sandrea HughsWERT, MICHAEL B      Created: Fri Aug 11, 2013  5:40 AM       See if she plans f/u soon with Dr Selena BattenKim re Afib ------

## 2013-08-11 NOTE — Assessment & Plan Note (Signed)
Appears chronic, has f/u planned

## 2013-08-16 NOTE — Telephone Encounter (Signed)
Spoke with the pt  She states that she plans to see Dr Selena BattenKim next wk and will keep appt with us as well next wk  Nothing further needed

## 2013-08-23 ENCOUNTER — Encounter: Payer: Self-pay | Admitting: Internal Medicine

## 2013-08-23 ENCOUNTER — Ambulatory Visit (INDEPENDENT_AMBULATORY_CARE_PROVIDER_SITE_OTHER): Payer: Medicare Other | Admitting: Internal Medicine

## 2013-08-23 VITALS — BP 122/64 | HR 116 | Temp 97.6°F | Ht 66.0 in | Wt 114.0 lb

## 2013-08-23 DIAGNOSIS — I4891 Unspecified atrial fibrillation: Secondary | ICD-10-CM

## 2013-08-23 DIAGNOSIS — J449 Chronic obstructive pulmonary disease, unspecified: Secondary | ICD-10-CM

## 2013-08-23 MED ORDER — UMECLIDINIUM-VILANTEROL 62.5-25 MCG/INH IN AEPB: 1.0000 | INHALATION_SPRAY | Freq: Two times a day (BID) | RESPIRATORY_TRACT | Status: DC

## 2013-08-23 NOTE — Progress Notes (Signed)
Subjective:    Patient ID: Emily Wyatt, female    DOB: Sep 10, 1926  MRN: [id number]  HPI   18 yowm quit smoking 1985 with sinus drainage for decades then sob with  coughing in Nov 2014 zpak got some better then worse again mid Dec 2014 rx levaquin and then breathing worse referred 08/09/2013 to pulmonary clinic for ? Copd.  08/09/2013 1st [facility] office visit/ Wert   Chief Complaint  Patient presents with  . Pulmonary Consult    Referred per Dr. Pearson Grippe. Pt states recently dxed with COPD w/ Emphysema.  She went to ED with SOB on 07/30/13 for COPD exacerbation. Her breathing is some better since then. She has prod cough with clear sputum.   doe x more than slow walk baseline,  Much better with albuterol neb but makes her heart race - has tried various hfa's but can't use them effectively  rec Plan A = automatic = anoro one puff each am Plan B = backup for difficulty breathing  Only use your albuterol (ventolin = proair) inhaler as a rescue medication    08/23/2013 f/u ov/Wert re: copd Chief Complaint  Patient presents with  . Follow-up    SOB and cough are much improved since the last visit. No new co's today.    not clear how / when using prns  But apparently not needing them now  No obvious day to day or daytime variabilty or assoc chronic cough or cp or chest tightness, subjective wheeze overt sinus or hb symptoms. No unusual exp hx or h/o childhood pna/ asthma or knowledge of premature birth.  Sleeping ok without nocturnal  or early am exacerbation  of respiratory  c/o's or need for noct saba. Also denies any obvious fluctuation of symptoms with weather or environmental changes or other aggravating or alleviating factors except as outlined above   Current Medications, Allergies, Complete Past Medical History, Past Surgical History, Family History, and Social History were reviewed in [employer] [facility] record.  ROS  The following are not active complaints  unless bolded sore throat, dysphagia, dental problems, itching, sneezing,  nasal congestion or excess/ purulent secretions, ear ache,   fever, chills, sweats, unintended wt loss, pleuritic or exertional cp, hemoptysis,  orthopnea pnd or leg swelling, presyncope, palpitations, heartburn, abdominal pain, anorexia, nausea, vomiting, diarrhea  or change in bowel or urinary habits, change in stools or urine, dysuria,hematuria,  rash, arthralgias, visual complaints, headache, numbness weakness or ataxia or problems with walking or coordination,  change in mood/affect or memory.                .   .       Objective:   Physical Exam  08/23/2013        114  Wt Readings from Last 3 Encounters:  08/09/13 114 lb 6.4 oz (51.891 kg)  10/29/11 120 lb (54.432 kg)  10/27/11 122 lb 9.6 oz (55.611 kg)     amb wf very hard of hearing but also difficulty to cognitive function apparent  HEENT mild turbinate edema.  Oropharynx no thrush or excess pnd or cobblestoning.  No JVD or cervical adenopathy. Mild accessory muscle hypertrophy. Trachea midline, nl thryroid. Chest was hyperinflated by percussion with diminished breath sounds and moderate increased exp time without wheeze. Hoover sign positive at mid inspiration. Regular rate and rhythm without murmur gallop or rub or increase P2 or edema.  Abd: no hsm, nl excursion. Ext warm without cyanosis or clubbing.  07/30/13 COPD changes with chronic interstitial prominence.  No acute abnormalities.   08/09/12 EKG afib / rate around 100, no ischemic changes >same as er 07/30/13     Assessment & Plan:

## 2013-08-23 NOTE — Assessment & Plan Note (Signed)
-   08/09/2013   Walked RA x  10 ft stopped due to desat to 83% from baseline of 96%  - 08/09/13 hfa 0% p coaching  - anoro started 08/09/2013  > improved  Since she is so much better with empirical Rx for a Group C COPD pt it is reasonable just to continue anoro one pff daily for now and see if exacerbates in which case change to [facility] might be considered given that she has mastered dpi but very poor at hfa. The other option is changing to LABA/ICS neb and spiriva as the LAMA    Each maintenance medication was reviewed in detail including most importantly the difference between maintenance and as needed and under what circumstances the prns are to be used.  Please see instructions for details which were reviewed in writing and the patient given a copy.

## 2013-08-23 NOTE — Patient Instructions (Signed)
Plan A = automatic = anoro one puff each am  Plan B = backup for difficulty breathing  Only use your albuterol (ventolin = proair) inhaler as a rescue medication to be used if you can't catch your breath by resting or doing a relaxed purse lip breathing pattern.  - The less you use it, the better it will work when you need it. - Ok to use up to 2 puffs  every 4 hours if needed - Don't leave home without it !!  (think of it like your spare tire for your car)  - only use neb albuterol if the inhaler fails to work - ok to use up to every 4 hours but always try your inhaler first  For cough use mucinex dm as needed  Pulmonary follow up can be at Dr Elmyra RicksKim's discretion

## 2013-08-23 NOTE — Assessment & Plan Note (Signed)
Rate a bit high but plans to see Dr Selena BattenKim soon for f/u and consideration of AV blockers - would avoid BB here except for bisoprolol if a BB is needed since it is the most beta selective on the market

## 2013-09-04 ENCOUNTER — Telehealth: Payer: Self-pay | Admitting: Internal Medicine

## 2013-09-04 NOTE — Telephone Encounter (Signed)
Received PA for anoro from pharm  Per MW- give samples of med and have her come in for ov to discuss  3 samples up front for pick and and spoke with the pt and notified  She verbalized understanding  Ov with MW on 09/18/13

## 2013-09-18 ENCOUNTER — Encounter: Payer: Self-pay | Admitting: Internal Medicine

## 2013-09-18 ENCOUNTER — Ambulatory Visit (INDEPENDENT_AMBULATORY_CARE_PROVIDER_SITE_OTHER): Payer: Medicare Other | Admitting: Internal Medicine

## 2013-09-18 VITALS — BP 106/80 | HR 98 | Temp 97.4°F | Ht 66.5 in | Wt 113.0 lb

## 2013-09-18 DIAGNOSIS — J449 Chronic obstructive pulmonary disease, unspecified: Secondary | ICD-10-CM

## 2013-09-18 DIAGNOSIS — J9610 Chronic respiratory failure, unspecified whether with hypoxia or hypercapnia: Secondary | ICD-10-CM

## 2013-09-18 DIAGNOSIS — J4489 Other specified chronic obstructive pulmonary disease: Secondary | ICD-10-CM

## 2013-09-18 MED ORDER — FLUTICASONE FUROATE-VILANTEROL 100-25 MCG/INH IN AEPB: 1.0000 | INHALATION_SPRAY | Freq: Every morning | RESPIRATORY_TRACT | Status: DC

## 2013-09-18 NOTE — Assessment & Plan Note (Signed)
Qualified for amb 02 08/09/13 but declined - see asthma a/p - improved so no need for 02 given her level of function (extremely sedentary)

## 2013-09-18 NOTE — Patient Instructions (Addendum)
Try breo one daily instead of anoro when this runs out and see if your breathing is good as it is now or whether you need your backup inhlaler or nebulizer  Please schedule a follow up office visit in 6 weeks, call sooner if needed

## 2013-09-18 NOTE — Progress Notes (Signed)
Subjective:    Patient ID: Emily Wyatt, female    DOB: 09-02-1926  MRN: [id number]     Brief patient profile:  [age] yowm quit smoking 1985 with sinus drainage for decades then sob with  coughing in Nov 2014 zpak got some better then worse again mid Dec 2014 rx levaquin and then breathing worse referred 08/09/2013 to pulmonary clinic for ? Copd but had no sign airflow obst on spirometry 09/18/2013 while on anoro   History of Present Illness  08/09/2013 1st [facility] office visit/ Emily Wyatt   Chief Complaint  Patient presents with  . Pulmonary Consult    Referred per Dr. Pearson GrippeJames Wyatt. Pt states recently dxed with COPD w/ Emphysema.  She went to ED with SOB on 07/30/13 for COPD exacerbation. Her breathing is some better since then. She has prod cough with clear sputum.   doe x more than slow walk baseline,  Much better with albuterol neb but makes her heart race - has tried various hfa's but can't use them effectively  rec Plan A = automatic = anoro one puff each am Plan B = backup for difficulty breathing  Only use your albuterol (ventolin = proair) inhaler as a rescue medication    08/23/2013 f/u ov/Emily Wyatt re: copd Chief Complaint  Patient presents with  . Follow-up    SOB and cough are much improved since the last visit. No new co's today.    not clear how / when using prns  But apparently not needing them now rec Plan A = automatic = anoro one puff each am Plan B = backup for difficulty breathing  Only use your albuterol (ventolin = proair) inhaler as a rescue medication  For cough use mucinex dm as needed    09/18/2013 f/u ov/Emily Wyatt re: breathing better but not able to afford anoro Chief Complaint  Patient presents with  . Follow-up    Pt states that her breathing is unchanged. Not doing much coughing. Has not needed to use rescue inhaler or neb. Needs to discuss alternative to anoro.   very sedentary but Not limited by breathing from desired activities    No obvious day to day  or daytime variabilty or assoc chronic cough or cp or chest tightness, subjective wheeze overt sinus or hb symptoms. No unusual exp hx or h/o childhood pna/ asthma or knowledge of premature birth.  Sleeping ok without nocturnal  or early am exacerbation  of respiratory  c/o's or need for noct saba. Also denies any obvious fluctuation of symptoms with weather or environmental changes or other aggravating or alleviating factors except as outlined above   Current Medications, Allergies, Complete Past Medical History, Past Surgical History, Family History, and Social History were reviewed in [facility] electronic medical record.  ROS  The following are not active complaints unless bolded sore throat, dysphagia, dental problems, itching, sneezing,  nasal congestion or excess/ purulent secretions, ear ache,   fever, chills, sweats, unintended wt loss, pleuritic or exertional cp, hemoptysis,  orthopnea pnd or leg swelling, presyncope, palpitations, heartburn, abdominal pain, anorexia, nausea, vomiting, diarrhea  or change in bowel or urinary habits, change in stools or urine, dysuria,hematuria,  rash, arthralgias, visual complaints, headache, numbness weakness or ataxia or problems with walking or coordination,  change in mood/affect or memory.                .   .       Objective:   Physical Exam  08/23/2013  114  > 09/18/2013   113  Wt Readings from Last 3 Encounters:  08/09/13 114 lb 6.4 oz (51.891 kg)  10/29/11 120 lb (54.432 kg)  10/27/11 122 lb 9.6 oz (55.611 kg)     amb wf very hard of hearing but also difficulty to cognitive function apparent  HEENT mild turbinate edema.  Oropharynx no thrush or excess pnd or cobblestoning.  No JVD or cervical adenopathy. Mild accessory muscle hypertrophy. Trachea midline, nl thryroid. Chest was hyperinflated by percussion with diminished breath sounds and moderate increased exp time without wheeze. Hoover sign positive at mid inspiration.  Regular rate and rhythm without murmur gallop or rub or increase P2 or edema.  Abd: no hsm, nl excursion. Ext warm without cyanosis or clubbing.    07/30/13 COPD changes with chronic interstitial prominence.  No acute abnormalities.   08/09/12 EKG afib / rate around 100, no ischemic changes >same as er 07/30/13     Assessment & Plan:

## 2013-09-18 NOTE — Assessment & Plan Note (Addendum)
-   08/09/2013   Walked RA x  10 ft stopped due to desat to 83% from baseline of 96%  - 09/18/2013  Walked RA  2 laps @ 185 ft each stopped due to  desat to 88 but not sob - 08/09/13 hfa 0% p coaching  - anoro started 08/09/2013  > improved 08/23/2013 >  Changed to breo 09/18/2013  - Spirometry wnl 09/18/2013 so this is not copd after all but asthma > ok to change to approved advair 250/50 bid next ov as not able to teach her mdi

## 2013-10-20 ENCOUNTER — Encounter: Payer: Self-pay | Admitting: Internal Medicine

## 2013-10-30 ENCOUNTER — Encounter: Payer: Self-pay | Admitting: Internal Medicine

## 2013-10-30 ENCOUNTER — Ambulatory Visit (INDEPENDENT_AMBULATORY_CARE_PROVIDER_SITE_OTHER): Payer: Medicare Other | Admitting: Internal Medicine

## 2013-10-30 VITALS — BP 108/70 | HR 98 | Temp 98.0°F | Ht 66.5 in | Wt 112.8 lb

## 2013-10-30 DIAGNOSIS — I4891 Unspecified atrial fibrillation: Secondary | ICD-10-CM

## 2013-10-30 DIAGNOSIS — J45909 Unspecified asthma, uncomplicated: Secondary | ICD-10-CM

## 2013-10-30 MED ORDER — FLUTICASONE-SALMETEROL 250-50 MCG/DOSE IN AEPB: 1.0000 | INHALATION_SPRAY | Freq: Two times a day (BID) | RESPIRATORY_TRACT | Status: DC

## 2013-10-30 NOTE — Assessment & Plan Note (Signed)
-   08/09/2013   Walked RA x  10 ft stopped due to desat to 83% from baseline of 96%  - 09/18/2013  Walked RA  2 laps @ 185 ft each stopped due to  desat to 88  - 08/09/13 hfa 0% p coaching  - anoro started 08/09/2013  > improved 08/23/2013 >  Changed to breo 09/18/2013  - Spirometry wnl 09/18/2013   - changed to advair 250/50 one bid due to cost considerations  Adequate control on present rx, reviewed > no change in rx needed  But she can't afford breo so will see if can afford advair  The proper method of use, as well as anticipated side effects, of a dry powder  inhaler are discussed and demonstrated to the patient. Improved effectiveness after extensive coaching during this visit to a level of approximately  90%    Each maintenance medication was reviewed in detail including most importantly the difference between maintenance and as needed and under what circumstances the prns are to be used.  Please see instructions for details which were reviewed in writing and the patient given a copy.

## 2013-10-30 NOTE — Progress Notes (Signed)
Subjective:    Patient ID: Emily Wyatt, female    DOB: 03-18-1927  MRN: [id number]     Brief patient profile:  57 yowm quit smoking 1985 with sinus drainage for decades then sob with  coughing in Nov 2014 zpak got some better then worse again mid Dec 2014 rx levaquin and then breathing worse referred 08/09/2013 to pulmonary clinic for ? Copd but had no sign airflow obst on spirometry 09/18/2013 while on anoro   History of Present Illness  08/09/2013 1st [facility] office visit/ Wert   Chief Complaint  Patient presents with  . Pulmonary Consult    Referred per Dr. Pearson Grippe. Pt states recently dxed with COPD w/ Emphysema.  She went to ED with SOB on 07/30/13 for COPD exacerbation. Her breathing is some better since then. She has prod cough with clear sputum.   doe x more than slow walk baseline,  Much better with albuterol neb but makes her heart race - has tried various hfa's but can't use them effectively  rec Plan A = automatic = anoro one puff each am Plan B = backup for difficulty breathing  Only use your albuterol (ventolin = proair) inhaler as a rescue medication    08/23/2013 f/u ov/Wert re: copd Chief Complaint  Patient presents with  . Follow-up    SOB and cough are much improved since the last visit. No new co's today.    not clear how / when using prns  But apparently not needing them now rec Plan A = automatic = anoro one puff each am Plan B = backup for difficulty breathing  Only use your albuterol (ventolin = proair) inhaler as a rescue medication  For cough use mucinex dm as needed    09/18/2013 f/u ov/Wert re: breathing better but not able to afford anoro Chief Complaint  Patient presents with  . Follow-up    Pt states that her breathing is unchanged. Not doing much coughing. Has not needed to use rescue inhaler or neb. Needs to discuss alternative to anoro.   very sedentary but Not limited by breathing from desired activities   rec Try breo one daily  instead of anoro when this runs out and see if your breathing is good as it is now or whether you need your backup inhlaler or nebulizer   10/30/2013 f/u ov/Wert re: chronic asthma / breathing better on breo overall but can't afford it Chief Complaint  Patient presents with  . Follow-up    Pt states her breathing is unchanged since last visit. No new co's today.    no need for saba in any form  No obvious day to day or daytime variabilty or assoc chronic cough or cp or chest tightness, subjective wheeze overt sinus or hb symptoms. No unusual exp hx or h/o childhood pna/ asthma or knowledge of premature birth.  Sleeping ok without nocturnal  or early am exacerbation  of respiratory  c/o's or need for noct saba. Also denies any obvious fluctuation of symptoms with weather or environmental changes or other aggravating or alleviating factors except as outlined above   Current Medications, Allergies, Complete Past Medical History, Past Surgical History, Family History, and Social History were reviewed in [employer] record.  ROS  The following are not active complaints unless bolded sore throat, dysphagia, dental problems, itching, sneezing,  nasal congestion or excess/ purulent secretions, ear ache,   fever, chills, sweats, unintended wt loss, pleuritic or exertional cp, hemoptysis,  orthopnea pnd or leg swelling, presyncope, palpitations, heartburn, abdominal pain, anorexia, nausea, vomiting, diarrhea  or change in bowel or urinary habits, change in stools or urine, dysuria,hematuria,  rash, arthralgias, visual complaints, headache, numbness weakness or ataxia or problems with walking or coordination,  change in mood/affect or memory.                .   .       Objective:   Physical Exam  08/23/2013        114  > 09/18/2013   113 > 10/30/2013  113  Wt Readings from Last 3 Encounters:  08/09/13 114 lb 6.4 oz (51.891 kg)  10/29/11 120 lb (54.432 kg)  10/27/11 122  lb 9.6 oz (55.611 kg)     amb wf very hard of hearing but also difficulty with cognitive function apparent  HEENT mild turbinate edema.  Oropharynx no thrush or excess pnd or cobblestoning.  No JVD or cervical adenopathy. Mild accessory muscle hypertrophy. Trachea midline, nl thryroid. Chest was hyperinflated by percussion with diminished breath sounds and moderate increased exp time without wheeze. Hoover sign positive at mid inspiration. Regular rate and rhythm without murmur gallop or rub or increase P2 or edema.  Abd: no hsm, nl excursion. Ext warm without cyanosis or clubbing.    07/30/13 COPD changes with chronic interstitial prominence.  No acute abnormalities.   08/09/12 EKG afib / rate around 100, no ischemic changes >same as er 07/30/13     Assessment & Plan:

## 2013-10-30 NOTE — Assessment & Plan Note (Signed)
Concerned may also have component of sob related to RAF wit exercise and that increased use of saba may contribute to this and paradoxically worsen breathing so rec f/u with Dr Selena BattenKim and here prn increase need for saba use for any reason.

## 2013-10-30 NOTE — Patient Instructions (Addendum)
advair 250 / 50 one twice daily when breo runs out  If start needing to use rescue medication need  to return right away, otherwise follow up can be through Dr Selena BattenKim

## 2013-12-20 ENCOUNTER — Telehealth: Payer: Self-pay | Admitting: Internal Medicine

## 2013-12-20 NOTE — Telephone Encounter (Signed)
I called and spoke with pt daughter Bonita QuinLinda. She will check pt formulary and call us back to let us know what is least expensive for pt. Nothing further needed

## 2014-06-22 ENCOUNTER — Encounter: Payer: Self-pay | Admitting: Internal Medicine

## 2014-07-20 ENCOUNTER — Ambulatory Visit: Payer: Medicare Other | Admitting: Cardiology

## 2014-08-09 ENCOUNTER — Ambulatory Visit (INDEPENDENT_AMBULATORY_CARE_PROVIDER_SITE_OTHER): Payer: 59 | Admitting: Internal Medicine

## 2014-08-09 ENCOUNTER — Encounter: Payer: Self-pay | Admitting: Internal Medicine

## 2014-08-09 VITALS — BP 112/72 | HR 118 | Ht 66.0 in | Wt 105.7 lb

## 2014-08-09 DIAGNOSIS — Z7901 Long term (current) use of anticoagulants: Secondary | ICD-10-CM | POA: Diagnosis not present

## 2014-08-09 DIAGNOSIS — I4819 Other persistent atrial fibrillation: Secondary | ICD-10-CM

## 2014-08-09 DIAGNOSIS — I481 Persistent atrial fibrillation: Secondary | ICD-10-CM

## 2014-08-09 DIAGNOSIS — Z01810 Encounter for preprocedural cardiovascular examination: Secondary | ICD-10-CM | POA: Insufficient documentation

## 2014-08-09 MED ORDER — DILTIAZEM HCL ER BEADS 120 MG PO CP24: 120.0000 mg | ORAL_CAPSULE | Freq: Every day | ORAL | Status: DC

## 2014-08-09 NOTE — Progress Notes (Signed)
OFFICE NOTE  Chief Complaint:  Atrial fibrillation  Primary Care Physician: Pearson Grippe, MD  HPI:  Emily Wyatt is a pleasant 81 year old female who is currently referred to me for evaluation of atrial fibrillation. She does have a history of A. fib in the past and was present followed by Dr. Lynnea Ferrier. In 2010 or 2011 she was first diagnosed with A. fib. This is in the setting of small bowel obstruction. She underwent colectomy [facility]. Subsequently she was placed on warfarin and amiodarone for her A. fib. Eventually she was weaned off of the amiodarone and taken off of warfarin. She had no recurrence that we were aware for some time until recently when perioperative evaluation demonstrated abnormal rhythm suggestive of atrial fibrillation. She recently underwent cataract surgery and is planning on another cataract procedure and is here today for preoperative risk assessment. In the office she was noted to be in A. fib with RVR with rates into the 120s. She is completely asymptomatic. She does not have a history of hypertension although this is documented in her charts, she is not on medications as never had blood pressures sustained greater than 130/90. That being said, her only stroke risk factors the fact that she is in her 73s. Her CHADSVASC score is 1. She is currently on no AV nodal blocking agents.  PMHx:  Past Medical History  Diagnosis Date  . Diverticulitis   . Atrial fibrillation   . LGI bleed   . Anticoagulant long-term use   . Anemia due to blood loss   . Rectal prolapse   . Diverticulosis of colon   . Chronic headaches   . Gallstones   . IBS (irritable bowel syndrome)   . Bowel obstruction   . UTI (lower urinary tract infection)   . Depression   . Anxiety     Past Surgical History  Procedure Laterality Date  . Appendectomy    . Cholescystectomy    . Abdominal hysterectomy    . Small intestine surgery      FAMHx:  Family History  Problem Relation  Age of Onset  . Colon cancer Sister   . Heart disease Sister     SOCHx:   reports that she quit smoking about 31 years ago. Her smoking use included Cigarettes. She has a 2.5 pack-year smoking history. She has never used smokeless tobacco. She reports that she does not drink alcohol or use illicit drugs.  ALLERGIES:  Allergies  Allergen Reactions  . Codeine Nausea And Vomiting  . Morphine And Related Nausea And Vomiting  . Promethazine Hcl     UNKNOWN    ROS: A comprehensive review of systems was negative except for: Eyes: positive for cataracts  HOME MEDS: Current Outpatient Prescriptions  Medication Sig Dispense Refill  . albuterol (PROVENTIL HFA;VENTOLIN HFA) 108 (90 BASE) MCG/ACT inhaler Inhale 2 puffs into the lungs every 4 (four) hours as needed for wheezing or shortness of breath. 1 Inhaler 0  . aspirin 325 MG EC tablet Take 325 mg by mouth daily.     . B Complex Vitamins (VITAMIN B-COMPLEX PO) Take by mouth daily.    Marland Kitchen BIOTIN PO Take 1,000 mg by mouth daily.     . cholecalciferol (VITAMIN D) 1000 UNITS tablet Take 1,000 Units by mouth daily.    Marland Kitchen Dextromethorphan-Guaifenesin (MUCINEX DM PO) Take by mouth 2 (two) times daily.    Marland Kitchen escitalopram (LEXAPRO) 10 MG tablet Take 15 mg by mouth daily.    Marland Kitchen  Potassium Gluconate 595 MG CAPS Take 1 capsule by mouth daily.    . Probiotic Product (PROBIOTIC PO) Take by mouth daily.    Marland Kitchen. diltiazem (TIAZAC) 120 MG 24 hr capsule Take 1 capsule (120 mg total) by mouth daily. 30 capsule 6   No current facility-administered medications for this visit.    LABS/IMAGING: No results found for this or any previous visit (from the past 48 hour(s)). No results found.  VITALS: BP 112/72 mmHg  Pulse 118  Ht 5\' 6"  (1.676 m)  Wt 105 lb 11.2 oz (47.945 kg)  BMI 17.07 kg/m2  EXAM: General appearance: alert and no distress Neck: no carotid bruit and no JVD Lungs: clear to auscultation bilaterally Heart: irregularly irregular rhythm Abdomen:  soft, non-tender; bowel sounds normal; no masses,  no organomegaly Extremities: extremities normal, atraumatic, no cyanosis or edema Pulses: 2+ and symmetric Skin: Skin color, texture, turgor normal. No rashes or lesions Neurologic: Grossly normal Psych: Pleasant  EKG: Atrial fibrillation with rapid ventricular response at 118  ASSESSMENT: 1. Persistent atrial fibrillation with rapid ventricular response 2. Low risk for cataract surgery 3. Poor anticoagulation candidate due to falls and memory loss 4. CHADSVASC score of 1  PLAN: 1.   [name] has atrial fibrillation with rapid ventricular response. It seems to be more persisted although she has a history of it in the past. She is not a good anticoagulation candidate due to her age and recent falls as well as memory loss and early dementia. Additionally, her CHADSVASC score is low and therefore aspirin may be a good alternative. I'm recommending she start on aspirin 325 mg daily instead of the 162.5 mg daily she is currently taking. In addition she needs some better rate control. I would recommend low-dose Cardizem CD 120 mg daily. Plan to see her back in a month with hopefully better rate control. We did discuss the possibility of anticoagulation, and/or cardioversion, but she does not want to be aggressive or undergo any type of procedure. Given the fact she is asymptomatic and her age, I think this is very reasonable. We also do not know how long she's been in atrial fibrillation and therefore may have a low likelihood of getting her back to sinus rhythm. From a cardiac standpoint once her rate is well-controlled, she would be an acceptable candidate for cataract surgery, a low risk procedure.  Thanks for the consult.  Chrystie NoseKenneth C. Markesha Hannig, MD, [facility] Attending Cardiologist [facility]  Arlette Schaad C 08/09/2014, 5:10 PM

## 2014-08-09 NOTE — Patient Instructions (Signed)
Your physician has recommended you make the following change in your medication...  1. INCREASE aspirin to 325mg  daily (WHOLE TABLET) 2. START diltiazem 120mg  once daily (for your heart rate)  Your physician recommends that you schedule a follow-up appointment in: 1 month.

## 2014-09-10 ENCOUNTER — Ambulatory Visit (INDEPENDENT_AMBULATORY_CARE_PROVIDER_SITE_OTHER): Payer: 59 | Admitting: Internal Medicine

## 2014-09-10 ENCOUNTER — Encounter: Payer: Self-pay | Admitting: Internal Medicine

## 2014-09-10 VITALS — BP 126/76 | HR 108 | Ht 64.5 in | Wt 104.9 lb

## 2014-09-10 DIAGNOSIS — Z7901 Long term (current) use of anticoagulants: Secondary | ICD-10-CM | POA: Diagnosis not present

## 2014-09-10 DIAGNOSIS — I4819 Other persistent atrial fibrillation: Secondary | ICD-10-CM

## 2014-09-10 DIAGNOSIS — I481 Persistent atrial fibrillation: Secondary | ICD-10-CM | POA: Diagnosis not present

## 2014-09-10 DIAGNOSIS — I482 Chronic atrial fibrillation, unspecified: Secondary | ICD-10-CM

## 2014-09-10 MED ORDER — DILTIAZEM HCL ER BEADS 180 MG PO CP24: 180.0000 mg | ORAL_CAPSULE | Freq: Every day | ORAL | Status: AC

## 2014-09-10 NOTE — Progress Notes (Signed)
OFFICE NOTE  Chief Complaint:  Occasional lightheadedness with change in position  Primary Care Physician: Emily Grippe, MD  HPI:  Emily Wyatt is a pleasant 81 year old female who is currently referred to me for evaluation of atrial fibrillation. She does have a history of A. fib in the past and was present followed by Dr. Lynnea Wyatt. In 2010 or 2011 she was first diagnosed with A. fib. This is in the setting of small bowel obstruction. She underwent colectomy [facility]. Subsequently she was placed on warfarin and amiodarone for her A. fib. Eventually she was weaned off of the amiodarone and taken off of warfarin. She had no recurrence that we were aware for some time until recently when perioperative evaluation demonstrated abnormal rhythm suggestive of atrial fibrillation. She recently underwent cataract surgery and is planning on another cataract procedure and is here today for preoperative risk assessment. In the office she was noted to be in A. fib with RVR with rates into the 120s. She is completely asymptomatic. She does not have a history of hypertension although this is documented in her charts, she is not on medications as never had blood pressures sustained greater than 130/90. That being said, her only stroke risk factors the fact that she is in her 13s. Her CHADSVASC score is 1. She is currently on no AV nodal blocking agents.  I saw Ms. Emily Wyatt back today in follow-up. She reports some occasional lightheadedness with change in position. Heart rate is still not optimally controlled. She is in A. fib with heart rate of 108. She was started on diltiazem 120 mg daily at her last office visit. I still think he benefit from more medication. There is room with blood pressure. She is contemplating having cataract surgery on her other eye in the near future.  PMHx:  Past Medical History  Diagnosis Date  . Diverticulitis   . Atrial fibrillation   . LGI bleed   . Anticoagulant  long-term use   . Anemia due to blood loss   . Rectal prolapse   . Diverticulosis of colon   . Chronic headaches   . Gallstones   . IBS (irritable bowel syndrome)   . Bowel obstruction   . UTI (lower urinary tract infection)   . Depression   . Anxiety     Past Surgical History  Procedure Laterality Date  . Appendectomy    . Cholescystectomy    . Abdominal hysterectomy    . Small intestine surgery      FAMHx:  Family History  Problem Relation Age of Onset  . Colon cancer Sister   . Heart disease Sister     SOCHx:   reports that she quit smoking about 31 years ago. Her smoking use included Cigarettes. She has a 2.5 pack-year smoking history. She has never used smokeless tobacco. She reports that she does not drink alcohol or use illicit drugs.  ALLERGIES:  Allergies  Allergen Reactions  . Codeine Nausea And Vomiting  . Morphine And Related Nausea And Vomiting  . Promethazine Hcl     UNKNOWN    ROS: A comprehensive review of systems was negative except for: Eyes: positive for cataracts  HOME MEDS: Current Outpatient Prescriptions  Medication Sig Dispense Refill  . albuterol (PROVENTIL HFA;VENTOLIN HFA) 108 (90 BASE) MCG/ACT inhaler Inhale 2 puffs into the lungs every 4 (four) hours as needed for wheezing or shortness of breath. 1 Inhaler 0  . aspirin 325 MG EC tablet Take 325 mg  by mouth daily.     . B Complex Vitamins (VITAMIN B-COMPLEX PO) Take by mouth daily.    Marland Kitchen. BIOTIN PO Take 1,000 mg by mouth daily.     . cholecalciferol (VITAMIN D) 1000 UNITS tablet Take 1,000 Units by mouth daily.    Marland Kitchen. Dextromethorphan-Guaifenesin (MUCINEX DM PO) Take by mouth 2 (two) times daily.    Marland Kitchen. diltiazem (TIAZAC) 180 MG 24 hr capsule Take 1 capsule (180 mg total) by mouth daily. 30 capsule 6  . escitalopram (LEXAPRO) 10 MG tablet Take 15 mg by mouth daily.    . Potassium Gluconate 595 MG CAPS Take 1 capsule by mouth daily.    . Probiotic Product (PROBIOTIC PO) Take by mouth daily.      No current facility-administered medications for this visit.    LABS/IMAGING: No results found for this or any previous visit (from the past 48 hour(s)). No results found.  VITALS: BP 126/76 mmHg  Pulse 108  Ht 5' 4.5" (1.638 m)  Wt 104 lb 14.4 oz (47.582 kg)  BMI 17.73 kg/m2  EXAM: General appearance: alert and no distress Neck: no carotid bruit and no JVD Lungs: clear to auscultation bilaterally Heart: irregularly irregular rhythm Abdomen: soft, non-tender; bowel sounds normal; no masses,  no organomegaly Extremities: extremities normal, atraumatic, no cyanosis or edema Pulses: 2+ and symmetric Skin: Skin color, texture, turgor normal. No rashes or lesions Neurologic: Grossly normal Psych: Pleasant  EKG: Atrial fibrillation with rapid ventricular response at 108  ASSESSMENT: 1. Persistent atrial fibrillation with rapid ventricular response 2. Low risk for cataract surgery 3. Poor anticoagulation candidate due to falls and memory loss 4. CHADSVASC score of 1  PLAN: 1.   Mrs. Wyatt could still stand to have better rate control. I like to increase her Cardizem CD to 180 mg daily. She should continue on aspirin as she is a poor anticoagulation candidate due to ongoing falls and memory loss. I've encouraged her to pursue getting her other cataract worked on as she's had good benefit from her first surgery. Plan is to see her back in a few weeks to see if she's better rate control. I like to get this accomplished before she goes back for her cataract surgery.  Thanks for allowing me to participate in her care.  Emily NoseKenneth Wyatt. Hilty, MD, [facility] Division[facility]ACC Attending Cardiologist [facility]  Wyatt,Emily Wyatt 09/10/2014, 1:45 PM

## 2014-09-10 NOTE — Patient Instructions (Signed)
Your physician has recommended you make the following change in your medication: INCREASE diltiazem to 180mg  daily (a new prescription has been sent to your pharmacy)  Your physician recommends that you schedule a follow-up appointment in 2 weeks (OK to double book if needed)

## 2014-09-26 DIAGNOSIS — I10 Essential (primary) hypertension: Secondary | ICD-10-CM | POA: Diagnosis not present

## 2014-10-02 DIAGNOSIS — E780 Pure hypercholesterolemia: Secondary | ICD-10-CM | POA: Diagnosis not present

## 2014-10-02 DIAGNOSIS — I10 Essential (primary) hypertension: Secondary | ICD-10-CM | POA: Diagnosis not present

## 2014-10-02 DIAGNOSIS — I4891 Unspecified atrial fibrillation: Secondary | ICD-10-CM | POA: Diagnosis not present

## 2014-10-03 ENCOUNTER — Encounter: Payer: Self-pay | Admitting: Internal Medicine

## 2014-10-03 ENCOUNTER — Ambulatory Visit (INDEPENDENT_AMBULATORY_CARE_PROVIDER_SITE_OTHER): Payer: 59 | Admitting: Internal Medicine

## 2014-10-03 VITALS — BP 122/58 | HR 93 | Ht 64.0 in | Wt 106.7 lb

## 2014-10-03 DIAGNOSIS — I482 Chronic atrial fibrillation, unspecified: Secondary | ICD-10-CM

## 2014-10-03 DIAGNOSIS — Z01810 Encounter for preprocedural cardiovascular examination: Secondary | ICD-10-CM

## 2014-10-03 DIAGNOSIS — Z7901 Long term (current) use of anticoagulants: Secondary | ICD-10-CM

## 2014-10-03 DIAGNOSIS — I480 Paroxysmal atrial fibrillation: Secondary | ICD-10-CM | POA: Diagnosis not present

## 2014-10-03 NOTE — Patient Instructions (Signed)
Your physician wants you to follow-up in: 6 months with Dr. Hilty. You will receive a reminder letter in the mail two months in advance. If you don't receive a letter, please call our office to schedule the follow-up appointment.    

## 2014-10-03 NOTE — Progress Notes (Signed)
OFFICE NOTE  Chief Complaint:  No complaints  Primary Care Physician: Pearson GrippeKIM, JAMES, MD  HPI:  Emily Wyatt is a pleasant 81 year old female who is currently referred to me for evaluation of atrial fibrillation. She does have a history of A. fib in the past and was present followed by Dr. Lynnea FerrierSolomon. In 2010 or 2011 she was first diagnosed with A. fib. This is in the setting of small bowel obstruction. She [facility]. Subsequently she was placed on warfarin and amiodarone for her A. fib. Eventually she was weaned off of the amiodarone and taken off of warfarin. She had no recurrence that we were aware for some time until recently when perioperative evaluation demonstrated abnormal rhythm suggestive of atrial fibrillation. She recently underwent cataract surgery and is planning on another cataract procedure and is here today for preoperative risk assessment. In the office she was noted to be in A. fib with RVR with rates into the 120s. She is completely asymptomatic. She does not have a history of hypertension although this is documented in her charts, she is not on medications as never had blood pressures sustained greater than 130/90. That being said, her only stroke risk factors the fact that she is in her 580s. Her CHADSVASC score is 1. She is currently on no AV nodal blocking agents.  I saw Emily Wyatt back today in follow-up. She reports some occasional lightheadedness with change in position. Heart rate is still not optimally controlled. She is in A. fib with heart rate of 108. She was started on diltiazem 120 mg daily at her last office visit. I still think he benefit from more medication. There is room with blood pressure. She is contemplating having cataract surgery on her other eye in the near future.  This Emily Wyatt returns today for follow-up. Heart rate is now better controlled with her A. fib at a rate of 93. She's currently on the Cardizem CD 180 mg dose. At this  point I have no hesitation recommending her to go to cataract surgery. Her daughter did bring in information about the watchman device. We did discuss this is some length and I'm not sure that Emily Wyatt is interested in such an invasive procedure. Although, since she is not a good warfarin candidate, may additionally reduce her risk of stroke. I certainly would be happy to refer her if she's interested.   PMHx:  Past Medical History  Diagnosis Date  . Diverticulitis   . Atrial fibrillation   . LGI bleed   . Anticoagulant long-term use   . Anemia due to blood loss   . Rectal prolapse   . Diverticulosis of colon   . Chronic headaches   . Gallstones   . IBS (irritable bowel syndrome)   . Bowel obstruction   . UTI (lower urinary tract infection)   . Depression   . Anxiety     Past Surgical History  Procedure Laterality Date  . Appendectomy    . Cholescystectomy    . Abdominal hysterectomy    . Small intestine surgery      FAMHx:  Family History  Problem Relation Age of Onset  . Colon cancer Sister   . Heart disease Sister     SOCHx:   reports that she quit smoking about 31 years ago. Her smoking use included Cigarettes. She has a 2.5 pack-year smoking history. She has never used smokeless tobacco. She reports that she does not drink alcohol or use illicit drugs.  ALLERGIES:  Allergies  Allergen Reactions  . Codeine Nausea And Vomiting  . Morphine And Related Nausea And Vomiting  . Promethazine Hcl     UNKNOWN    ROS: A comprehensive review of systems was negative except for: Eyes: positive for cataracts  HOME MEDS: Current Outpatient Prescriptions  Medication Sig Dispense Refill  . albuterol (PROVENTIL HFA;VENTOLIN HFA) 108 (90 BASE) MCG/ACT inhaler Inhale 2 puffs into the lungs every 4 (four) hours as needed for wheezing or shortness of breath. 1 Inhaler 0  . aspirin 325 MG EC tablet Take 325 mg by mouth daily.     . B Complex Vitamins (VITAMIN B-COMPLEX PO)  Take by mouth daily.    Marland Kitchen BIOTIN PO Take 1,000 mg by mouth daily.     . cholecalciferol (VITAMIN D) 1000 UNITS tablet Take 1,000 Units by mouth daily.    Marland Kitchen Dextromethorphan-Guaifenesin (MUCINEX DM PO) Take by mouth 2 (two) times daily.    Marland Kitchen diltiazem (TIAZAC) 180 MG 24 hr capsule Take 1 capsule (180 mg total) by mouth daily. 30 capsule 6  . escitalopram (LEXAPRO) 10 MG tablet Take 15 mg by mouth daily.    . Potassium Gluconate 595 MG CAPS Take 1 capsule by mouth daily.    . Probiotic Product (PROBIOTIC PO) Take by mouth daily.     No current facility-administered medications for this visit.    LABS/IMAGING: No results found for this or any previous visit (from the past 48 hour(s)). No results found.  VITALS: BP 122/58 mmHg  Pulse 93  Ht  (1.626 m)  Wt 106 lb 11.2 oz (48.399 kg)  BMI 18.31 kg/m2  EXAM: deferred  EKG: Atrial fibrillation with controlled ventricular response at 93  ASSESSMENT: 1. Persistent atrial fibrillation with controlled ventricular response 2. Low risk for cataract surgery 3. Poor anticoagulation candidate due to falls and memory loss 4. CHADSVASC score of 1  PLAN: 1.   Emily Wyatt is now rate controlled on higher dose diltiazem. She seems to be tolerating this well and blood pressure is not too low. Her CHADSVASC score is 1 therefore her stroke risk is not particularly high. I did discuss with the family per their request about the watchman atrial appendage occluder device today. I do not think that this will provide her any significant benefit and given her age and comorbidities she may not be a good candidate for the procedure. She also did not seem to be too interested. At this point I think she is low risk for cataract surgery without hesitation. Plan to see her back in 6 months and she should continue on full dose aspirin.  Thanks for allowing me to participate in her care.  Chrystie Nose, MD, [facility] Attending Cardiologist [facility]  Ordean Fouts C 10/03/2014, 8:57 AM

## 2014-10-04 ENCOUNTER — Encounter: Payer: Self-pay | Admitting: Internal Medicine

## 2014-11-08 ENCOUNTER — Other Ambulatory Visit: Payer: Self-pay | Admitting: Internal Medicine

## 2014-11-12 DIAGNOSIS — H25041 Posterior subcapsular polar age-related cataract, right eye: Secondary | ICD-10-CM | POA: Diagnosis not present

## 2014-11-12 DIAGNOSIS — H2511 Age-related nuclear cataract, right eye: Secondary | ICD-10-CM | POA: Diagnosis not present

## 2014-11-12 DIAGNOSIS — H43813 Vitreous degeneration, bilateral: Secondary | ICD-10-CM | POA: Diagnosis not present

## 2014-11-13 DIAGNOSIS — N390 Urinary tract infection, site not specified: Secondary | ICD-10-CM | POA: Diagnosis not present

## 2014-11-29 DIAGNOSIS — H4011X3 Primary open-angle glaucoma, severe stage: Secondary | ICD-10-CM | POA: Diagnosis not present

## 2014-12-12 DIAGNOSIS — H25011 Cortical age-related cataract, right eye: Secondary | ICD-10-CM | POA: Diagnosis not present

## 2014-12-12 DIAGNOSIS — H25811 Combined forms of age-related cataract, right eye: Secondary | ICD-10-CM | POA: Diagnosis not present

## 2014-12-12 DIAGNOSIS — H2511 Age-related nuclear cataract, right eye: Secondary | ICD-10-CM | POA: Diagnosis not present

## 2014-12-12 DIAGNOSIS — H25041 Posterior subcapsular polar age-related cataract, right eye: Secondary | ICD-10-CM | POA: Diagnosis not present

## 2015-02-15 ENCOUNTER — Telehealth: Payer: Self-pay | Admitting: Internal Medicine

## 2015-02-15 ENCOUNTER — Encounter: Payer: Self-pay | Admitting: Internal Medicine

## 2015-02-18 NOTE — Telephone Encounter (Signed)
Close encounter 

## 2015-02-21 DIAGNOSIS — I10 Essential (primary) hypertension: Secondary | ICD-10-CM | POA: Diagnosis not present

## 2015-02-21 DIAGNOSIS — R2681 Unsteadiness on feet: Secondary | ICD-10-CM | POA: Diagnosis not present

## 2015-02-21 DIAGNOSIS — M949 Disorder of cartilage, unspecified: Secondary | ICD-10-CM | POA: Diagnosis not present

## 2015-02-21 DIAGNOSIS — I4891 Unspecified atrial fibrillation: Secondary | ICD-10-CM | POA: Diagnosis not present

## 2015-02-21 DIAGNOSIS — N390 Urinary tract infection, site not specified: Secondary | ICD-10-CM | POA: Diagnosis not present

## 2015-02-21 DIAGNOSIS — R634 Abnormal weight loss: Secondary | ICD-10-CM | POA: Diagnosis not present

## 2015-02-22 ENCOUNTER — Other Ambulatory Visit: Payer: Self-pay | Admitting: Internal Medicine

## 2015-03-04 ENCOUNTER — Encounter: Payer: Self-pay | Admitting: Cardiovascular Disease

## 2015-03-05 ENCOUNTER — Ambulatory Visit: Payer: 59 | Attending: Internal Medicine | Admitting: Physical Therapy

## 2015-03-21 DIAGNOSIS — I10 Essential (primary) hypertension: Secondary | ICD-10-CM | POA: Diagnosis not present

## 2015-03-21 DIAGNOSIS — M899 Disorder of bone, unspecified: Secondary | ICD-10-CM | POA: Diagnosis not present

## 2015-03-21 DIAGNOSIS — R739 Hyperglycemia, unspecified: Secondary | ICD-10-CM | POA: Diagnosis not present

## 2015-03-21 DIAGNOSIS — R2681 Unsteadiness on feet: Secondary | ICD-10-CM | POA: Diagnosis not present

## 2015-03-25 DIAGNOSIS — Z9181 History of falling: Secondary | ICD-10-CM | POA: Diagnosis not present

## 2015-03-25 DIAGNOSIS — R2689 Other abnormalities of gait and mobility: Secondary | ICD-10-CM | POA: Diagnosis not present

## 2015-03-25 DIAGNOSIS — I10 Essential (primary) hypertension: Secondary | ICD-10-CM | POA: Diagnosis not present

## 2015-03-25 DIAGNOSIS — Z8781 Personal history of (healed) traumatic fracture: Secondary | ICD-10-CM | POA: Diagnosis not present

## 2015-03-25 DIAGNOSIS — R531 Weakness: Secondary | ICD-10-CM | POA: Diagnosis not present

## 2015-03-25 DIAGNOSIS — R739 Hyperglycemia, unspecified: Secondary | ICD-10-CM | POA: Diagnosis not present

## 2015-03-25 DIAGNOSIS — Z87891 Personal history of nicotine dependence: Secondary | ICD-10-CM | POA: Diagnosis not present

## 2015-03-25 DIAGNOSIS — M810 Age-related osteoporosis without current pathological fracture: Secondary | ICD-10-CM | POA: Diagnosis not present

## 2015-03-25 DIAGNOSIS — Z87440 Personal history of urinary (tract) infections: Secondary | ICD-10-CM | POA: Diagnosis not present

## 2015-03-25 DIAGNOSIS — R32 Unspecified urinary incontinence: Secondary | ICD-10-CM | POA: Diagnosis not present

## 2015-03-25 DIAGNOSIS — I480 Paroxysmal atrial fibrillation: Secondary | ICD-10-CM | POA: Diagnosis not present

## 2015-03-28 DIAGNOSIS — R32 Unspecified urinary incontinence: Secondary | ICD-10-CM | POA: Diagnosis not present

## 2015-03-28 DIAGNOSIS — R531 Weakness: Secondary | ICD-10-CM | POA: Diagnosis not present

## 2015-03-28 DIAGNOSIS — M810 Age-related osteoporosis without current pathological fracture: Secondary | ICD-10-CM | POA: Diagnosis not present

## 2015-03-28 DIAGNOSIS — R2689 Other abnormalities of gait and mobility: Secondary | ICD-10-CM | POA: Diagnosis not present

## 2015-03-28 DIAGNOSIS — R739 Hyperglycemia, unspecified: Secondary | ICD-10-CM | POA: Diagnosis not present

## 2015-03-28 DIAGNOSIS — Z87440 Personal history of urinary (tract) infections: Secondary | ICD-10-CM | POA: Diagnosis not present

## 2015-03-28 DIAGNOSIS — Z9181 History of falling: Secondary | ICD-10-CM | POA: Diagnosis not present

## 2015-03-28 DIAGNOSIS — I10 Essential (primary) hypertension: Secondary | ICD-10-CM | POA: Diagnosis not present

## 2015-03-28 DIAGNOSIS — Z87891 Personal history of nicotine dependence: Secondary | ICD-10-CM | POA: Diagnosis not present

## 2015-03-28 DIAGNOSIS — Z8781 Personal history of (healed) traumatic fracture: Secondary | ICD-10-CM | POA: Diagnosis not present

## 2015-03-28 DIAGNOSIS — I480 Paroxysmal atrial fibrillation: Secondary | ICD-10-CM | POA: Diagnosis not present

## 2015-04-01 ENCOUNTER — Ambulatory Visit: Payer: 59 | Admitting: Internal Medicine

## 2015-04-01 DIAGNOSIS — Z8781 Personal history of (healed) traumatic fracture: Secondary | ICD-10-CM | POA: Diagnosis not present

## 2015-04-01 DIAGNOSIS — M810 Age-related osteoporosis without current pathological fracture: Secondary | ICD-10-CM | POA: Diagnosis not present

## 2015-04-01 DIAGNOSIS — I480 Paroxysmal atrial fibrillation: Secondary | ICD-10-CM | POA: Diagnosis not present

## 2015-04-01 DIAGNOSIS — I10 Essential (primary) hypertension: Secondary | ICD-10-CM | POA: Diagnosis not present

## 2015-04-01 DIAGNOSIS — R32 Unspecified urinary incontinence: Secondary | ICD-10-CM | POA: Diagnosis not present

## 2015-04-01 DIAGNOSIS — R739 Hyperglycemia, unspecified: Secondary | ICD-10-CM | POA: Diagnosis not present

## 2015-04-01 DIAGNOSIS — R2689 Other abnormalities of gait and mobility: Secondary | ICD-10-CM | POA: Diagnosis not present

## 2015-04-01 DIAGNOSIS — Z87440 Personal history of urinary (tract) infections: Secondary | ICD-10-CM | POA: Diagnosis not present

## 2015-04-01 DIAGNOSIS — R531 Weakness: Secondary | ICD-10-CM | POA: Diagnosis not present

## 2015-04-01 DIAGNOSIS — Z9181 History of falling: Secondary | ICD-10-CM | POA: Diagnosis not present

## 2015-04-01 DIAGNOSIS — Z87891 Personal history of nicotine dependence: Secondary | ICD-10-CM | POA: Diagnosis not present

## 2015-04-03 DIAGNOSIS — Z9181 History of falling: Secondary | ICD-10-CM | POA: Diagnosis not present

## 2015-04-03 DIAGNOSIS — R739 Hyperglycemia, unspecified: Secondary | ICD-10-CM | POA: Diagnosis not present

## 2015-04-03 DIAGNOSIS — R102 Pelvic and perineal pain: Secondary | ICD-10-CM | POA: Diagnosis not present

## 2015-04-03 DIAGNOSIS — M810 Age-related osteoporosis without current pathological fracture: Secondary | ICD-10-CM | POA: Diagnosis not present

## 2015-04-03 DIAGNOSIS — M79652 Pain in left thigh: Secondary | ICD-10-CM | POA: Diagnosis not present

## 2015-04-03 DIAGNOSIS — S79922A Unspecified injury of left thigh, initial encounter: Secondary | ICD-10-CM | POA: Diagnosis not present

## 2015-04-03 DIAGNOSIS — R32 Unspecified urinary incontinence: Secondary | ICD-10-CM | POA: Diagnosis not present

## 2015-04-03 DIAGNOSIS — I10 Essential (primary) hypertension: Secondary | ICD-10-CM | POA: Diagnosis not present

## 2015-04-03 DIAGNOSIS — M25552 Pain in left hip: Secondary | ICD-10-CM | POA: Diagnosis not present

## 2015-04-03 DIAGNOSIS — W19XXXA Unspecified fall, initial encounter: Secondary | ICD-10-CM | POA: Diagnosis not present

## 2015-04-03 DIAGNOSIS — R531 Weakness: Secondary | ICD-10-CM | POA: Diagnosis not present

## 2015-04-03 DIAGNOSIS — Z87440 Personal history of urinary (tract) infections: Secondary | ICD-10-CM | POA: Diagnosis not present

## 2015-04-03 DIAGNOSIS — R2689 Other abnormalities of gait and mobility: Secondary | ICD-10-CM | POA: Diagnosis not present

## 2015-04-03 DIAGNOSIS — I480 Paroxysmal atrial fibrillation: Secondary | ICD-10-CM | POA: Diagnosis not present

## 2015-04-03 DIAGNOSIS — Z8781 Personal history of (healed) traumatic fracture: Secondary | ICD-10-CM | POA: Diagnosis not present

## 2015-04-03 DIAGNOSIS — Z87891 Personal history of nicotine dependence: Secondary | ICD-10-CM | POA: Diagnosis not present

## 2015-04-03 DIAGNOSIS — M898X5 Other specified disorders of bone, thigh: Secondary | ICD-10-CM | POA: Diagnosis not present

## 2015-04-05 ENCOUNTER — Inpatient Hospital Stay (HOSPITAL_COMMUNITY)
Admission: EM | Admit: 2015-04-05 | Discharge: 2015-04-18 | DRG: 329 | Disposition: A | Discharge status: Hospice | Payer: Medicare Other | Attending: Internal Medicine | Admitting: Internal Medicine

## 2015-04-05 ENCOUNTER — Encounter (HOSPITAL_COMMUNITY)
Admission: EM | Disposition: A | Discharge status: Hospice | Payer: Self-pay | Source: Home / Self Care | Attending: Family Medicine

## 2015-04-05 ENCOUNTER — Emergency Department (HOSPITAL_COMMUNITY): Payer: Medicare Other

## 2015-04-05 ENCOUNTER — Encounter (HOSPITAL_COMMUNITY): Payer: Self-pay | Admitting: Emergency Medicine

## 2015-04-05 ENCOUNTER — Inpatient Hospital Stay (HOSPITAL_COMMUNITY): Payer: Medicare Other | Admitting: Anesthesiology

## 2015-04-05 DIAGNOSIS — R52 Pain, unspecified: Secondary | ICD-10-CM

## 2015-04-05 DIAGNOSIS — R4182 Altered mental status, unspecified: Secondary | ICD-10-CM | POA: Insufficient documentation

## 2015-04-05 DIAGNOSIS — D649 Anemia, unspecified: Secondary | ICD-10-CM | POA: Diagnosis present

## 2015-04-05 DIAGNOSIS — F329 Major depressive disorder, single episode, unspecified: Secondary | ICD-10-CM | POA: Diagnosis not present

## 2015-04-05 DIAGNOSIS — J9620 Acute and chronic respiratory failure, unspecified whether with hypoxia or hypercapnia: Secondary | ICD-10-CM | POA: Diagnosis not present

## 2015-04-05 DIAGNOSIS — I482 Chronic atrial fibrillation: Secondary | ICD-10-CM | POA: Diagnosis not present

## 2015-04-05 DIAGNOSIS — J9611 Chronic respiratory failure with hypoxia: Secondary | ICD-10-CM | POA: Diagnosis not present

## 2015-04-05 DIAGNOSIS — Z4682 Encounter for fitting and adjustment of non-vascular catheter: Secondary | ICD-10-CM | POA: Diagnosis not present

## 2015-04-05 DIAGNOSIS — Z800 Family history of malignant neoplasm of digestive organs: Secondary | ICD-10-CM | POA: Diagnosis not present

## 2015-04-05 DIAGNOSIS — J9610 Chronic respiratory failure, unspecified whether with hypoxia or hypercapnia: Secondary | ICD-10-CM | POA: Diagnosis not present

## 2015-04-05 DIAGNOSIS — K567 Ileus, unspecified: Secondary | ICD-10-CM

## 2015-04-05 DIAGNOSIS — Z7901 Long term (current) use of anticoagulants: Secondary | ICD-10-CM

## 2015-04-05 DIAGNOSIS — Z87891 Personal history of nicotine dependence: Secondary | ICD-10-CM | POA: Diagnosis not present

## 2015-04-05 DIAGNOSIS — J45909 Unspecified asthma, uncomplicated: Secondary | ICD-10-CM | POA: Diagnosis not present

## 2015-04-05 DIAGNOSIS — J690 Pneumonitis due to inhalation of food and vomit: Secondary | ICD-10-CM | POA: Diagnosis not present

## 2015-04-05 DIAGNOSIS — K56609 Unspecified intestinal obstruction, unspecified as to partial versus complete obstruction: Secondary | ICD-10-CM

## 2015-04-05 DIAGNOSIS — J449 Chronic obstructive pulmonary disease, unspecified: Secondary | ICD-10-CM | POA: Diagnosis not present

## 2015-04-05 DIAGNOSIS — K9189 Other postprocedural complications and disorders of digestive system: Secondary | ICD-10-CM

## 2015-04-05 DIAGNOSIS — E860 Dehydration: Secondary | ICD-10-CM | POA: Diagnosis present

## 2015-04-05 DIAGNOSIS — K458 Other specified abdominal hernia without obstruction or gangrene: Secondary | ICD-10-CM | POA: Diagnosis not present

## 2015-04-05 DIAGNOSIS — Z681 Body mass index (BMI) 19 or less, adult: Secondary | ICD-10-CM | POA: Diagnosis not present

## 2015-04-05 DIAGNOSIS — E871 Hypo-osmolality and hyponatremia: Secondary | ICD-10-CM | POA: Diagnosis not present

## 2015-04-05 DIAGNOSIS — K460 Unspecified abdominal hernia with obstruction, without gangrene: Secondary | ICD-10-CM | POA: Diagnosis not present

## 2015-04-05 DIAGNOSIS — K5669 Other intestinal obstruction: Secondary | ICD-10-CM | POA: Diagnosis not present

## 2015-04-05 DIAGNOSIS — Z66 Do not resuscitate: Secondary | ICD-10-CM | POA: Diagnosis present

## 2015-04-05 DIAGNOSIS — R1310 Dysphagia, unspecified: Secondary | ICD-10-CM | POA: Diagnosis not present

## 2015-04-05 DIAGNOSIS — J9690 Respiratory failure, unspecified, unspecified whether with hypoxia or hypercapnia: Secondary | ICD-10-CM | POA: Diagnosis not present

## 2015-04-05 DIAGNOSIS — Z8249 Family history of ischemic heart disease and other diseases of the circulatory system: Secondary | ICD-10-CM

## 2015-04-05 DIAGNOSIS — Z79899 Other long term (current) drug therapy: Secondary | ICD-10-CM

## 2015-04-05 DIAGNOSIS — K450 Other specified abdominal hernia with obstruction, without gangrene: Secondary | ICD-10-CM

## 2015-04-05 DIAGNOSIS — I481 Persistent atrial fibrillation: Secondary | ICD-10-CM | POA: Diagnosis not present

## 2015-04-05 DIAGNOSIS — Z823 Family history of stroke: Secondary | ICD-10-CM | POA: Diagnosis not present

## 2015-04-05 DIAGNOSIS — J80 Acute respiratory distress syndrome: Secondary | ICD-10-CM | POA: Diagnosis not present

## 2015-04-05 DIAGNOSIS — M4854XA Collapsed vertebra, not elsewhere classified, thoracic region, initial encounter for fracture: Secondary | ICD-10-CM | POA: Diagnosis not present

## 2015-04-05 DIAGNOSIS — K5660 Unspecified intestinal obstruction: Secondary | ICD-10-CM | POA: Diagnosis not present

## 2015-04-05 DIAGNOSIS — F05 Delirium due to known physiological condition: Secondary | ICD-10-CM | POA: Diagnosis not present

## 2015-04-05 DIAGNOSIS — I4891 Unspecified atrial fibrillation: Secondary | ICD-10-CM | POA: Diagnosis not present

## 2015-04-05 DIAGNOSIS — R410 Disorientation, unspecified: Secondary | ICD-10-CM

## 2015-04-05 DIAGNOSIS — Z4659 Encounter for fitting and adjustment of other gastrointestinal appliance and device: Secondary | ICD-10-CM

## 2015-04-05 DIAGNOSIS — E876 Hypokalemia: Secondary | ICD-10-CM | POA: Diagnosis not present

## 2015-04-05 DIAGNOSIS — G9340 Encephalopathy, unspecified: Secondary | ICD-10-CM | POA: Diagnosis not present

## 2015-04-05 DIAGNOSIS — J9600 Acute respiratory failure, unspecified whether with hypoxia or hypercapnia: Secondary | ICD-10-CM

## 2015-04-05 DIAGNOSIS — Z978 Presence of other specified devices: Secondary | ICD-10-CM

## 2015-04-05 DIAGNOSIS — K913 Postprocedural intestinal obstruction: Secondary | ICD-10-CM | POA: Diagnosis not present

## 2015-04-05 DIAGNOSIS — Z515 Encounter for palliative care: Secondary | ICD-10-CM | POA: Diagnosis not present

## 2015-04-05 DIAGNOSIS — Z452 Encounter for adjustment and management of vascular access device: Secondary | ICD-10-CM | POA: Diagnosis not present

## 2015-04-05 DIAGNOSIS — F419 Anxiety disorder, unspecified: Secondary | ICD-10-CM | POA: Diagnosis present

## 2015-04-05 DIAGNOSIS — W19XXXA Unspecified fall, initial encounter: Secondary | ICD-10-CM | POA: Diagnosis present

## 2015-04-05 DIAGNOSIS — R112 Nausea with vomiting, unspecified: Secondary | ICD-10-CM

## 2015-04-05 DIAGNOSIS — I10 Essential (primary) hypertension: Secondary | ICD-10-CM | POA: Diagnosis present

## 2015-04-05 DIAGNOSIS — I480 Paroxysmal atrial fibrillation: Secondary | ICD-10-CM | POA: Diagnosis not present

## 2015-04-05 DIAGNOSIS — E43 Unspecified severe protein-calorie malnutrition: Secondary | ICD-10-CM | POA: Diagnosis present

## 2015-04-05 DIAGNOSIS — R109 Unspecified abdominal pain: Secondary | ICD-10-CM | POA: Diagnosis not present

## 2015-04-05 DIAGNOSIS — J9601 Acute respiratory failure with hypoxia: Secondary | ICD-10-CM | POA: Diagnosis not present

## 2015-04-05 DIAGNOSIS — K589 Irritable bowel syndrome without diarrhea: Secondary | ICD-10-CM | POA: Diagnosis not present

## 2015-04-05 DIAGNOSIS — Z7982 Long term (current) use of aspirin: Secondary | ICD-10-CM

## 2015-04-05 DIAGNOSIS — Z9889 Other specified postprocedural states: Secondary | ICD-10-CM | POA: Diagnosis not present

## 2015-04-05 HISTORY — PX: LAPAROSCOPY: SHX197

## 2015-04-05 LAB — I-STAT CHEM 8, ED
BUN: 52 mg/dL — AB (ref 6–20)
CREATININE: 0.9 mg/dL (ref 0.44–1.00)
Calcium, Ion: 1.09 mmol/L — ABNORMAL LOW (ref 1.13–1.30)
Chloride: 96 mmol/L — ABNORMAL LOW (ref 101–111)
GLUCOSE: 137 mg/dL — AB (ref 65–99)
HCT: 41 % (ref 36.0–46.0)
HEMOGLOBIN: 13.9 g/dL (ref 12.0–15.0)
POTASSIUM: 4.7 mmol/L (ref 3.5–5.1)
Sodium: 131 mmol/L — ABNORMAL LOW (ref 135–145)
TCO2: 27 mmol/L (ref 0–100)

## 2015-04-05 LAB — CBC
HEMATOCRIT: 36.5 % (ref 36.0–46.0)
Hemoglobin: 12.1 g/dL (ref 12.0–15.0)
MCH: 31.4 pg (ref 26.0–34.0)
MCHC: 33.2 g/dL (ref 30.0–36.0)
MCV: 94.8 fL (ref 78.0–100.0)
PLATELETS: 238 10*3/uL (ref 150–400)
RBC: 3.85 MIL/uL — AB (ref 3.87–5.11)
RDW: 14.6 % (ref 11.5–15.5)
WBC: 8.1 10*3/uL (ref 4.0–10.5)

## 2015-04-05 LAB — COMPREHENSIVE METABOLIC PANEL
ALT: 94 U/L — AB (ref 14–54)
AST: 103 U/L — AB (ref 15–41)
Albumin: 4.4 g/dL (ref 3.5–5.0)
Alkaline Phosphatase: 95 U/L (ref 38–126)
Anion gap: 12 (ref 5–15)
BUN: 39 mg/dL — AB (ref 6–20)
CHLORIDE: 96 mmol/L — AB (ref 101–111)
CO2: 26 mmol/L (ref 22–32)
CREATININE: 0.89 mg/dL (ref 0.44–1.00)
Calcium: 9.6 mg/dL (ref 8.9–10.3)
GFR calc non Af Amer: 57 mL/min — ABNORMAL LOW (ref >60)
Glucose, Bld: 141 mg/dL — ABNORMAL HIGH (ref 65–99)
Potassium: 4.2 mmol/L (ref 3.5–5.1)
SODIUM: 134 mmol/L — AB (ref 135–145)
Total Bilirubin: 1.5 mg/dL — ABNORMAL HIGH (ref 0.3–1.2)
Total Protein: 7.5 g/dL (ref 6.5–8.1)

## 2015-04-05 LAB — BLOOD GAS, ARTERIAL
Acid-base deficit: 0.4 mmol/L (ref 0.0–2.0)
BICARBONATE: 23.2 meq/L (ref 20.0–24.0)
Drawn by: 11249
FIO2: 1
O2 SAT: 99.6 %
PEEP: 5 cmH2O
PH ART: 7.431 (ref 7.350–7.450)
Patient temperature: 98.1
RATE: 14 resp/min
TCO2: 21.5 mmol/L (ref 0–100)
VT: 460 mL
pCO2 arterial: 35.4 mmHg (ref 35.0–45.0)
pO2, Arterial: 411 mmHg — ABNORMAL HIGH (ref 80.0–100.0)

## 2015-04-05 LAB — TROPONIN I: Troponin I: 0.03 ng/mL (ref <0.031)

## 2015-04-05 LAB — TSH: TSH: 0.64 u[IU]/mL (ref 0.350–4.500)

## 2015-04-05 LAB — I-STAT CG4 LACTIC ACID, ED
Lactic Acid, Venous: 1.71 mmol/L (ref 0.5–2.0)
Lactic Acid, Venous: 2.43 mmol/L (ref 0.5–2.0)

## 2015-04-05 SURGERY — LAPAROSCOPY, DIAGNOSTIC
Anesthesia: General | Site: Abdomen

## 2015-04-05 MED ORDER — FENTANYL CITRATE (PF) 100 MCG/2ML IJ SOLN
INTRAMUSCULAR | Status: AC
  Filled 2015-04-05: qty 4

## 2015-04-05 MED ORDER — LACTATED RINGERS IV BOLUS (SEPSIS)
1000.0000 mL | Freq: Once | INTRAVENOUS | Status: AC
Start: 2015-04-05 — End: 2015-04-06
  Administered 2015-04-05: 1000 mL via INTRAVENOUS

## 2015-04-05 MED ORDER — LIDOCAINE HCL 2 % EX GEL
1.0000 "application " | Freq: Once | CUTANEOUS | Status: AC
  Administered 2015-04-05: 1
  Filled 2015-04-05: qty 10

## 2015-04-05 MED ORDER — MIDAZOLAM HCL 2 MG/2ML IJ SOLN: 1.0000 mg | INTRAMUSCULAR | Status: DC | PRN

## 2015-04-05 MED ORDER — PIPERACILLIN-TAZOBACTAM 3.375 G IVPB 30 MIN: 3.3750 g | Freq: Three times a day (TID) | INTRAVENOUS | Status: DC

## 2015-04-05 MED ORDER — PHENYLEPHRINE HCL 10 MG/ML IJ SOLN
INTRAMUSCULAR | Status: AC
  Filled 2015-04-05: qty 2

## 2015-04-05 MED ORDER — SODIUM CHLORIDE 0.9 % IJ SOLN
3.0000 mL | Freq: Two times a day (BID) | INTRAMUSCULAR | Status: DC
  Administered 2015-04-06 – 2015-04-18 (×17): 3 mL via INTRAVENOUS

## 2015-04-05 MED ORDER — MOMETASONE FURO-FORMOTEROL FUM 100-5 MCG/ACT IN AERO
2.0000 | INHALATION_SPRAY | Freq: Two times a day (BID) | RESPIRATORY_TRACT | Status: DC
  Filled 2015-04-05: qty 8.8

## 2015-04-05 MED ORDER — LIDOCAINE HCL (CARDIAC) 20 MG/ML IV SOLN
INTRAVENOUS | Status: DC | PRN
  Administered 2015-04-05: 50 mg via INTRAVENOUS

## 2015-04-05 MED ORDER — ROCURONIUM BROMIDE 100 MG/10ML IV SOLN
INTRAVENOUS | Status: DC | PRN
  Administered 2015-04-05: 10 mg via INTRAVENOUS
  Administered 2015-04-05: 40 mg via INTRAVENOUS
  Administered 2015-04-05: 20 mg via INTRAVENOUS

## 2015-04-05 MED ORDER — DILTIAZEM HCL 25 MG/5ML IV SOLN
5.0000 mg | INTRAVENOUS | Status: DC | PRN
  Administered 2015-04-06: 5 mg via INTRAVENOUS
  Filled 2015-04-05 (×3): qty 5

## 2015-04-05 MED ORDER — NOREPINEPHRINE BITARTRATE 1 MG/ML IV SOLN
0.0000 ug/min | INTRAVENOUS | Status: DC
  Filled 2015-04-05: qty 4

## 2015-04-05 MED ORDER — 0.9 % SODIUM CHLORIDE (POUR BTL) OPTIME
TOPICAL | Status: DC | PRN
  Administered 2015-04-05: 2000 mL

## 2015-04-05 MED ORDER — ONDANSETRON HCL 4 MG/2ML IJ SOLN
4.0000 mg | Freq: Once | INTRAMUSCULAR | Status: AC
  Administered 2015-04-05: 4 mg via INTRAVENOUS
  Filled 2015-04-05: qty 2

## 2015-04-05 MED ORDER — ONDANSETRON HCL 4 MG/2ML IJ SOLN
INTRAMUSCULAR | Status: AC
  Filled 2015-04-05: qty 2

## 2015-04-05 MED ORDER — SODIUM CHLORIDE 0.9 % IV SOLN
Freq: Once | INTRAVENOUS | Status: AC
  Administered 2015-04-05: 15:00:00 via INTRAVENOUS

## 2015-04-05 MED ORDER — PIPERACILLIN-TAZOBACTAM 3.375 G IVPB 30 MIN
3.3750 g | Freq: Once | INTRAVENOUS | Status: AC
  Administered 2015-04-05: 3.375 g via INTRAVENOUS
  Filled 2015-04-05: qty 50

## 2015-04-05 MED ORDER — IOHEXOL 300 MG/ML  SOLN: 50.0000 mL | Freq: Once | INTRAMUSCULAR | Status: DC | PRN

## 2015-04-05 MED ORDER — LACTATED RINGERS IV SOLN
INTRAVENOUS | Status: DC | PRN
  Administered 2015-04-05: 20:00:00 via INTRAVENOUS

## 2015-04-05 MED ORDER — LACTATED RINGERS IV BOLUS (SEPSIS): 1000.0000 mL | Freq: Once | INTRAVENOUS | Status: DC

## 2015-04-05 MED ORDER — SODIUM CHLORIDE 0.9 % IV SOLN
25.0000 ug/h | INTRAVENOUS | Status: DC
  Administered 2015-04-05: 50 ug/h via INTRAVENOUS
  Filled 2015-04-05: qty 50

## 2015-04-05 MED ORDER — FAMOTIDINE IN NACL 20-0.9 MG/50ML-% IV SOLN
20.0000 mg | Freq: Two times a day (BID) | INTRAVENOUS | Status: DC
Start: 2015-04-05 — End: 2015-04-17
  Administered 2015-04-06 – 2015-04-17 (×24): 20 mg via INTRAVENOUS
  Filled 2015-04-05 (×26): qty 50

## 2015-04-05 MED ORDER — ACETAMINOPHEN 325 MG PO TABS: 650.0000 mg | ORAL_TABLET | Freq: Four times a day (QID) | ORAL | Status: DC | PRN

## 2015-04-05 MED ORDER — LACTATED RINGERS IV SOLN: INTRAVENOUS | Status: DC

## 2015-04-05 MED ORDER — SUCCINYLCHOLINE CHLORIDE 20 MG/ML IJ SOLN
INTRAMUSCULAR | Status: DC | PRN
  Administered 2015-04-05: 60 mg via INTRAVENOUS

## 2015-04-05 MED ORDER — ONDANSETRON HCL 4 MG/2ML IJ SOLN
4.0000 mg | Freq: Four times a day (QID) | INTRAMUSCULAR | Status: DC | PRN
  Administered 2015-04-05: 4 mg via INTRAVENOUS

## 2015-04-05 MED ORDER — DEXTROSE 5 % IV SOLN
20.0000 mg | INTRAVENOUS | Status: DC | PRN
Start: 2015-04-05 — End: 2015-04-05
  Administered 2015-04-05: 40 ug/min via INTRAVENOUS

## 2015-04-05 MED ORDER — SODIUM CHLORIDE 0.9 % IV BOLUS (SEPSIS)
500.0000 mL | Freq: Once | INTRAVENOUS | Status: AC
  Administered 2015-04-05: 500 mL via INTRAVENOUS

## 2015-04-05 MED ORDER — FENTANYL BOLUS VIA INFUSION
25.0000 ug | INTRAVENOUS | Status: DC | PRN
  Filled 2015-04-05: qty 25

## 2015-04-05 MED ORDER — SODIUM CHLORIDE 0.9 % IV SOLN
INTRAVENOUS | Status: DC
Start: 2015-04-05 — End: 2015-04-05
  Administered 2015-04-05: 19:00:00 via INTRAVENOUS

## 2015-04-05 MED ORDER — ACETAMINOPHEN 650 MG RE SUPP: 650.0000 mg | Freq: Four times a day (QID) | RECTAL | Status: DC | PRN

## 2015-04-05 MED ORDER — SODIUM CHLORIDE 0.9 % IV SOLN
INTRAVENOUS | Status: DC
  Administered 2015-04-05: 125 mL/h via INTRAVENOUS
  Administered 2015-04-06 – 2015-04-07 (×3): via INTRAVENOUS

## 2015-04-05 MED ORDER — ONDANSETRON HCL 4 MG PO TABS: 4.0000 mg | ORAL_TABLET | Freq: Four times a day (QID) | ORAL | Status: DC | PRN

## 2015-04-05 MED ORDER — FENTANYL CITRATE (PF) 100 MCG/2ML IJ SOLN
50.0000 ug | Freq: Once | INTRAMUSCULAR | Status: AC
  Administered 2015-04-05: 50 ug via INTRAVENOUS

## 2015-04-05 MED ORDER — IOHEXOL 300 MG/ML  SOLN
100.0000 mL | Freq: Once | INTRAMUSCULAR | Status: AC | PRN
  Administered 2015-04-05: 80 mL via INTRAVENOUS

## 2015-04-05 MED ORDER — PIPERACILLIN-TAZOBACTAM 3.375 G IVPB
3.3750 g | Freq: Three times a day (TID) | INTRAVENOUS | Status: DC
  Administered 2015-04-05 – 2015-04-07 (×7): 3.375 g via INTRAVENOUS
  Filled 2015-04-05 (×4): qty 50

## 2015-04-05 MED ORDER — MIDAZOLAM HCL 2 MG/2ML IJ SOLN
4.0000 mg | Freq: Once | INTRAMUSCULAR | Status: AC
  Administered 2015-04-05: 4 mg via INTRAVENOUS

## 2015-04-05 MED ORDER — ROCURONIUM BROMIDE 100 MG/10ML IV SOLN
INTRAVENOUS | Status: AC
Start: 2015-04-05 — End: 2015-04-05
  Filled 2015-04-05: qty 1

## 2015-04-05 MED ORDER — HYDROMORPHONE HCL 1 MG/ML IJ SOLN: 0.5000 mg | INTRAMUSCULAR | Status: DC | PRN

## 2015-04-05 MED ORDER — MIDAZOLAM HCL 2 MG/2ML IJ SOLN
INTRAMUSCULAR | Status: AC
  Filled 2015-04-05: qty 4

## 2015-04-05 MED ORDER — LEVALBUTEROL HCL 0.63 MG/3ML IN NEBU
0.6300 mg | INHALATION_SOLUTION | Freq: Four times a day (QID) | RESPIRATORY_TRACT | Status: DC | PRN
  Administered 2015-04-12: 0.63 mg via RESPIRATORY_TRACT
  Filled 2015-04-05: qty 3

## 2015-04-05 MED ORDER — LACTATED RINGERS IV SOLN
INTRAVENOUS | Status: DC | PRN
  Administered 2015-04-05: 21:00:00 via INTRAVENOUS

## 2015-04-05 MED ORDER — PROPOFOL 10 MG/ML IV BOLUS
INTRAVENOUS | Status: AC
  Filled 2015-04-05: qty 20

## 2015-04-05 MED ORDER — LORAZEPAM 2 MG/ML IJ SOLN
0.5000 mg | Freq: Once | INTRAMUSCULAR | Status: AC
  Administered 2015-04-05: 0.5 mg via INTRAVENOUS
  Filled 2015-04-05: qty 1

## 2015-04-05 MED ORDER — PIPERACILLIN-TAZOBACTAM 3.375 G IVPB
INTRAVENOUS | Status: AC
  Filled 2015-04-05: qty 50

## 2015-04-05 MED ORDER — PROPOFOL 10 MG/ML IV BOLUS
INTRAVENOUS | Status: DC | PRN
  Administered 2015-04-05: 20 mg via INTRAVENOUS
  Administered 2015-04-05: 40 mg via INTRAVENOUS

## 2015-04-05 MED ORDER — LIDOCAINE HCL (CARDIAC) 20 MG/ML IV SOLN
INTRAVENOUS | Status: AC
  Filled 2015-04-05: qty 5

## 2015-04-05 MED ORDER — LACTATED RINGERS IV BOLUS (SEPSIS): 1000.0000 mL | Freq: Three times a day (TID) | INTRAVENOUS | Status: DC | PRN

## 2015-04-05 MED ORDER — FENTANYL CITRATE (PF) 100 MCG/2ML IJ SOLN
INTRAMUSCULAR | Status: DC | PRN
Start: 2015-04-05 — End: 2015-04-05
  Administered 2015-04-05 (×2): 25 ug via INTRAVENOUS
  Administered 2015-04-05: 50 ug via INTRAVENOUS
  Administered 2015-04-05: 25 ug via INTRAVENOUS
  Administered 2015-04-05: 50 ug via INTRAVENOUS
  Administered 2015-04-05: 25 ug via INTRAVENOUS

## 2015-04-05 MED ORDER — SODIUM CHLORIDE 0.9 % IV BOLUS (SEPSIS)
1000.0000 mL | Freq: Once | INTRAVENOUS | Status: AC
  Administered 2015-04-05: 1000 mL via INTRAVENOUS

## 2015-04-05 SURGICAL SUPPLY — 49 items
APL SKNCLS STERI-STRIP NONHPOA (GAUZE/BANDAGES/DRESSINGS)
BENZOIN TINCTURE PRP APPL 2/3 (GAUZE/BANDAGES/DRESSINGS) IMPLANT
CLOSURE WOUND 1/2 X4 (GAUZE/BANDAGES/DRESSINGS)
COUNTER NEEDLE 20 DBL MAG RED (NEEDLE) ×3 IMPLANT
COVER SURGICAL LIGHT HANDLE (MISCELLANEOUS) ×3 IMPLANT
DECANTER SPIKE VIAL GLASS SM (MISCELLANEOUS) IMPLANT
DRAPE LAPAROSCOPIC ABDOMINAL (DRAPES) ×3 IMPLANT
DRAPE WARM FLUID 44X44 (DRAPE) ×3 IMPLANT
ELECT BLADE TIP CTD 4 INCH (ELECTRODE) ×3 IMPLANT
ELECT CAUTERY BLADE 6.4 (BLADE) ×3 IMPLANT
ELECT PENCIL ROCKER SW 15FT (MISCELLANEOUS) ×3 IMPLANT
ELECT REM PT RETURN 9FT ADLT (ELECTROSURGICAL) ×6
ELECTRODE REM PT RTRN 9FT ADLT (ELECTROSURGICAL) ×2 IMPLANT
GAUZE SPONGE 4X4 12PLY STRL (GAUZE/BANDAGES/DRESSINGS) ×3 IMPLANT
GLOVE BIOGEL PI IND STRL 7.0 (GLOVE) ×1 IMPLANT
GLOVE BIOGEL PI INDICATOR 7.0 (GLOVE) ×2
GOWN STRL REUS W/TWL LRG LVL3 (GOWN DISPOSABLE) ×3 IMPLANT
GOWN STRL REUS W/TWL XL LVL3 (GOWN DISPOSABLE) ×6 IMPLANT
GRAFT FLEX HD 6X8 THICK (Tissue Mesh) ×3 IMPLANT
KIT BASIN OR (CUSTOM PROCEDURE TRAY) ×3 IMPLANT
LIQUID BAND (GAUZE/BANDAGES/DRESSINGS) IMPLANT
SET IRRIG TUBING LAPAROSCOPIC (IRRIGATION / IRRIGATOR) IMPLANT
SHEARS HARMONIC ACE PLUS 36CM (ENDOMECHANICALS) ×3 IMPLANT
SOLUTION ANTI FOG 6CC (MISCELLANEOUS) ×3 IMPLANT
SPONGE LAP 18X18 X RAY DECT (DISPOSABLE) ×3 IMPLANT
STAPLER GUN LINEAR PROX 60 (STAPLE) ×3 IMPLANT
STAPLER PROXIMATE 75MM BLUE (STAPLE) ×3 IMPLANT
STAPLER VISISTAT 35W (STAPLE) ×3 IMPLANT
STRIP CLOSURE SKIN 1/2X4 (GAUZE/BANDAGES/DRESSINGS) IMPLANT
SUCTION POOLE TIP (SUCTIONS) ×3 IMPLANT
SUT MON AB 3-0 SH 27 (SUTURE) ×2
SUT MON AB 3-0 SH27 (SUTURE) ×1 IMPLANT
SUT NOVA NAB GS-21 0 18 T12 DT (SUTURE) ×3 IMPLANT
SUT PDS AB 0 CT1 36 (SUTURE) ×6 IMPLANT
SUT SILK 3 0 (SUTURE) ×2
SUT SILK 3 0 SH CR/8 (SUTURE) ×3 IMPLANT
SUT SILK 3-0 18XBRD TIE 12 (SUTURE) ×1 IMPLANT
SUT VIC AB 4-0 PS2 27 (SUTURE) IMPLANT
TAPE CLOTH SURG 4X10 WHT LF (GAUZE/BANDAGES/DRESSINGS) ×3 IMPLANT
TOWEL OR 17X26 10 PK STRL BLUE (TOWEL DISPOSABLE) ×3 IMPLANT
TRAY FOLEY W/METER SILVER 14FR (SET/KITS/TRAYS/PACK) ×3 IMPLANT
TRAY FOLEY W/METER SILVER 16FR (SET/KITS/TRAYS/PACK) IMPLANT
TRAY LAPAROSCOPIC (CUSTOM PROCEDURE TRAY) ×3 IMPLANT
TROCAR BLADELESS OPT 5 75 (ENDOMECHANICALS) IMPLANT
TROCAR XCEL BLUNT TIP 100MML (ENDOMECHANICALS) IMPLANT
TROCAR XCEL NON-BLD 11X100MML (ENDOMECHANICALS) IMPLANT
TROCAR XCEL UNIV SLVE 11M 100M (ENDOMECHANICALS) IMPLANT
TUBING INSUFFLATION 10FT LAP (TUBING) ×3 IMPLANT
YANKAUER SUCT BULB TIP 10FT TU (MISCELLANEOUS) ×3 IMPLANT

## 2015-04-05 NOTE — Anesthesia Postprocedure Evaluation (Signed)
Anesthesia Post Note  Patient: Emily Wyatt  Procedure(s) Performed: Procedure(s) (LRB):  EXPLORATORY LAPAROTOMY, SMALL BOWEL RESECTION, REPAIR OF INCARCERATED HERNIA (N/A)  Anesthesia type: General  Patient location: PACU  Post pain: Pain level controlled  Post assessment: Post-op Vital signs reviewed  Last Vitals:  Filed Vitals:   04/05/15 1830  BP: 134/79  Pulse:   Temp:   Resp: 22    Post vital signs: Reviewed  Level of consciousness: sedated  Complications: No apparent anesthesia complications. To the ICU intubated overnight as a precaution based on preop condition, GA, age and abdominal surgery. BP 156/92 with a SaO2 of 100% upon transfer to controlled ventilation in the ICU.

## 2015-04-05 NOTE — Transfer of Care (Deleted)
Immediate Anesthesia Transfer of Care Note  Patient: Emily Wyatt  Procedure(s) Performed: Procedure(s):  EXPLORATORY LAPAROTOMY, SMALL BOWEL RESECTION, REPAIR OF INCARCERATED HERNIA (N/A)  Patient Location: ICU  Anesthesia Type:General  Level of Consciousness: sedated  Airway & Oxygen Therapy: Patient remains intubated per anesthesia plan and Patient placed on Ventilator (see vital sign flow sheet for setting)  Post-op Assessment: Report given to RN and Post -op Vital signs reviewed and stable  Post vital signs: Reviewed and stable  Last Vitals:  Filed Vitals:   04/05/15 1830  BP: 134/79  Pulse:   Temp:   Resp: 22    Complications: No apparent anesthesia complications

## 2015-04-05 NOTE — H&P (Addendum)
[facility] History and Physical  Emily Wyatt AVW:[id number] DOB: 01/17/1927 DOA: 04/05/2015  Referring physician:   PCP: Pearson Grippe, MD   Chief Complaint: Intractable nausea vomiting HPI:  81 year old female with a history of atrial fibrillation, not an anticoagulation because of life-threatening gastro-intestinal bleeding within the last 2 years, taken off of Coumadin, COPD, who presents to the ER, brought in by EMS for fecal emesis for the last 24 hours. Patient is unable to provide any history and most of the history is obtained from the patient's daughter lives next door. Daughter states that she called her primary care provider office and discussed patient's presentation with the nurse yesterday. PCP office did not feel that this was a condition serious enough to bring her to the ER. Patient also had a mechanical fall last Tuesday. Evaluation by PCP was negative. Patient does have a history of recurrent bowel obstructions and hernia surgeries. She has a history of atrial fibrillation which has been controlled. She sees Dr. Rennis Golden. She was found to have Small bowel obstruction secondary to an incarcerated left obturator hernia and aspiration pneumonia and Dr. Johna Sheriff will operate on her tonight.      Review of Systems: negative for the following  Constitutional: Denies fever, chills, diaphoresis, appetite change and fatigue.  HEENT: Denies photophobia, eye pain, redness, hearing loss, ear pain, congestion, sore throat, rhinorrhea, sneezing, mouth sores, trouble swallowing, neck pain, neck stiffness and tinnitus.  Respiratory: Denies SOB, DOE, cough, chest tightness, and wheezing.  Cardiovascular: Denies chest pain, palpitations and leg swelling.  Gastrointestinal: Positive for nausea, vomiting and abdominal pain. Negative for diarrhea and constipation Genitourinary: Denies dysuria, urgency, frequency, hematuria, flank pain and difficulty urinating.  Musculoskeletal: Denies  myalgias, back pain, joint swelling, arthralgias and gait problem.  Skin: Denies pallor, rash and wound.  Neurological: Denies dizziness, seizures, syncope, weakness, light-headedness, numbness and headaches.  Hematological: Denies adenopathy. Easy bruising, personal or family bleeding history  Psychiatric/Behavioral: Denies suicidal ideation, mood changes, confusion, nervousness, sleep disturbance and agitation       Past Medical History  Diagnosis Date  . Diverticulitis   . Atrial fibrillation   . LGI bleed   . Anticoagulant long-term use   . Anemia due to blood loss   . Rectal prolapse   . Diverticulosis of colon   . Chronic headaches   . Gallstones   . IBS (irritable bowel syndrome)   . Bowel obstruction   . UTI (lower urinary tract infection)   . Depression   . Anxiety      Past Surgical History  Procedure Laterality Date  . Appendectomy    . Cholescystectomy    . Abdominal hysterectomy    . Small intestine surgery        Social History:  reports that she quit smoking about 31 years ago. Her smoking use included Cigarettes. She has a 2.5 pack-year smoking history. She has never used smokeless tobacco. She reports that she does not drink alcohol or use illicit drugs.    Allergies  Allergen Reactions  . Codeine Nausea And Vomiting  . Morphine And Related Nausea And Vomiting  . Promethazine Hcl     hallucinations    Family History  Problem Relation Age of Onset  . Colon cancer Sister   . Heart disease Sister   . Stroke Mother        Prior to Admission medications   Medication Sig Start Date End Date Taking? Authorizing Provider  acetaminophen (TYLENOL) 325 MG tablet Take  650 mg by mouth every 6 (six) hours as needed.   Yes Historical Provider, MD  ADVAIR DISKUS 250-50 MCG/DOSE AEPB INHALE ONE PUFF BY MOUTH EVERY 12 HOURS Patient taking differently: Inhale 2 puffs twice daily 02/22/15  Yes Nyoka Cowden, MD  albuterol (PROVENTIL HFA;VENTOLIN HFA) 108  (90 BASE) MCG/ACT inhaler Inhale 2 puffs into the lungs every 4 (four) hours as needed for wheezing or shortness of breath. 07/30/13  Yes Azalia Bilis, MD  aspirin 81 MG tablet Take 81 mg by mouth daily.   Yes Historical Provider, MD  BIOTIN PO Take 1,000 mg by mouth 2 (two) times daily.    Yes Historical Provider, MD  cholecalciferol (VITAMIN D) 1000 UNITS tablet Take 1,000 Units by mouth daily.   Yes Historical Provider, MD  cyanocobalamin 1000 MCG tablet Take 100 mcg by mouth daily.   Yes Historical Provider, MD  cyclobenzaprine (FLEXERIL) 10 MG tablet Take 5 mg by mouth 3 (three) times daily as needed for muscle spasms.   Yes Historical Provider, MD  diltiazem (TIAZAC) 180 MG 24 hr capsule Take 1 capsule (180 mg total) by mouth daily. 09/10/14  Yes Chrystie Nose, MD  docusate sodium (COLACE) 100 MG capsule Take 100 mg by mouth daily as needed for mild constipation. Stool softenener   Yes Historical Provider, MD  escitalopram (LEXAPRO) 10 MG tablet Take 15 mg by mouth daily.   Yes Historical Provider, MD  Ibuprofen 200 MG CAPS Take 1 capsule by mouth daily as needed.   Yes Historical Provider, MD  naproxen sodium (ANAPROX) 220 MG tablet Take 220 mg by mouth 3 (three) times daily as needed.   Yes Historical Provider, MD  Potassium Gluconate 595 MG CAPS Take 1 capsule by mouth daily.   Yes Historical Provider, MD  Dextromethorphan-Guaifenesin [facility] DM PO) Take by mouth 2 (two) times daily.    Historical Provider, MD     Physical Exam: Filed Vitals:   04/05/15 1103 04/05/15 1234 04/05/15 1430  BP: 143/81  142/82  Pulse: 129  130  Temp: 97.5 F (36.4 C) 99.9 F (37.7 C)   TempSrc: Oral Rectal   Resp:   24  Height: 5\' 5"  (1.651 m)    Weight: 44.906 kg (99 lb)    SpO2: 97%  95%     Constitutional: She appears well-developed and well-nourished. No distress.  Awake, alert, ill appearance Vomiting fecal emesis HENT:  Head: Normocephalic and atraumatic.  Mouth/Throat: Oropharynx is  clear and moist. No oropharyngeal exudate.  Eyes: Conjunctivae are normal. No scleral icterus.  Neck: Normal range of motion. Neck supple.  Cardiovascular: Regular rhythm, normal heart sounds and intact distal pulses. Tachycardia present.  Pulses:  Radial pulses are 2+ on the right side, and 2+ on the left side.   Dorsalis pedis pulses are 2+ on the right side, and 2+ on the left side.  Pulmonary/Chest: Effort normal and breath sounds normal. No respiratory distress. She has no wheezes.  Equal chest expansion  Abdominal: Soft. She exhibits distension. She exhibits no mass. There is tenderness. There is guarding. There is no rebound and no CVA tenderness.  High-pitched bowel sounds heard throughout the abdomen Generalized abdominal tenderness with marked distention and palpable loops of bowel  Musculoskeletal: Normal range of motion. She exhibits no edema.  Neurological: She is alert.  Speech is clear and goal oriented Moves extremities without ataxia  Skin: Skin is warm and dry. She is not diaphoretic. No erythema.  Psychiatric: She has a normal  mood and affect.  Nursing note and vitals reviewed.     Data Review   Micro Results No results found for this or any previous visit (from the past 240 hour(s)).  Radiology Reports Ct Abdomen Pelvis W Contrast  04/05/2015   CLINICAL DATA:  Vomiting for 2 days.  EXAM: CT ABDOMEN AND PELVIS WITH CONTRAST  TECHNIQUE: Multidetector CT imaging of the abdomen and pelvis was performed using the standard protocol following bolus administration of intravenous contrast.  CONTRAST:  80mL OMNIPAQUE IOHEXOL 300 MG/ML  SOLN  COMPARISON:  Abdominal series 04/05/2015.  CT 07/18/2011  FINDINGS: There is patchy bilateral airspace disease in the lower lungs. Findings are suggestive for pneumonia or aspiration. No large pleural effusions. There is enlargement of the heart, particularly the right atrium. Nasogastric tube extends into the proximal stomach  but kinks back and terminates near the GE junction. The examination has limitations due to motion artifact. There is no evidence for free intraperitoneal air.  There is chronic dilatation of the intrahepatic and extrahepatic biliary system and post cholecystectomy. Extrahepatic bile duct measures up to 2.2 cm and similar to the prior examination. Chronic dilatation of the main pancreatic duct measuring 0.5 cm. Portal venous system is patent.  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 No gross abnormality to the adrenal tissue. Probable cyst in left kidney upper pole. No acute abnormality involving the kidneys. Again noted are prominent exophytic cysts involving the right kidney.  Despite having having the nasogastric tube, there continues to be a fluid-filled stomach. Again noted are bowel structures anterior to the gastric antrum.  There are dilated loops of small bowel in the abdomen containing fluid and gas. Small bowel loops measure up to 3.8 cm. Evidence for fluid or edema in the left lower abdomen on sequence 2, image 52. Large amount of fluid in the pelvis probably represents a distended urinary bladder. There is a left obturator hernia containing a loop of bowel with surrounding fluid. This appears to be the cause for the small bowel obstruction. Findings are compatible with an incarcerated hernia. In addition, there may be a small left femoral hernia. Evidence of previous right inguinal hernia repair. Evidence for rectocele along the left side the perineum. Uterus is absent. There are decompressed loops of small bowel in the right abdomen.  Probable large Tarlov cysts in the sacrum. Old compression fracture involving T12 vertebral body. Compression deformity involving the T11 vertebral body is new since 2012. Multilevel disc disease. Grade 1 anterolisthesis at L3-L4.  IMPRESSION: Small bowel obstruction secondary to an incarcerated left obturator hernia. Fluid  surrounding the herniated bowel loop. Small amount of fluid and edema in the left lower abdomen. In addition, there is a small left femoral hernia.  Bilateral airspace disease in the lower lungs compatible with bilateral pneumonia and/or aspiration.  The nasogastric tube is kinked in the proximal stomach and the tip is pointing cephalad near the GE junction. Recommend repositioning of the nasogastric tube.  Chronic biliary dilatation.  Bilateral kidney cysts.  Compression fractures as described. T11 compression fracture is new since 2012.  These results were called by telephone at the time of interpretation on 04/05/2015 at 4:12 pm to [facility] , who verbally acknowledged these results.   Electronically Signed   By: Richarda Overlie M.D.   On: 04/05/2015 16:14   Dg Abd Acute W/chest  04/05/2015   CLINICAL DATA:  Nausea and vomiting for 3-4 days. Recent fall.  Abdominal discomfort and distention.  EXAM: DG ABDOMEN ACUTE W/ 1V CHEST  COMPARISON:  04/03/2015 and 02/21/2015  FINDINGS: New densities in the left lower chest are concerning for airspace disease and pneumonia. Heart size is upper limits normal and stable. Atherosclerotic calcifications at the aortic arch. Scoliosis of the thoracolumbar spine. Dilated gas-filled loops of bowel in the lower abdomen. These appear to represent dilated small bowel loops, largest measuring up to 4.1 cm. No evidence for free air on the decubitus image. Surgical changes in the pelvis.  IMPRESSION: Dilated small bowel loops in the abdomen. Findings are concerning for a small bowel obstruction. No evidence for free air.  Concern for new airspace densities in the left lung which could represent an area of pneumonia or aspiration.   Electronically Signed   By: Richarda Overlie M.D.   On: 04/05/2015 12:31     CBC  Recent Labs Lab 04/05/15 1157 04/05/15 1231  WBC 8.1  --   HGB 12.1 13.9  HCT 36.5 41.0  PLT 238  --   MCV 94.8  --   MCH 31.4  --   MCHC 33.2  --   RDW 14.6  --      Chemistries   Recent Labs Lab 04/05/15 1157 04/05/15 1231  NA 134* 131*  K 4.2 4.7  CL 96* 96*  CO2 26  --   GLUCOSE 141* 137*  BUN 39* 52*  CREATININE 0.89 0.90  CALCIUM 9.6  --   AST 103*  --   ALT 94*  --   ALKPHOS 95  --   BILITOT 1.5*  --    ------------------------------------------------------------------------------------------------------------------ estimated creatinine clearance is 32.4 mL/min (by C-G formula based on Cr of 0.9). ------------------------------------------------------------------------------------------------------------------ No results for input(s): HGBA1C in the last 72 hours. ------------------------------------------------------------------------------------------------------------------ No results for input(s): CHOL, HDL, LDLCALC, TRIG, CHOLHDL, LDLDIRECT in the last 72 hours. ------------------------------------------------------------------------------------------------------------------ No results for input(s): TSH, T4TOTAL, T3FREE, THYROIDAB in the last 72 hours.  Invalid input(s): FREET3 ------------------------------------------------------------------------------------------------------------------ No results for input(s): VITAMINB12, FOLATE, FERRITIN, TIBC, IRON, RETICCTPCT in the last 72 hours.  Coagulation profile No results for input(s): INR, PROTIME in the last 168 hours.  No results for input(s): DDIMER in the last 72 hours.  Cardiac Enzymes No results for input(s): CKMB, TROPONINI, MYOGLOBIN in the last 168 hours.  Invalid input(s): CK ------------------------------------------------------------------------------------------------------------------ Invalid input(s): POCBNP   CBG: No results for input(s): GLUCAP in the last 168 hours.     EKG: Independently reviewed. Atrial fibrillation   Assessment/Plan Principal Problem:   Incarcerated obturator hernia with SBO- based on my assessment, patient's preoperative  risk is moderate to high given history of atrial fibrillation, COPD, age related comorbidities. However given that the patient emergently needs surgery, would proceed with surgery if initial troponin is negative. If Patient's cardiac markers are markedly abnormal, would proceed with caution. EKG did confirm atrial fibrillation. Surgery consulted by EDP, Dr. Johna Sheriff to operate on the patient tonight. Regardless she has a high risk of morbidity and mortality and this was discussed with the patient's daughter.     Atrial fibrillation-off Coumadin because of life-threatening GI bleed according to the daughter, use IV Cardizem for rate control    Chronic asthma-Xopenex when necessary nebulizer treatments, continue Advair    Chronic respiratory failure-will obtain ABG, will need monitoring in the ICU postoperatively, may be difficult extubation postoperatively  Aspiration pneumonia-new infiltrate on cxr , started on  Zosyn, CXR in am    Hyponatremia-likely secondary to dehydration  Code Status:  full Family Communication: bedside Disposition Plan: admit   Total time spent 55 minutes.Greater than 50% of this time was spent in counseling, explanation of diagnosis, planning of further management, and coordination of care  [facility] Pager [phone]  If 7PM-7AM, please contact night-coverage [email or web address] Password [facility] 04/05/2015, 6:03 PM

## 2015-04-05 NOTE — Consult Note (Signed)
PULMONARY / CRITICAL CARE MEDICINE   Name: Emily Wyatt MRN: [id number] DOB: Oct 09, 1926    ADMISSION DATE:  04/05/2015 CONSULTATION DATE:  04/05/2015  REFERRING MD :  Dr. Susie Cassette  CHIEF COMPLAINT:  Nausea vomiting  INITIAL PRESENTATION: ischemic bowel to the OR  SIGNIFICANT EVENTS: Ex lap today   HISTORY OF PRESENT ILLNESS:  81 year old female with a history of atrial fibrillation, not an anticoagulation because of life-threatening gastro-intestinal bleeding within the last 2 years, taken off of Coumadin, COPD, who presents to the ER, brought in by EMS for fecal emesis for the last 24 hours.   Patient does have a history of recurrent bowel obstructions and hernia surgeries.  She presented to the ED and was found to have bowel obstruction and underwent Ex Lap which demonstrated ischemic bowel disease.  PCCM consulted for management of mechanical ventilation.  PAST MEDICAL HISTORY :   has a past medical history of Diverticulitis; Atrial fibrillation; LGI bleed; Anticoagulant long-term use; Anemia due to blood loss; Rectal prolapse; Diverticulosis of colon; Chronic headaches; Gallstones; IBS (irritable bowel syndrome); Bowel obstruction; UTI (lower urinary tract infection); Depression; and Anxiety.  has past surgical history that includes Appendectomy; cholescystectomy; Abdominal hysterectomy; and Small intestine surgery. Prior to Admission medications   Medication Sig Start Date End Date Taking? Authorizing Provider  acetaminophen (TYLENOL) 325 MG tablet Take 650 mg by mouth every 6 (six) hours as needed.   Yes Historical Provider, MD  ADVAIR DISKUS 250-50 MCG/DOSE AEPB INHALE ONE PUFF BY MOUTH EVERY 12 HOURS Patient taking differently: Inhale 2 puffs twice daily 02/22/15  Yes Nyoka Cowden, MD  albuterol (PROVENTIL HFA;VENTOLIN HFA) 108 (90 BASE) MCG/ACT inhaler Inhale 2 puffs into the lungs every 4 (four) hours as needed for wheezing or shortness of breath. 07/30/13  Yes Azalia Bilis, MD   aspirin 81 MG tablet Take 81 mg by mouth daily.   Yes Historical Provider, MD  BIOTIN PO Take 1,000 mg by mouth 2 (two) times daily.    Yes Historical Provider, MD  cholecalciferol (VITAMIN D) 1000 UNITS tablet Take 1,000 Units by mouth daily.   Yes Historical Provider, MD  cyanocobalamin 1000 MCG tablet Take 100 mcg by mouth daily.   Yes Historical Provider, MD  cyclobenzaprine (FLEXERIL) 10 MG tablet Take 5 mg by mouth 3 (three) times daily as needed for muscle spasms.   Yes Historical Provider, MD  diltiazem (TIAZAC) 180 MG 24 hr capsule Take 1 capsule (180 mg total) by mouth daily. 09/10/14  Yes Chrystie Nose, MD  docusate sodium (COLACE) 100 MG capsule Take 100 mg by mouth daily as needed for mild constipation. Stool softenener   Yes Historical Provider, MD  escitalopram (LEXAPRO) 10 MG tablet Take 15 mg by mouth daily.   Yes Historical Provider, MD  Ibuprofen 200 MG CAPS Take 1 capsule by mouth daily as needed.   Yes Historical Provider, MD  naproxen sodium (ANAPROX) 220 MG tablet Take 220 mg by mouth 3 (three) times daily as needed.   Yes Historical Provider, MD  Potassium Gluconate 595 MG CAPS Take 1 capsule by mouth daily.   Yes Historical Provider, MD  Dextromethorphan-Guaifenesin [facility] DM PO) Take by mouth 2 (two) times daily.    Historical Provider, MD   Allergies  Allergen Reactions  . Codeine Nausea And Vomiting  . Morphine And Related Nausea And Vomiting  . Promethazine Hcl     hallucinations    FAMILY HISTORY:  indicated that her mother is alive. She indicated  that her father is alive. She indicated that her sister is deceased. She indicated that only one of her three brothers is alive. She indicated that only one of her two childs is alive.  SOCIAL HISTORY:  reports that she quit smoking about 31 years ago. Her smoking use included Cigarettes. She has a 2.5 pack-year smoking history. She has never used smokeless tobacco. She reports that she does not drink alcohol or use  illicit drugs.  REVIEW OF SYSTEMS:  Patient unable to provide  SUBJECTIVE:   VITAL SIGNS: Temp:  [97.5 F (36.4 C)-99.9 F (37.7 C)] 99.9 F (37.7 C) (09/02 1234) Pulse Rate:  [129-130] 130 (09/02 1430) Resp:  [19-27] 22 (09/02 1830) BP: (129-155)/(75-91) 134/79 mmHg (09/02 1830) SpO2:  [95 %-97 %] 95 % (09/02 1430) FiO2 (%):  [100 %] 100 % (09/02 2236) Weight:  [44.906 kg (99 lb)] 44.906 kg (99 lb) (09/02 1103) HEMODYNAMICS:   VENTILATOR SETTINGS: Vent Mode:  [-] PRVC FiO2 (%):  [100 %] 100 % Set Rate:  [14 bmp] 14 bmp Vt Set:  [480 mL] 480 mL PEEP:  [5 cmH20] 5 cmH20 Plateau Pressure:  [14 cmH20] 14 cmH20 INTAKE / OUTPUT:  Intake/Output Summary (Last 24 hours) at 04/05/15 2308 Last data filed at 04/05/15 2232  Gross per 24 hour  Intake   1700 ml  Output    100 ml  Net   1600 ml    PHYSICAL EXAMINATION: General:  Intubated and sedated/paralyzed Neuro:  Unable to assess HEENT:   ETT in place   Cardiovascular:  Irreg Rate/rhythm Lungs:  CTA b/l Abdomen:  Soft, dressing in place. Normal bowel sounds   LABS:  CBC  Recent Labs Lab 04/05/15 1157 04/05/15 1231  WBC 8.1  --   HGB 12.1 13.9  HCT 36.5 41.0  PLT 238  --    Coag's No results for input(s): APTT, INR in the last 168 hours. BMET  Recent Labs Lab 04/05/15 1157 04/05/15 1231  NA 134* 131*  K 4.2 4.7  CL 96* 96*  CO2 26  --   BUN 39* 52*  CREATININE 0.89 0.90  GLUCOSE 141* 137*   Electrolytes  Recent Labs Lab 04/05/15 1157  CALCIUM 9.6   Sepsis Markers  Recent Labs Lab 04/05/15 1210 04/05/15 1646  LATICACIDVEN 1.71 2.43*   ABG No results for input(s): PHART, PCO2ART, PO2ART in the last 168 hours. Liver Enzymes  Recent Labs Lab 04/05/15 1157  AST 103*  ALT 94*  ALKPHOS 95  BILITOT 1.5*  ALBUMIN 4.4   Cardiac Enzymes  Recent Labs Lab 04/05/15 1824  TROPONINI 0.03   Glucose No results for input(s): GLUCAP in the last 168 hours.  Imaging Ct Abdomen Pelvis W  Contrast  04/05/2015   CLINICAL DATA:  Vomiting for 2 days.  EXAM: CT ABDOMEN AND PELVIS WITH CONTRAST  TECHNIQUE: Multidetector CT imaging of the abdomen and pelvis was performed using the standard protocol following bolus administration of intravenous contrast.  CONTRAST:  80mL OMNIPAQUE IOHEXOL 300 MG/ML  SOLN  COMPARISON:  Abdominal series 04/05/2015.  CT 07/18/2011  FINDINGS: There is patchy bilateral airspace disease in the lower lungs. Findings are suggestive for pneumonia or aspiration. No large pleural effusions. There is enlargement of the heart, particularly the right atrium. Nasogastric tube extends into the proximal stomach but kinks back and terminates near the GE junction. The examination has limitations due to motion artifact. There is no evidence for free intraperitoneal air.  There is chronic dilatation of  the intrahepatic and extrahepatic biliary system and post cholecystectomy. Extrahepatic bile duct measures up to 2.2 cm and similar to the prior examination. Chronic dilatation of the main pancreatic duct measuring 0.5 cm. Portal venous system is patent.  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 No gross abnormality to the adrenal tissue. Probable cyst in left kidney upper pole. No acute abnormality involving the kidneys. Again noted are prominent exophytic cysts involving the right kidney.  Despite having having the nasogastric tube, there continues to be a fluid-filled stomach. Again noted are bowel structures anterior to the gastric antrum.  There are dilated loops of small bowel in the abdomen containing fluid and gas. Small bowel loops measure up to 3.8 cm. Evidence for fluid or edema in the left lower abdomen on sequence 2, image 52. Large amount of fluid in the pelvis probably represents a distended urinary bladder. There is a left obturator hernia containing a loop of bowel with surrounding fluid. This appears to be the cause for the small  bowel obstruction. Findings are compatible with an incarcerated hernia. In addition, there may be a small left femoral hernia. Evidence of previous right inguinal hernia repair. Evidence for rectocele along the left side the perineum. Uterus is absent. There are decompressed loops of small bowel in the right abdomen.  Probable large Tarlov cysts in the sacrum. Old compression fracture involving T12 vertebral body. Compression deformity involving the T11 vertebral body is new since 2012. Multilevel disc disease. Grade 1 anterolisthesis at L3-L4.  IMPRESSION: Small bowel obstruction secondary to an incarcerated left obturator hernia. Fluid surrounding the herniated bowel loop. Small amount of fluid and edema in the left lower abdomen. In addition, there is a small left femoral hernia.  Bilateral airspace disease in the lower lungs compatible with bilateral pneumonia and/or aspiration.  The nasogastric tube is kinked in the proximal stomach and the tip is pointing cephalad near the GE junction. Recommend repositioning of the nasogastric tube.  Chronic biliary dilatation.  Bilateral kidney cysts.  Compression fractures as described. T11 compression fracture is new since 2012.  These results were called by telephone at the time of interpretation on 04/05/2015 at 4:12 pm to [facility] , who verbally acknowledged these results.   Electronically Signed   By: Richarda Overlie M.D.   On: 04/05/2015 16:14   Dg Abd Acute W/chest  04/05/2015   CLINICAL DATA:  Nausea and vomiting for 3-4 days. Recent fall. Abdominal discomfort and distention.  EXAM: DG ABDOMEN ACUTE W/ 1V CHEST  COMPARISON:  04/03/2015 and 02/21/2015  FINDINGS: New densities in the left lower chest are concerning for airspace disease and pneumonia. Heart size is upper limits normal and stable. Atherosclerotic calcifications at the aortic arch. Scoliosis of the thoracolumbar spine. Dilated gas-filled loops of bowel in the lower abdomen. These appear to  represent dilated small bowel loops, largest measuring up to 4.1 cm. No evidence for free air on the decubitus image. Surgical changes in the pelvis.  IMPRESSION: Dilated small bowel loops in the abdomen. Findings are concerning for a small bowel obstruction. No evidence for free air.  Concern for new airspace densities in the left lung which could represent an area of pneumonia or aspiration.   Electronically Signed   By: Richarda Overlie M.D.   On: 04/05/2015 12:31     ASSESSMENT / PLAN:  PULMONARY  A: Post-operative maintenance of mechanical ventilation. P:    - Vent: 8cc/kg, RR 16,  FiO2 40, PEEP 5  - Intermittent sedation with versed/fentanyl  - ABG now  - Daily oral care  - Famotidine 20mg  BID  -HOB @ 30degrees  - CXR  FAMILY  - Updates: updated today at the bedside   Total critical care time: 30 min  Critical care time was exclusive of separately billable procedures and treating other patients.  Critical care was necessary to treat or prevent imminent or life-threatening deterioration.  Critical care was time spent personally by me on the following activities: development of treatment plan with patient and/or surrogate as well as nursing, discussions with consultants, evaluation of patient's response to treatment, examination of patient, obtaining history from patient or surrogate, ordering and performing treatments and interventions, ordering and review of laboratory studies, ordering and review of radiographic studies, pulse oximetry and re-evaluation of patient's condition.   Galvin Proffer, DO., MS [facility]      Pulmonary and Critical Care Medicine [facility] Pager: [phone]  04/05/2015, 11:08 PM

## 2015-04-05 NOTE — ED Notes (Signed)
Dr. Michaell Cowing wants patient to go OR. [facility] on 2w was called and notified that patient is going to or first.

## 2015-04-05 NOTE — Progress Notes (Signed)
ANTIBIOTIC CONSULT NOTE - INITIAL  Pharmacy Consult for Zosyn Indication: Aspiration pneumonia  Allergies  Allergen Reactions  . Codeine Nausea And Vomiting  . Morphine And Related Nausea And Vomiting  . Promethazine Hcl     hallucinations    Patient Measurements: Height:  (165.1 cm) Weight: 99 lb (44.906 kg) IBW/kg (Calculated) : 57  Vital Signs: Temp: 99.9 F (37.7 C) (09/02 1234) Temp Source: Rectal (09/02 1234) BP: 142/82 mmHg (09/02 1430) Pulse Rate: 130 (09/02 1430) Intake/Output from previous day:    Labs:  Recent Labs  04/05/15 1157 04/05/15 1231  WBC 8.1  --   HGB 12.1 13.9  PLT 238  --   CREATININE 0.89 0.90   Estimated Creatinine Clearance: 32.4 mL/min (by C-G formula based on Cr of 0.9). No results for input(s): VANCOTROUGH, VANCOPEAK, VANCORANDOM, GENTTROUGH, GENTPEAK, GENTRANDOM, TOBRATROUGH, TOBRAPEAK, TOBRARND, AMIKACINPEAK, AMIKACINTROU, AMIKACIN in the last 72 hours.   Microbiology: No results found for this or any previous visit (from the past 720 hour(s)).  Medical History: Past Medical History  Diagnosis Date  . Diverticulitis   . Atrial fibrillation   . LGI bleed   . Anticoagulant long-term use   . Anemia due to blood loss   . Rectal prolapse   . Diverticulosis of colon   . Chronic headaches   . Gallstones   . IBS (irritable bowel syndrome)   . Bowel obstruction   . UTI (lower urinary tract infection)   . Depression   . Anxiety     Assessment: 45 yoF presents to ED on 9/2 with intractable N/V.  PMH includes afib, COPD, recurrent bowel obstruction and hernia surgery.  CT (9/2) shows SBO from incarcerated hernia, NG tube placed, surgery is consulted.  CT also shows airspace dz c/w bilateral pneumonia and/or aspiration.  Pharmacy is consulted to dose Zosyn.  9/2 >> Zosyn >>  Today, 04/05/2015:  Tm 99.9  WBC 8.1  SCr 0.9 with CrCl ~ 32 ml/min   Goal of Therapy:  Appropriate abx dosing, eradication of infection.    Plan:   Zosyn 3.375g IV Q8H infused over 4hrs.   Follow up renal fxn, culture results, and clinical course.  Lynann Beaver PharmD, BCPS Pager [phone] 04/05/2015 6:07 PM

## 2015-04-05 NOTE — Anesthesia Preprocedure Evaluation (Addendum)
Anesthesia Evaluation  Patient identified by MRN, date of birth, ID band Patient confused    Reviewed: Allergy & Precautions, NPO status , Patient's Chart, lab work & pertinent test results  Airway Mallampati: III       Dental  (+) Poor Dentition   Pulmonary former smoker,   Struggling to breathe Pulmonary exam normal       Cardiovascular + dysrhythmias Atrial Fibrillation Rhythm:Regular Rate:Tachycardia     Neuro/Psych PSYCHIATRIC DISORDERS Anxiety Depression    GI/Hepatic negative GI ROS, Neg liver ROS,   Endo/Other  negative endocrine ROS  Renal/GU negative Renal ROS  negative genitourinary   Musculoskeletal negative musculoskeletal ROS (+)   Abdominal (+) + scaphoid   Peds negative pediatric ROS (+)  Hematology   Anesthesia Other Findings   Reproductive/Obstetrics negative OB ROS                           Lab Results  Component Value Date   WBC 8.1 04/05/2015   HGB 13.9 04/05/2015   HCT 41.0 04/05/2015   MCV 94.8 04/05/2015   PLT 238 04/05/2015   Lab Results  Component Value Date   CREATININE 0.90 04/05/2015   BUN 52* 04/05/2015   NA 131* 04/05/2015   K 4.7 04/05/2015   CL 96* 04/05/2015   CO2 26 04/05/2015   Lab Results  Component Value Date   INR 1.18 07/23/2011   INR 1.17 07/22/2011   INR 1.15 07/21/2011   EKG: atrial fibrillation, rate 90's.   Anesthesia Physical Anesthesia Plan  ASA: III and emergent  Anesthesia Plan: General   Post-op Pain Management:    Induction: Intravenous  Airway Management Planned: Oral ETT  Additional Equipment:   Intra-op Plan:   Post-operative Plan: Possible Post-op intubation/ventilation  Informed Consent: I have reviewed the patients History and Physical, chart, labs and discussed the procedure including the risks, benefits and alternatives for the proposed anesthesia with the patient or authorized representative who has  indicated his/her understanding and acceptance.   Dental advisory given  Plan Discussed with: CRNA and Surgeon  Anesthesia Plan Comments:       Anesthesia Quick Evaluation

## 2015-04-05 NOTE — Anesthesia Procedure Notes (Signed)
Procedure Name: Intubation Date/Time: 04/05/2015 8:31 PM Performed by: Paris Lore Pre-anesthesia Checklist: Patient identified, Emergency Drugs available, Suction available, Patient being monitored and Timeout performed Patient Re-evaluated:Patient Re-evaluated prior to inductionOxygen Delivery Method: Circle system utilized Preoxygenation: Pre-oxygenation with 100% oxygen ( remained on at 2lpm during induction and intubation in combination with 100% pre-O2) Intubation Type: IV induction, Rapid sequence and Cricoid Pressure applied Ventilation: Mask ventilation without difficulty Laryngoscope Size: Mac and 4 Grade View: Grade I Tube type: Oral Tube size: 7.5 mm Number of attempts: 1 Airway Equipment and Method: Stylet Placement Confirmation: ETT inserted through vocal cords under direct vision,  positive ETCO2,  CO2 detector and breath sounds checked- equal and bilateral Secured at: 21 cm Tube secured with: Tape Dental Injury: Teeth and Oropharynx as per pre-operative assessment

## 2015-04-05 NOTE — Transfer of Care (Signed)
Immediate Anesthesia Transfer of Care Note  Patient: Emily Wyatt  Procedure(s) Performed: Procedure(s):  EXPLORATORY LAPAROTOMY, SMALL BOWEL RESECTION, REPAIR OF INCARCERATED HERNIA (N/A)  Patient Location: PACU  Anesthesia Type:General  Level of Consciousness: unresponsive  Airway & Oxygen Therapy: Patient remains intubated per anesthesia plan and Patient placed on Ventilator (see vital sign flow sheet for setting)  Post-op Assessment: Report given to RN and Post -op Vital signs reviewed and stable  Post vital signs: Reviewed and stable  Last Vitals:  Filed Vitals:   04/05/15 1830  BP: 134/79  Pulse:   Temp:   Resp: 22    Complications: No apparent anesthesia complications

## 2015-04-05 NOTE — Consult Note (Signed)
[facility]  [address] [place], [place] [address] Phone: [phone] FAX: Home  10-Apr-1927 [id number]  CARE TEAM:  PCP: Jani Gravel, MD  Outpatient Care Team: Patient Care Team: Jani Gravel, MD as PCP - General (Internal Medicine)  Inpatient Treatment Team: Treatment Team: Attending Provider: Reyne Dumas, MD; Registered Nurse: Ival Bible, RN; Registered Nurse: Markus Daft, RN; Consulting Physician: Nolon Nations, MD; Rounding Team: Jackelyn Knife, MD; Technician: Delight Hoh, NT  This patient is a 81 y.o.female who presents today for surgical evaluation at the request of Georgeanna Lea, [place] ED.   Reason for evaluation: Small bowel obstruction with pain.  81 year old elderly woman.  Lives by herself.  Family involved.  Relatively independent.  Apparently fell down a few days ago.  Saw her primary care physician.  Workup negative for fracture or head bleed.  Then began to have crampy pain and left thigh pain.  Abdominal pain worsened.  Nausea and vomiting.  Had a horrible night.  Can emergency room today.  Has not been able take her heart medicines.  Heart rate getting higher.  [facility] emergency room.  Concern for bowel obstruction.  CT scan concern for incarcerated obturator hernia.  Surgical consultation requested.  I saw the patient once I came out of emergency surgery.  Extended family bedside.  Patient confused with nasogastric tube in place.  Feculent drainage.  Moderately distended.  Some discomfort but would not say frank peritonitis.  Seen by hospitalist.  Considered moderate high risk.  Family feels patient had good independent quality of life and would wish to be aggressive.  She has had prior abdominal surgeries.  Had rectal prolapse.  Underwent laparoscopically assisted sigmoid colectomy and rectopexy in 2010.  Laparoscopic cholecystectomy in 2002.  Past Medical History  Diagnosis Date  .  Diverticulitis   . Atrial fibrillation   . LGI bleed   . Anticoagulant long-term use   . Anemia due to blood loss   . Rectal prolapse   . Diverticulosis of colon   . Chronic headaches   . Gallstones   . IBS (irritable bowel syndrome)   . Bowel obstruction   . UTI (lower urinary tract infection)   . Depression   . Anxiety     Past Surgical History  Procedure Laterality Date  . Appendectomy    . Cholescystectomy    . Abdominal hysterectomy    . Small intestine surgery      Social History   Social History  . Marital Status: Widowed    Spouse Name: N/A  . Number of Children: 4  . Years of Education: N/A   Occupational History  . housewife    Social History Main Topics  . Smoking status: Former Smoker -- 0.50 packs/day for 5 years    Types: Cigarettes    Quit date: 08/04/1983  . Smokeless tobacco: Never Used  . Alcohol Use: No  . Drug Use: No  . Sexual Activity:    Partners: Male   Other Topics Concern  . Not on file   Social History Narrative    Family History  Problem Relation Age of Onset  . Colon cancer Sister   . Heart disease Sister   . Stroke Mother     Current Facility-Administered Medications  Medication Dose Route Frequency Provider Last Rate Last Dose  . 0.9 %  sodium chloride infusion   Intravenous Continuous Reyne Dumas, MD 150  mL/hr at 04/05/15 1851    . acetaminophen (TYLENOL) suppository 650 mg  650 mg Rectal Q6H PRN Michael Boston, MD      . acetaminophen (TYLENOL) tablet 650 mg  650 mg Oral Q6H PRN Reyne Dumas, MD       Or  . acetaminophen (TYLENOL) suppository 650 mg  650 mg Rectal Q6H PRN Reyne Dumas, MD      . diltiazem (CARDIZEM) injection 5 mg  5 mg Intravenous Q4H PRN Reyne Dumas, MD      . HYDROmorphone (DILAUDID) injection 0.5 mg  0.5 mg Intravenous Q4H PRN Reyne Dumas, MD      . lactated ringers bolus 1,000 mL  1,000 mL Intravenous Q8H PRN Michael Boston, MD      . lactated ringers bolus 1,000 mL  1,000 mL Intravenous Once  Michael Boston, MD      . levalbuterol Penne Lash) nebulizer solution 0.63 mg  0.63 mg Nebulization Q6H PRN Reyne Dumas, MD      . ondansetron (ZOFRAN) tablet 4 mg  4 mg Oral Q6H PRN Reyne Dumas, MD       Or  . ondansetron (ZOFRAN) injection 4 mg  4 mg Intravenous Q6H PRN Reyne Dumas, MD      . Derrill Memo ON 04/06/2015] piperacillin-tazobactam (ZOSYN) IVPB 3.375 g  3.375 g Intravenous Q8H Christine E Shade, RPH      . sodium chloride 0.9 % injection 3 mL  3 mL Intravenous Q12H Reyne Dumas, MD       Current Outpatient Prescriptions  Medication Sig Dispense Refill  . acetaminophen (TYLENOL) 325 MG tablet Take 650 mg by mouth every 6 (six) hours as needed.    Marland Kitchen ADVAIR DISKUS 250-50 MCG/DOSE AEPB INHALE ONE PUFF BY MOUTH EVERY 12 HOURS (Patient taking differently: Inhale 2 puffs twice daily) 60 each 0  . albuterol (PROVENTIL HFA;VENTOLIN HFA) 108 (90 BASE) MCG/ACT inhaler Inhale 2 puffs into the lungs every 4 (four) hours as needed for wheezing or shortness of breath. 1 Inhaler 0  . aspirin 81 MG tablet Take 81 mg by mouth daily.    Marland Kitchen BIOTIN PO Take 1,000 mg by mouth 2 (two) times daily.     . cholecalciferol (VITAMIN D) 1000 UNITS tablet Take 1,000 Units by mouth daily.    . cyanocobalamin 1000 MCG tablet Take 100 mcg by mouth daily.    . cyclobenzaprine (FLEXERIL) 10 MG tablet Take 5 mg by mouth 3 (three) times daily as needed for muscle spasms.    Marland Kitchen diltiazem (TIAZAC) 180 MG 24 hr capsule Take 1 capsule (180 mg total) by mouth daily. 30 capsule 6  . docusate sodium (COLACE) 100 MG capsule Take 100 mg by mouth daily as needed for mild constipation. Stool softenener    . escitalopram (LEXAPRO) 10 MG tablet Take 15 mg by mouth daily.    . Ibuprofen 200 MG CAPS Take 1 capsule by mouth daily as needed.    . naproxen sodium (ANAPROX) 220 MG tablet Take 220 mg by mouth 3 (three) times daily as needed.    . Potassium Gluconate 595 MG CAPS Take 1 capsule by mouth daily.    Marland Kitchen Dextromethorphan-Guaifenesin  (MUCINEX DM PO) Take by mouth 2 (two) times daily.       Allergies  Allergen Reactions  . Codeine Nausea And Vomiting  . Morphine And Related Nausea And Vomiting  . Promethazine Hcl     hallucinations    ROS: Constitutional:  No fevers, chills, sweats.  Weight stable Eyes:  No vision changes, No discharge HENT:  No sore throats, nasal drainage Lymph: No neck swelling, No bruising easily Pulmonary:  No cough, productive sputum CV: No orthopnea, PND  Patient walks 20 minutes without difficulty.  No exertional chest/neck/shoulder/arm pain. GI: No personal nor family history of GI/colon cancer, inflammatory bowel disease, irritable bowel syndrome, allergy such as Celiac Sprue, dietary/dairy problems, colitis, ulcers nor gastritis.  No recent sick contacts/gastroenteritis.  No travel outside the country.  No changes in diet. Renal: No UTIs, No hematuria Genital:  No drainage, bleeding, masses Musculoskeletal: No severe joint pain.  Good ROM major joints Skin:  No sores or lesions.  No rashes Heme/Lymph:  No easy bleeding.  No swollen lymph nodes Neuro: No focal weakness/numbness.  No seizures Psych: No suicidal ideation.  No hallucinations  BP 134/79 mmHg  Pulse 130  Temp(Src) 99.9 F (37.7 C) (Rectal)  Resp 22  Ht 5\' 5"  (1.651 m)  Wt 44.906 kg (99 lb)  BMI 16.47 kg/m2  SpO2 95%  Physical Exam: General: Pt confused.  Wincing sitting.  Localizes to pain.  Grunting.   Eyes: PERRL, normal EOM. Sclera nonicteric Neuro: CN II-XII intact w/o focal sensory/motor deficits. Lymph: No head/neck/groin lymphadenopathy Psych:  No delerium/psychosis/paranoia HENT: Normocephalic, Mucus membranes moist.  No thrush.  NG tube in place.  Flushes easily.  Feculent thick brown green output.  Already 1500 mL out. Neck: Supple, No tracheal deviation Chest: No pain.  Good respiratory excursion. CV:  Pulses intact.  Tachycardic.  Somewhat irregular  rhythm Abdomen: Moderately distended.  Thin  abdominal wall.  Old midline fascial sutures felt.  Discomfort but no frank peritonitis or guarding.  Not feel any inguinal or femoral hernias.  Some discomfort on left perineal pressure.  Cannot feel definite perineal/obturator mass.  . Ext:  No significant edema.  No cyanosis Skin: No petechiae / purpurea.  No major sores.  Some old bruising. Musculoskeletal: No severe joint pain.  Good ROM major joints   Results:   Labs: Results for orders placed or performed during the hospital encounter of 04/05/15 (from the past 48 hour(s))  Comprehensive metabolic panel     Status: Abnormal   Collection Time: 04/05/15 11:57 AM  Result Value Ref Range   Sodium 134 (L) 135 - 145 mmol/L   Potassium 4.2 3.5 - 5.1 mmol/L   Chloride 96 (L) 101 - 111 mmol/L   CO2 26 22 - 32 mmol/L   Glucose, Bld 141 (H) 65 - 99 mg/dL   BUN 39 (H) 6 - 20 mg/dL   Creatinine, Ser 06/05/15 0.44 - 1.00 mg/dL   Calcium 9.6 8.9 - 4.24 mg/dL   Total Protein 7.5 6.5 - 8.1 g/dL   Albumin 4.4 3.5 - 5.0 g/dL   AST 24.4 (H) 15 - 41 U/L   ALT 94 (H) 14 - 54 U/L   Alkaline Phosphatase 95 38 - 126 U/L   Total Bilirubin 1.5 (H) 0.3 - 1.2 mg/dL   GFR calc non Af Amer 57 (L) >60 mL/min   GFR calc Af Amer >60 >60 mL/min    Comment: (NOTE) The eGFR has been calculated using the CKD EPI equation. This calculation has not been validated in all clinical situations. eGFR's persistently <60 mL/min signify possible Chronic Kidney Disease.    Anion gap 12 5 - 15  CBC     Status: Abnormal   Collection Time: 04/05/15 11:57 AM  Result Value Ref Range   WBC 8.1 4.0 - 10.5 K/uL  RBC 3.85 (L) 3.87 - 5.11 MIL/uL   Hemoglobin 12.1 12.0 - 15.0 g/dL   HCT 36.5 36.0 - 46.0 %   MCV 94.8 78.0 - 100.0 fL   MCH 31.4 26.0 - 34.0 pg   MCHC 33.2 30.0 - 36.0 g/dL   RDW 14.6 11.5 - 15.5 %   Platelets 238 150 - 400 K/uL  I-Stat CG4 Lactic Acid, ED     Status: None   Collection Time: 04/05/15 12:10 PM  Result Value Ref Range   Lactic Acid, Venous 1.71  0.5 - 2.0 mmol/L  I-stat chem 8, ed     Status: Abnormal   Collection Time: 04/05/15 12:31 PM  Result Value Ref Range   Sodium 131 (L) 135 - 145 mmol/L   Potassium 4.7 3.5 - 5.1 mmol/L   Chloride 96 (L) 101 - 111 mmol/L   BUN 52 (H) 6 - 20 mg/dL   Creatinine, Ser 0.90 0.44 - 1.00 mg/dL   Glucose, Bld 137 (H) 65 - 99 mg/dL   Calcium, Ion 1.09 (L) 1.13 - 1.30 mmol/L   TCO2 27 0 - 100 mmol/L   Hemoglobin 13.9 12.0 - 15.0 g/dL   HCT 41.0 36.0 - 46.0 %  I-Stat CG4 Lactic Acid, ED     Status: Abnormal   Collection Time: 04/05/15  4:46 PM  Result Value Ref Range   Lactic Acid, Venous 2.43 (HH) 0.5 - 2.0 mmol/L   Comment NOTIFIED PHYSICIAN   Troponin I     Status: None   Collection Time: 04/05/15  6:24 PM  Result Value Ref Range   Troponin I 0.03 <0.031 ng/mL    Comment:        NO INDICATION OF MYOCARDIAL INJURY.     Imaging / Studies: Ct Abdomen Pelvis W Contrast  04/05/2015   CLINICAL DATA:  Vomiting for 2 days.  EXAM: CT ABDOMEN AND PELVIS WITH CONTRAST  TECHNIQUE: Multidetector CT imaging of the abdomen and pelvis was performed using the standard protocol following bolus administration of intravenous contrast.  CONTRAST:  8mL OMNIPAQUE IOHEXOL 300 MG/ML  SOLN  COMPARISON:  Abdominal series 04/05/2015.  CT 07/18/2011  FINDINGS: There is patchy bilateral airspace disease in the lower lungs. Findings are suggestive for pneumonia or aspiration. No large pleural effusions. There is enlargement of the heart, particularly the right atrium. Nasogastric tube extends into the proximal stomach but kinks back and terminates near the GE junction. The examination has limitations due to motion artifact. There is no evidence for free intraperitoneal air.  There is chronic dilatation of the intrahepatic and extrahepatic biliary system and post cholecystectomy. Extrahepatic bile duct measures up to 2.2 cm and similar to the prior examination. Chronic dilatation of the main pancreatic duct measuring 0.5 cm.  Portal venous system is patent.  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 No Cyrilla Durkin abnormality to the adrenal tissue. Probable cyst in left kidney upper pole. No acute abnormality involving the kidneys. Again noted are prominent exophytic cysts involving the right kidney.  Despite having having the nasogastric tube, there continues to be a fluid-filled stomach. Again noted are bowel structures anterior to the gastric antrum.  There are dilated loops of small bowel in the abdomen containing fluid and gas. Small bowel loops measure up to 3.8 cm. Evidence for fluid or edema in the left lower abdomen on sequence 2, image 52. Large amount of fluid in the pelvis probably represents a distended urinary bladder. There  is a left obturator hernia containing a loop of bowel with surrounding fluid. This appears to be the cause for the small bowel obstruction. Findings are compatible with an incarcerated hernia. In addition, there may be a small left femoral hernia. Evidence of previous right inguinal hernia repair. Evidence for rectocele along the left side the perineum. Uterus is absent. There are decompressed loops of small bowel in the right abdomen.  Probable large Tarlov cysts in the sacrum. Old compression fracture involving T12 vertebral body. Compression deformity involving the T11 vertebral body is new since 2012. Multilevel disc disease. Grade 1 anterolisthesis at L3-L4.  IMPRESSION: Small bowel obstruction secondary to an incarcerated left obturator hernia. Fluid surrounding the herniated bowel loop. Small amount of fluid and edema in the left lower abdomen. In addition, there is a small left femoral hernia.  Bilateral airspace disease in the lower lungs compatible with bilateral pneumonia and/or aspiration.  The nasogastric tube is kinked in the proximal stomach and the tip is pointing cephalad near the GE junction. Recommend repositioning of the nasogastric tube.   Chronic biliary dilatation.  Bilateral kidney cysts.  Compression fractures as described. T11 compression fracture is new since 2012.  These results were called by telephone at the time of interpretation on 04/05/2015 at 4:12 pm to [facility] , who verbally acknowledged these results.   Electronically Signed   By: Markus Daft M.D.   On: 04/05/2015 16:14   Dg Abd Acute W/chest  04/05/2015   CLINICAL DATA:  Nausea and vomiting for 3-4 days. Recent fall. Abdominal discomfort and distention.  EXAM: DG ABDOMEN ACUTE W/ 1V CHEST  COMPARISON:  04/03/2015 and 02/21/2015  FINDINGS: New densities in the left lower chest are concerning for airspace disease and pneumonia. Heart size is upper limits normal and stable. Atherosclerotic calcifications at the aortic arch. Scoliosis of the thoracolumbar spine. Dilated gas-filled loops of bowel in the lower abdomen. These appear to represent dilated small bowel loops, largest measuring up to 4.1 cm. No evidence for free air on the decubitus image. Surgical changes in the pelvis.  IMPRESSION: Dilated small bowel loops in the abdomen. Findings are concerning for a small bowel obstruction. No evidence for free air.  Concern for new airspace densities in the left lung which could represent an area of pneumonia or aspiration.   Electronically Signed   By: Markus Daft M.D.   On: 04/05/2015 12:31    Medications / Allergies: per chart  Antibiotics: Anti-infectives    Start     Dose/Rate Route Frequency Ordered Stop   04/06/15 0000  piperacillin-tazobactam (ZOSYN) IVPB 3.375 g     3.375 g 12.5 mL/hr over 240 Minutes Intravenous Every 8 hours 04/05/15 1809     04/05/15 2200  piperacillin-tazobactam (ZOSYN) IVPB 3.375 g  Status:  Discontinued     3.375 g 100 mL/hr over 30 Minutes Intravenous 3 times per day 04/05/15 1802 04/05/15 1805   04/05/15 1630  piperacillin-tazobactam (ZOSYN) IVPB 3.375 g     3.375 g 100 mL/hr over 30 Minutes Intravenous  Once 04/05/15 1623 04/05/15  1754      Assessment  Margaretha Glassing  81 y.o. female  Day of Surgery  Procedure(s): LAPAROSCOPY DIAGNOSTIC, POSSIBLE EXPLORATORY LAPAROTOMY, POSSIBLE SMALL BOWEL RESECTION  Problem List:  Principal Problem:   Incarcerated obturator hernia with SBO Active Problems:   Atrial fibrillation   Chronic asthma   Chronic respiratory failure   SBO (small bowel obstruction)   Incarcerated hernia   Patient  with bowel obstruction with probable transition zone in left obturator hernia with small bowel within it.  Advance early age with rapid different ventricular response.  Plan:  Extensive discussion with the patient's daughter, adopted daughter, son, grandchildren, pastors.  She is at high risk for surgery.  However, I think her risk of no surgery leads to imminent death.  Her only chance at living is surgery.  I did caution that risks of surgery high and this could affect quality of life.  However she was somewhat independent and active.  They feel she would wish to be aggressive.  Therefore we will proceed with laparotomy.  My partner, Dr. Excell Seltzer should be arriving soon to assume care.  Make sure she has IV antibodies.  ICU postoperatively.  The anatomy & physiology of the digestive tract was discussed.  The pathophysiology of perforation was discussed.  Differential diagnosis such as perforated ulcer or colon, etc was discussed.   Natural history risks without surgery such as death was discussed.  I recommended abdominal exploration to diagnose & treat the source of the problem.  Laparoscopic & open techniques were discussed.   Risks such as bleeding, infection, abscess, leak, reoperation, bowel resection, possible ostomy, hernia, heart attack, death, and other risks were discussed.   The risks of no intervention will lead to serious problems including death.   I expressed a good likelihood that surgery will address the problem.    Goals of post-operative recovery were discussed as  well.  We will work to minimize complications although risks in an emergent setting are high.   Questions were answered.  The family expressed understanding & wished to proceed with surgery.       -VTE prophylaxis- SCDs, etc -mobilize as tolerated to help recovery    Adin Hector, M.D., F.A.C.S. Gastrointestinal and Minimally Invasive Surgery [facility] [address], [place], [place] [address] [phone] Main / Paging   04/05/2015  Note: Portions of this report may have been transcribed using voice recognition software. Every effort was made to ensure accuracy; however, inadvertent computerized transcription errors may be present.   Any transcriptional errors that result from this process are unintentional.

## 2015-04-05 NOTE — Op Note (Signed)
Preoperative Diagnosis: Obturator hernia with obstruction [K45.0] SBO (small bowel obstruction) [K56.69]  IPostoprative Diagnosis: Obturator hernia with obstruction [K45.0] SBO (small bowel obstruction) [K56.69] Small bowel necrosis  Procedure: Procedure(s):  EXPLORATORY LAPAROTOMY, SMALL BOWEL RESECTION, REPAIR OF INCARCERATED HERNIA   Surgeon: Glenna Fellows T   Assistants: None  Anesthesia:  General endotracheal anesthesia  Indications: Patient is an 81 year old female who presents with 48 hours of intractable nausea and vomiting and abdominal distention. She has a past history of partial colectomy and small bowel resection for obstruction. CT scan shows an incarcerated left obturator hernia with high-grade small bowel obstruction. We have discussed the situation with the patient's family and reviewed the high risk nature of surgery. We discussed risks of respiratory failure, cardiac problems, bleeding, infection. We discussed that this would be a fatal problem If not operated upon. After discussion we have elected to proceed with exploratory laparotomy.    Procedure Detail:  Patient was brought to the operating room, placed in the supine position on the operating table, and general endotracheal anesthesia induced. Foley catheter was placed. She received broad-spectrum preoperative IV antibiotics. The abdomen was widely sterilely prepped and draped. Patient timeout was performed and correct procedure verified. I used the previous low midline incision and dissection was carried down through her minimal subcutaneous tissue and the midline fascia incised. I carefully sharply dissected down onto the midline and entered the free peritoneal cavity. There were remarkably few anterior abdominal wall adhesions. A few loops of small bowel were sharply taken down from the anterior abdominal wall near the incision. There were a few insignificant interloop adhesions within the abdomen. The small bowel  was markedly dilated. The patient was placed in Trendelenburg and I was able to follow the dilated bowel down to an incarcerated obturator hernia with completely decompressed small bowel exiting from the hernia defect. With careful blunt dissection into the hernia to widen it slightly and careful gentle traction on the bowel it was able to be reduced. There was a hernia sac with cloudy fluid. The hernia sac was completely stripped out of the hernia cavity. There was essentially a Richter's hernia but near total obstruction and a short area of necrosis without perforation of the small bowel. Initially I proceeded with repair of the obturator hernia. The peritoneal sac was excised. There was minimal if any soft tissue around the defect to close. I fashioned a 2 cm long plug of Flex HD which was inserted into the hernia defect and then was sutured and held in place with a single 0 Prolene suture anchoring it to the pubic ramus. Following this an approximately 3 cm piece of Flex HD was laid on top of this and the peritoneum was then closed over the top with a running 3-0 Monocryl. This appeared to obliterate the space. Following this a short small bowel resection was performed. The mesentery of the involved segment was divided between clamps and tied with 3-0 silk ties. Following this a functional end-to-end anastomosis was created with a single firing of the GIA 75 mm stapler. The staple line was intact and without bleeding. The common enterotomy was closed and the specimen Removed with a single firing of the TA 60 blue load stapler. The anastomosis was widely patent with good blood supply and was airtight to pressure. The crotch of the anastomosis was reinforced with a single 3-0 silk suture. The mesenteric defect was closed with interrupted 3-0 silk's. The viscera returned to their anatomic position. The abdomen was thoroughly irrigated  with saline. The midline fascia was closed with running 0 PDS begun at either of  the incision and tied centrally. This T's was irrigated and skin closed with staples. Sponge needle and instrument counts were correct.    Findings: Incarcerated left obturator hernia with small bowel obstruction and necrosis  Estimated Blood Loss:  Minimal         Drains: none  Blood Given: none          Specimens: Small bowel        Complications:  * No complications entered in OR log *         Disposition: PACU - hemodynamically stable.         Condition: stable

## 2015-04-05 NOTE — ED Provider Notes (Signed)
Medical screening examination/treatment/procedure(s) were conducted as a shared visit with non-physician practitioner(s) and myself.  I personally evaluated the patient during the encounter.   EKG Interpretation None       See the written copy of this report in the patient's paper medical record.  These results did not interface directly into the electronic medical record and are summarized here.  81 year old female with a chief complaint of feculent vomiting and abdominal pain. Likely small bowel obstruction patient has a history of the same. Bedside x-ray concerning for small bowel section images viewed by me. We'll obtain a CT scan surgery consult. NG tube placed secondary to persistent vomiting.  Admit.    Melene Plan, DO 04/05/15 1706

## 2015-04-05 NOTE — ED Notes (Signed)
Patient presents to the ED from home where she lives alone with complaints of vomiting for 2 days.  Patient states movement makes nausea worse.  Patient denies pain.  Patient vomiting Kailand Seda emesis on exam.  On exam, patients lung sounds are clear and diminished.  Heart sounds are rapid, but WNL, S1/S2, no rub, murmur or gallop.  +2 radial and pedal pulses.  No pre-tibial and pedal edema.  Patient's abdomen is distended and painful to palpation.  Bowel sounds are present, but hypoactive.  EOMI.  Skin warm and dry.

## 2015-04-05 NOTE — ED Provider Notes (Addendum)
CSN: [id number]     Arrival date & time 04/05/15  1056 History   First MD Initiated Contact with Patient 04/05/15 1114     Chief Complaint  Patient presents with  . Fall     (Consider location/radiation/quality/duration/timing/severity/associated sxs/prior Treatment) The history is provided by the patient and medical records. No language interpreter was used.     Emily Wyatt is a 81 y.o. female  with a hx of allergic colitis, A. fib, lower GI bleed, small bowel obstruction, anemia, diverticulosis, depression, anxiety presents to the Emergency Department complaining of gradual, persistent, progressively worsening abdominal pain with associated fecal emesis onset yesterday around 6:30 PM.  Patient has associated abdominal distention and generalized pain.  No hematemesis.  Associated symptoms include mechanical fall 4 days ago. She was evaluated for this by her primary care with negative x-rays of her hip, pelvis, knee and femur 2 days ago. She was dx with a groin strain and given Flexeril, Aleve and Tylenol. Patient reports that her last bowel movement was on Tuesday but it was very small and hard. Her last bowel obstruction was 6 or 7 years ago and required 2 surgeries.  Her care was managed at [facility] during that time. No higher rating or relieving factors.   Pt denies fever, chills, headache, neck pain, chest pain, shortness of breath, syncope, dysuria, hematuria.     Past Medical History  Diagnosis Date  . Diverticulitis   . Atrial fibrillation   . LGI bleed   . Anticoagulant long-term use   . Anemia due to blood loss   . Rectal prolapse   . Diverticulosis of colon   . Chronic headaches   . Gallstones   . IBS (irritable bowel syndrome)   . Bowel obstruction   . UTI (lower urinary tract infection)   . Depression   . Anxiety    Past Surgical History  Procedure Laterality Date  . Appendectomy    . Cholescystectomy    . Abdominal hysterectomy    . Small intestine surgery      Family History  Problem Relation Age of Onset  . Colon cancer Sister   . Heart disease Sister   . Stroke Mother    Social History  Substance Use Topics  . Smoking status: Former Smoker -- 0.50 packs/day for 5 years    Types: Cigarettes    Quit date: 08/04/1983  . Smokeless tobacco: Never Used  . Alcohol Use: No   OB History    No data available     Review of Systems  Constitutional: Negative for fever, diaphoresis, appetite change, fatigue and unexpected weight change.  HENT: Negative for mouth sores.   Eyes: Negative for visual disturbance.  Respiratory: Negative for cough, chest tightness, shortness of breath and wheezing.   Cardiovascular: Negative for chest pain.  Gastrointestinal: Positive for nausea, vomiting and abdominal pain. Negative for diarrhea and constipation.  Endocrine: Negative for polydipsia, polyphagia and polyuria.  Genitourinary: Negative for dysuria, urgency, frequency and hematuria.  Musculoskeletal: Negative for back pain and neck stiffness.  Skin: Negative for rash.  Allergic/Immunologic: Negative for immunocompromised state.  Neurological: Negative for syncope, light-headedness and headaches.  Hematological: Does not bruise/bleed easily.  Psychiatric/Behavioral: Negative for sleep disturbance. The patient is not nervous/anxious.        Allergies  Codeine; Morphine and related; and Promethazine hcl  Home Medications   Prior to Admission medications   Medication Sig Start Date End Date Taking? Authorizing Provider  acetaminophen (TYLENOL) 325 MG  tablet Take 650 mg by mouth every 6 (six) hours as needed.   Yes Historical Provider, MD  ADVAIR DISKUS 250-50 MCG/DOSE AEPB INHALE ONE PUFF BY MOUTH EVERY 12 HOURS Patient taking differently: Inhale 2 puffs twice daily 02/22/15  Yes Nyoka Cowden, MD  albuterol (PROVENTIL HFA;VENTOLIN HFA) 108 (90 BASE) MCG/ACT inhaler Inhale 2 puffs into the lungs every 4 (four) hours as needed for wheezing or  shortness of breath. 07/30/13  Yes Azalia Bilis, MD  aspirin 81 MG tablet Take 81 mg by mouth daily.   Yes Historical Provider, MD  BIOTIN PO Take 1,000 mg by mouth 2 (two) times daily.    Yes Historical Provider, MD  cholecalciferol (VITAMIN D) 1000 UNITS tablet Take 1,000 Units by mouth daily.   Yes Historical Provider, MD  cyanocobalamin 1000 MCG tablet Take 100 mcg by mouth daily.   Yes Historical Provider, MD  cyclobenzaprine (FLEXERIL) 10 MG tablet Take 5 mg by mouth 3 (three) times daily as needed for muscle spasms.   Yes Historical Provider, MD  diltiazem (TIAZAC) 180 MG 24 hr capsule Take 1 capsule (180 mg total) by mouth daily. 09/10/14  Yes Chrystie Nose, MD  docusate sodium (COLACE) 100 MG capsule Take 100 mg by mouth daily as needed for mild constipation. Stool softenener   Yes Historical Provider, MD  escitalopram (LEXAPRO) 10 MG tablet Take 15 mg by mouth daily.   Yes Historical Provider, MD  Ibuprofen 200 MG CAPS Take 1 capsule by mouth daily as needed.   Yes Historical Provider, MD  naproxen sodium (ANAPROX) 220 MG tablet Take 220 mg by mouth 3 (three) times daily as needed.   Yes Historical Provider, MD  Potassium Gluconate 595 MG CAPS Take 1 capsule by mouth daily.   Yes Historical Provider, MD  Dextromethorphan-Guaifenesin [facility] DM PO) Take by mouth 2 (two) times daily.    Historical Provider, MD   BP 142/82 mmHg  Pulse 130  Temp(Src) 99.9 F (37.7 C) (Rectal)  Resp 24  Ht  (1.651 m)  Wt 99 lb (44.906 kg)  BMI 16.47 kg/m2  SpO2 95% Physical Exam  Constitutional: She appears well-developed and well-nourished. No distress.  Awake, alert, ill appearance Vomiting fecal emesis   HENT:  Head: Normocephalic and atraumatic.  Mouth/Throat: Oropharynx is clear and moist. No oropharyngeal exudate.  Eyes: Conjunctivae are normal. No scleral icterus.  Neck: Normal range of motion. Neck supple.  Cardiovascular: Regular rhythm, normal heart sounds and intact distal  pulses.  Tachycardia present.   Pulses:      Radial pulses are 2+ on the right side, and 2+ on the left side.       Dorsalis pedis pulses are 2+ on the right side, and 2+ on the left side.  Pulmonary/Chest: Effort normal and breath sounds normal. No respiratory distress. She has no wheezes.  Equal chest expansion  Abdominal: Soft. She exhibits distension. She exhibits no mass. There is tenderness. There is guarding. There is no rebound and no CVA tenderness.  High-pitched bowel sounds heard throughout the abdomen Generalized abdominal tenderness with marked distention and palpable loops of bowel  Musculoskeletal: Normal range of motion. She exhibits no edema.  Neurological: She is alert.  Speech is clear and goal oriented Moves extremities without ataxia  Skin: Skin is warm and dry. She is not diaphoretic. No erythema.  Psychiatric: She has a normal mood and affect.  Nursing note and vitals reviewed.   ED Course  Procedures (including critical care  time) Labs Review Labs Reviewed  COMPREHENSIVE METABOLIC PANEL - Abnormal; Notable for the following:    Sodium 134 (*)    Chloride 96 (*)    Glucose, Bld 141 (*)    BUN 39 (*)    AST 103 (*)    ALT 94 (*)    Total Bilirubin 1.5 (*)    GFR calc non Af Amer 57 (*)    All other components within normal limits  CBC - Abnormal; Notable for the following:    RBC 3.85 (*)    All other components within normal limits  I-STAT CHEM 8, ED - Abnormal; Notable for the following:    Sodium 131 (*)    Chloride 96 (*)    BUN 52 (*)    Glucose, Bld 137 (*)    Calcium, Ion 1.09 (*)    All other components within normal limits  I-STAT CG4 LACTIC ACID, ED - Abnormal; Notable for the following:    Lactic Acid, Venous 2.43 (*)    All other components within normal limits  URINALYSIS, ROUTINE W REFLEX MICROSCOPIC (NOT AT [facility])  I-STAT CG4 LACTIC ACID, ED    Imaging Review Ct Abdomen Pelvis W Contrast  04/05/2015   CLINICAL DATA:  Vomiting for 2  days.  EXAM: CT ABDOMEN AND PELVIS WITH CONTRAST  TECHNIQUE: Multidetector CT imaging of the abdomen and pelvis was performed using the standard protocol following bolus administration of intravenous contrast.  CONTRAST:  80mL OMNIPAQUE IOHEXOL 300 MG/ML  SOLN  COMPARISON:  Abdominal series 04/05/2015.  CT 07/18/2011  FINDINGS: There is patchy bilateral airspace disease in the lower lungs. Findings are suggestive for pneumonia or aspiration. No large pleural effusions. There is enlargement of the heart, particularly the right atrium. Nasogastric tube extends into the proximal stomach but kinks back and terminates near the GE junction. The examination has limitations due to motion artifact. There is no evidence for free intraperitoneal air.  There is chronic dilatation of the intrahepatic and extrahepatic biliary system and post cholecystectomy. Extrahepatic bile duct measures up to 2.2 cm and similar to the prior examination. Chronic dilatation of the main pancreatic duct measuring 0.5 cm. Portal venous system is patent.  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 No gross abnormality to the adrenal tissue. Probable cyst in left kidney upper pole. No acute abnormality involving the kidneys. Again noted are prominent exophytic cysts involving the right kidney.  Despite having having the nasogastric tube, there continues to be a fluid-filled stomach. Again noted are bowel structures anterior to the gastric antrum.  There are dilated loops of small bowel in the abdomen containing fluid and gas. Small bowel loops measure up to 3.8 cm. Evidence for fluid or edema in the left lower abdomen on sequence 2, image 52. Large amount of fluid in the pelvis probably represents a distended urinary bladder. There is a left obturator hernia containing a loop of bowel with surrounding fluid. This appears to be the cause for the small bowel obstruction. Findings are compatible with an  incarcerated hernia. In addition, there may be a small left femoral hernia. Evidence of previous right inguinal hernia repair. Evidence for rectocele along the left side the perineum. Uterus is absent. There are decompressed loops of small bowel in the right abdomen.  Probable large Tarlov cysts in the sacrum. Old compression fracture involving T12 vertebral body. Compression deformity involving the T11 vertebral body is new since 2012. Multilevel disc disease.  Grade 1 anterolisthesis at L3-L4.  IMPRESSION: Small bowel obstruction secondary to an incarcerated left obturator hernia. Fluid surrounding the herniated bowel loop. Small amount of fluid and edema in the left lower abdomen. In addition, there is a small left femoral hernia.  Bilateral airspace disease in the lower lungs compatible with bilateral pneumonia and/or aspiration.  The nasogastric tube is kinked in the proximal stomach and the tip is pointing cephalad near the GE junction. Recommend repositioning of the nasogastric tube.  Chronic biliary dilatation.  Bilateral kidney cysts.  Compression fractures as described. T11 compression fracture is new since 2012.  These results were called by telephone at the time of interpretation on 04/05/2015 at 4:12 pm to Shawnelle Spoerl , who verbally acknowledged these results.   Electronically Signed   By: Adam  Henn M.D.   On: 04/05/2015 16:14   Dg Abd Acute W/chest  04/05/2015   CLINICAL DATA:  Nausea and vomiting for 3-4 days. Recent fall. Abdominal discomfort and distention.  EXAM: DG ABDOMEN ACUTE W/ 1V CHEST  COMPARISON:  04/03/2015 and 02/21/2015  FINDINGS: New densities in the left lower chest are concerning for airspace disease and pneumonia. Heart size is upper limits normal and stable. Atherosclerotic calcifications at the aortic arch. Scoliosis of the thoracolumbar spine. Dilated gas-filled loops of bowel in the lower abdomen. These appear to represent dilated small bowel loops, largest measuring up  to 4.1 cm. No evidence for free air on the decubitus image. Surgical changes in the pelvis.  IMPRESSION: Dilated small bowel loops in the abdomen. Findings are concerning for a small bowel obstruction. No evidence for free air.  Concern for new airspace densities in the left lung which could represent an area of pneumonia or aspiration.   Electronically Signed   By: Adam  Henn M.D.   On: 04/05/2015 12:31   I have personally reviewed and evaluated these images and lab results as part of my medical decision-making.   EKG Interpretation None      CRITICAL CARE Performed by: Shakeena Kafer Total critical care time: <MEASUREKentuEarlElbeMarland KitchenrtR<MEASUREMEKentEarlElbeMarland KitchenrtR<MEASUREMEKentEarlElbeMarland KitchenrtR<MEASUREMEKentEarlElbeMarland KitchenrtR<MEASUREMEKentEarlElbeMarland KitchenrtR<MEASUREMEKentEarlElbeMarland KitchenrtR<MEASUREMEKentEarlElbeMarland KitchenrtR<MEASUREMEKentEarlElbeMarland KitchenrtR<MEASUREMEKentEarlElbeMarland KitchenrtR<MEASUREMEKentEarlElbeMarland KitchenrtR<MEASUREMEKentEarlElbeMarland KitchenrtR<MEASUREMEKentEarlElbeMarland KitchenrtR<MEASUREMEKentEarlElbeMarland KitchenrtR<MEASUREMEKentEarlElbeMarland KitchenrtR<MEASUREMEKentEarlElbeMarland KitchenrtR<MEASUREMEKentEarlElbeMarland KitchenrtR<MEASUREMEKentEarlElbeMarland KitchenrtR<MEASUREMEKentEarlElbeMarland KitchenrtR<MEASUREMEKentEarlElbeMarland KitchenrtR<MEASUREMEKentEarlElbeMarland KitchenrtR<MEASUREMEKentEarlElbeMarland KitchenrtR<MEASUREMEKentEarlElbeMarland KitchenrtR<MEASUREMEKentEarlElbeMarland KitchenrtR<MEASUREMEKentEarlElbeMarland KitchenrtR<MEASUREMEKentEarlElbeMarland KitchenrtR<MEASUREMEKentEarlElbeMarland KitchenrtR<MEASUREMEKentEarlElbeMarland KitchenrtR<MEASUREMEKentEarlElbeMarland KitchenrtR<MEASUREMEKentEarlElbeMarland KitchenrtR<MEASUREMEKentEarlElbeMarland KitchenrtR<MEASUREMEKentEarlElbeMarland KitchenrtR<MEASUREMEKentEarlElbeMarland KitchenrtR<MEASUREMEKentEarlElbeMarland KitchenrtR<MEASUREMEKentuckyEarlElbMarland Kitchene<MEASUREMEKentEarlElbeMarland KitchenrtR<MEASUREMEKentEarlElbeMarland KitchenrtR<MEASUREMEKentEarlElbeMarland KitchenrtRoslyn Smilinglaure time was exclusive of separately billable procedures and treating other patients. Critical care was necessary to treat or prevent imminent or life-threatening deterioration. Critical care was time spent personally by me on the following activities: development of treatment plan with patient and/or surrogate as well as nursing, discussions with consultants, evaluation of patient's response to treatment, examination of patient, obtaining history from patient or surrogate, ordering and performing treatments and interventions, ordering and review of laboratory studies, ordering and review of radiographic studies, pulse oximetry and re-evaluation of patient's condition.   MDM   Final diagnoses:  Obturator hernia with obstruction  SBO (small bowel obstruction)  Aspiration pneumonia, unspecified aspiration pneumonia type   Keaja C Malek presents with feculent emesis.  Palpable large loops of bowel and high-pitched L sounds. Suspect small bowel obstruction. Will place in NG-tube, obtain plain films and lab work.  1:00 PM Plain film with dilated small bowel loops in the abdomen consistent with small bowel obstruction. No free air at this time. Will obtain CT scan and consult surgery. . 4:09 PM CT discussed with Dr. Henn.  Pt with incarcerated left obturator  hernia leading to SBO.  Patient also with bilateral airspace disease in the lower lungs compatible with likely aspiration.  In addition patient has a T11 compression fracture which is new likely from her fall on Tuesday.  BP 142/82 mmHg  Pulse 130  Temp(Src) 99.9 F (37.7 C) (Rectal)  Resp 24  Ht 5\' 5"  (1.651 m)  Wt 99 lb (44.906 kg)  BMI 16.47 kg/m2  SpO2 95%  Will consult surgery.  Zosyn started.  Persistent tachycardia.  Pt is receiving fluids.    5:04 PM Discussed with Dr. Michaell Cowing who will plan for surgery tonight.    The patient was discussed with and seen by Dr. Adela Lank who agrees with the treatment plan.   Dierdre Forth, PA-C 04/05/15 1705  Melene Plan, DO 04/05/15 1707  5:32 PM Surgery requests medical admission.  Pt discussed with Dr/ Susie Cassette who will admit.      Dahlia Client Dalya Maselli, PA-C 04/05/15 1733  Melene Plan, DO 04/06/15 1536

## 2015-04-05 NOTE — ED Notes (Addendum)
Per pt/family-states she got up to use the bathroom and fell on right side-was evaluated by PCP on Wednesday-had multiple x-rays-all were negative-on flexeril for a pulled groin on the left-per family states she has no had a normal BM in awhile-states she is vomiting brown liquid which smells like poop-abdominal distension

## 2015-04-06 ENCOUNTER — Inpatient Hospital Stay (HOSPITAL_COMMUNITY): Payer: Medicare Other

## 2015-04-06 DIAGNOSIS — J690 Pneumonitis due to inhalation of food and vomit: Secondary | ICD-10-CM | POA: Insufficient documentation

## 2015-04-06 DIAGNOSIS — J9600 Acute respiratory failure, unspecified whether with hypoxia or hypercapnia: Secondary | ICD-10-CM

## 2015-04-06 LAB — URINE MICROSCOPIC-ADD ON

## 2015-04-06 LAB — COMPREHENSIVE METABOLIC PANEL
ALT: 59 U/L — AB (ref 14–54)
ANION GAP: 9 (ref 5–15)
AST: 55 U/L — ABNORMAL HIGH (ref 15–41)
Albumin: 2.9 g/dL — ABNORMAL LOW (ref 3.5–5.0)
Alkaline Phosphatase: 62 U/L (ref 38–126)
BUN: 27 mg/dL — ABNORMAL HIGH (ref 6–20)
CHLORIDE: 103 mmol/L (ref 101–111)
CO2: 21 mmol/L — AB (ref 22–32)
CREATININE: 0.5 mg/dL (ref 0.44–1.00)
Calcium: 7.8 mg/dL — ABNORMAL LOW (ref 8.9–10.3)
Glucose, Bld: 115 mg/dL — ABNORMAL HIGH (ref 65–99)
POTASSIUM: 3.1 mmol/L — AB (ref 3.5–5.1)
SODIUM: 133 mmol/L — AB (ref 135–145)
Total Bilirubin: 1.8 mg/dL — ABNORMAL HIGH (ref 0.3–1.2)
Total Protein: 5.3 g/dL — ABNORMAL LOW (ref 6.5–8.1)

## 2015-04-06 LAB — URINALYSIS, ROUTINE W REFLEX MICROSCOPIC
GLUCOSE, UA: NEGATIVE mg/dL
KETONES UR: 40 mg/dL — AB
Leukocytes, UA: NEGATIVE
Nitrite: NEGATIVE
PROTEIN: 30 mg/dL — AB
Specific Gravity, Urine: 1.036 — ABNORMAL HIGH (ref 1.005–1.030)
UROBILINOGEN UA: 1 mg/dL (ref 0.0–1.0)
pH: 6 (ref 5.0–8.0)

## 2015-04-06 LAB — MAGNESIUM: MAGNESIUM: 1.5 mg/dL — AB (ref 1.7–2.4)

## 2015-04-06 LAB — MRSA PCR SCREENING: MRSA by PCR: NEGATIVE

## 2015-04-06 LAB — CBC
HCT: 30.9 % — ABNORMAL LOW (ref 36.0–46.0)
HEMOGLOBIN: 9.9 g/dL — AB (ref 12.0–15.0)
MCH: 30.7 pg (ref 26.0–34.0)
MCHC: 32 g/dL (ref 30.0–36.0)
MCV: 95.7 fL (ref 78.0–100.0)
PLATELETS: 157 10*3/uL (ref 150–400)
RBC: 3.23 MIL/uL — AB (ref 3.87–5.11)
RDW: 14.6 % (ref 11.5–15.5)
WBC: 4.3 10*3/uL (ref 4.0–10.5)

## 2015-04-06 LAB — TROPONIN I
TROPONIN I: 0.04 ng/mL — AB (ref <0.031)
TROPONIN I: 0.05 ng/mL — AB (ref <0.031)

## 2015-04-06 LAB — OSMOLALITY, URINE: OSMOLALITY UR: 716 mosm/kg (ref 390–1090)

## 2015-04-06 LAB — SODIUM, URINE, RANDOM: SODIUM UR: 18 mmol/L

## 2015-04-06 LAB — GLUCOSE, CAPILLARY: GLUCOSE-CAPILLARY: 93 mg/dL (ref 65–99)

## 2015-04-06 MED ORDER — DIGOXIN 0.25 MG/ML IJ SOLN
0.2500 mg | Freq: Once | INTRAMUSCULAR | Status: AC
  Administered 2015-04-06: 0.25 mg via INTRAVENOUS
  Filled 2015-04-06 (×2): qty 1

## 2015-04-06 MED ORDER — DILTIAZEM LOAD VIA INFUSION
10.0000 mg | Freq: Once | INTRAVENOUS | Status: AC
  Administered 2015-04-06: 10 mg via INTRAVENOUS
  Filled 2015-04-06: qty 10

## 2015-04-06 MED ORDER — CHLORHEXIDINE GLUCONATE 0.12% ORAL RINSE (MEDLINE KIT)
15.0000 mL | Freq: Two times a day (BID) | OROMUCOSAL | Status: DC
  Administered 2015-04-06 – 2015-04-18 (×21): 15 mL via OROMUCOSAL
  Filled 2015-04-06: qty 15

## 2015-04-06 MED ORDER — DIGOXIN 0.25 MG/ML IJ SOLN
0.5000 mg | Freq: Once | INTRAMUSCULAR | Status: AC
  Administered 2015-04-06: 0.5 mg via INTRAVENOUS
  Filled 2015-04-06: qty 2

## 2015-04-06 MED ORDER — DEXTROSE 5 % IV SOLN
5.0000 mg/h | INTRAVENOUS | Status: DC
  Administered 2015-04-06: 10 mg/h via INTRAVENOUS
  Administered 2015-04-06: 15 mg/h via INTRAVENOUS
  Administered 2015-04-07: 10 mg/h via INTRAVENOUS
  Filled 2015-04-06 (×3): qty 100

## 2015-04-06 MED ORDER — MAGNESIUM SULFATE 2 GM/50ML IV SOLN
2.0000 g | Freq: Once | INTRAVENOUS | Status: AC
  Administered 2015-04-06: 2 g via INTRAVENOUS
  Filled 2015-04-06: qty 50

## 2015-04-06 MED ORDER — SODIUM CHLORIDE 0.9 % IV BOLUS (SEPSIS)
1000.0000 mL | Freq: Once | INTRAVENOUS | Status: AC
  Administered 2015-04-06: 1000 mL via INTRAVENOUS

## 2015-04-06 MED ORDER — ANTISEPTIC ORAL RINSE SOLUTION (CORINZ)
7.0000 mL | Freq: Four times a day (QID) | OROMUCOSAL | Status: DC
  Administered 2015-04-06 – 2015-04-18 (×42): 7 mL via OROMUCOSAL

## 2015-04-06 MED ORDER — POTASSIUM CHLORIDE 10 MEQ/100ML IV SOLN
10.0000 meq | INTRAVENOUS | Status: AC
  Administered 2015-04-06 (×4): 10 meq via INTRAVENOUS
  Filled 2015-04-06: qty 100

## 2015-04-06 MED ORDER — MAGNESIUM SULFATE 50 % IJ SOLN
2.0000 g | Freq: Once | INTRAVENOUS | Status: DC
  Filled 2015-04-06: qty 4

## 2015-04-06 NOTE — Progress Notes (Signed)
Explained to multiple family members that the patient was on contact isolation for VRE.  Family members refused to wear isolation gowns and gloves while in the patient's room.  Continue to monitor patient,.  Hassan Blackshire Debroah Loop RN

## 2015-04-06 NOTE — Consult Note (Signed)
PULMONARY / CRITICAL CARE MEDICINE   Name: Emily Wyatt MRN: [id number] DOB: 04/27/1927    ADMISSION DATE:  04/05/2015 CONSULTATION DATE:  04/05/2015  REFERRING MD :  Dr. Susie Cassette  CHIEF COMPLAINT:  Nausea vomiting  INITIAL PRESENTATION: ischemic bowel to the OR  SIGNIFICANT EVENTS: Ex lap today  9/3 vented  SUBJECTIVE:  Remained vented post op  VITAL SIGNS: Temp:  [97.5 F (36.4 C)-100.3 F (37.9 C)] 100.3 F (37.9 C) (09/03 0300) Pulse Rate:  [100-145] 100 (09/03 0806) Resp:  [14-27] 14 (09/03 0806) BP: (106-155)/(55-91) 107/74 mmHg (09/03 0806) SpO2:  [95 %-100 %] 99 % (09/03 0806) FiO2 (%):  [30 %-100 %] 30 % (09/03 0806) Weight:  [44.906 kg (99 lb)-50.1 kg (110 lb 7.2 oz)] 50.1 kg (110 lb 7.2 oz) (09/02 2300) HEMODYNAMICS:   VENTILATOR SETTINGS: Vent Mode:  [-] PRVC FiO2 (%):  [30 %-100 %] 30 % Set Rate:  [14 bmp] 14 bmp Vt Set:  [480 mL] 480 mL PEEP:  [5 cmH20] 5 cmH20 Plateau Pressure:  [14 cmH20-17 cmH20] 17 cmH20 INTAKE / OUTPUT:  Intake/Output Summary (Last 24 hours) at 04/06/15 0830 Last data filed at 04/06/15 0800  Gross per 24 hour  Intake 2871.42 ml  Output    400 ml  Net 2471.42 ml    PHYSICAL EXAMINATION: General:  Intubated and sedated rass -2, not fc Neuro:  rass -2, sporadic movements ext, perr HEENT:   ETT in place   Cardiovascular:  Irreg Rate/rhythm Lungs:  CTA  Abdomen:  Soft, dressing in place / dry. No bowel sounds   LABS:  CBC  Recent Labs Lab 04/05/15 1157 04/05/15 1231 04/06/15 0546  WBC 8.1  --  4.3  HGB 12.1 13.9 9.9*  HCT 36.5 41.0 30.9*  PLT 238  --  157   Coag's No results for input(s): APTT, INR in the last 168 hours. BMET  Recent Labs Lab 04/05/15 1157 04/05/15 1231 04/06/15 0546  NA 134* 131* 133*  K 4.2 4.7 3.1*  CL 96* 96* 103  CO2 26  --  21*  BUN 39* 52* 27*  CREATININE 0.89 0.90 0.50  GLUCOSE 141* 137* 115*   Electrolytes  Recent Labs Lab 04/05/15 1157 04/05/15 2330 04/06/15 0546   CALCIUM 9.6  --  7.8*  MG  --  1.5*  --    Sepsis Markers  Recent Labs Lab 04/05/15 1210 04/05/15 1646  LATICACIDVEN 1.71 2.43*   ABG  Recent Labs Lab 04/05/15 2300  PHART 7.431  PCO2ART 35.4  PO2ART 411*   Liver Enzymes  Recent Labs Lab 04/05/15 1157 04/06/15 0546  AST 103* 55*  ALT 94* 59*  ALKPHOS 95 62  BILITOT 1.5* 1.8*  ALBUMIN 4.4 2.9*   Cardiac Enzymes  Recent Labs Lab 04/05/15 1824 04/05/15 2330 04/06/15 0546  TROPONINI 0.03 0.05* 0.04*   Glucose No results for input(s): GLUCAP in the last 168 hours.  Imaging Ct Abdomen Pelvis W Contrast  04/05/2015   CLINICAL DATA:  Vomiting for 2 days.  EXAM: CT ABDOMEN AND PELVIS WITH CONTRAST  TECHNIQUE: Multidetector CT imaging of the abdomen and pelvis was performed using the standard protocol following bolus administration of intravenous contrast.  CONTRAST:  80mL OMNIPAQUE IOHEXOL 300 MG/ML  SOLN  COMPARISON:  Abdominal series 04/05/2015.  CT 07/18/2011  FINDINGS: There is patchy bilateral airspace disease in the lower lungs. Findings are suggestive for pneumonia or aspiration. No large pleural effusions. There is enlargement of the heart, particularly the right atrium.  Nasogastric tube extends into the proximal stomach but kinks back and terminates near the GE junction. The examination has limitations due to motion artifact. There is no evidence for free intraperitoneal air.  There is chronic dilatation of the intrahepatic and extrahepatic biliary system and post cholecystectomy. Extrahepatic bile duct measures up to 2.2 cm and similar to the prior examination. Chronic dilatation of the main pancreatic duct measuring 0.5 cm. Portal venous system is patent.  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 No gross abnormality to the adrenal tissue. Probable cyst in left kidney upper pole. No acute abnormality involving the kidneys. Again noted are prominent exophytic cysts involving  the right kidney.  Despite having having the nasogastric tube, there continues to be a fluid-filled stomach. Again noted are bowel structures anterior to the gastric antrum.  There are dilated loops of small bowel in the abdomen containing fluid and gas. Small bowel loops measure up to 3.8 cm. Evidence for fluid or edema in the left lower abdomen on sequence 2, image 52. Large amount of fluid in the pelvis probably represents a distended urinary bladder. There is a left obturator hernia containing a loop of bowel with surrounding fluid. This appears to be the cause for the small bowel obstruction. Findings are compatible with an incarcerated hernia. In addition, there may be a small left femoral hernia. Evidence of previous right inguinal hernia repair. Evidence for rectocele along the left side the perineum. Uterus is absent. There are decompressed loops of small bowel in the right abdomen.  Probable large Tarlov cysts in the sacrum. Old compression fracture involving T12 vertebral body. Compression deformity involving the T11 vertebral body is new since 2012. Multilevel disc disease. Grade 1 anterolisthesis at L3-L4.  IMPRESSION: Small bowel obstruction secondary to an incarcerated left obturator hernia. Fluid surrounding the herniated bowel loop. Small amount of fluid and edema in the left lower abdomen. In addition, there is a small left femoral hernia.  Bilateral airspace disease in the lower lungs compatible with bilateral pneumonia and/or aspiration.  The nasogastric tube is kinked in the proximal stomach and the tip is pointing cephalad near the GE junction. Recommend repositioning of the nasogastric tube.  Chronic biliary dilatation.  Bilateral kidney cysts.  Compression fractures as described. T11 compression fracture is new since 2012.  These results were called by telephone at the time of interpretation on 04/05/2015 at 4:12 pm to [facility] , who verbally acknowledged these results.    Electronically Signed   By: Richarda Overlie M.D.   On: 04/05/2015 16:14   Portable Chest 1 View  04/06/2015   CLINICAL DATA:  Endotracheal tube placement. Intractable nausea and vomiting.  EXAM: PORTABLE CHEST - 1 VIEW  COMPARISON:  Chest radiograph April 05, 2015  FINDINGS: Interval intubation, distal tip projects 4 cm above the carina. Nasogastric tube tip and side port projects in proximal stomach.  Stable cardiomegaly. Calcified aortic knob. Diffuse interstitial prominence, patchy lower lobe airspace opacities, increased on the RIGHT. No pleural effusion. No pneumothorax. RIGHT apical pleural thickening. Patient is osteopenic.  IMPRESSION: ET tube tip projects 4 cm above the carina. Nasogastric tube tip projects in proximal stomach.  Interstitial prominence, increasing bibasilar consolidation concerning for bronchopneumonia.  Stable cardiomegaly.   Electronically Signed   By: Awilda Metro M.D.   On: 04/06/2015 03:48   Dg Abd Acute W/chest  04/05/2015   CLINICAL DATA:  Nausea and vomiting for 3-4 days. Recent fall. Abdominal  discomfort and distention.  EXAM: DG ABDOMEN ACUTE W/ 1V CHEST  COMPARISON:  04/03/2015 and 02/21/2015  FINDINGS: New densities in the left lower chest are concerning for airspace disease and pneumonia. Heart size is upper limits normal and stable. Atherosclerotic calcifications at the aortic arch. Scoliosis of the thoracolumbar spine. Dilated gas-filled loops of bowel in the lower abdomen. These appear to represent dilated small bowel loops, largest measuring up to 4.1 cm. No evidence for free air on the decubitus image. Surgical changes in the pelvis.  IMPRESSION: Dilated small bowel loops in the abdomen. Findings are concerning for a small bowel obstruction. No evidence for free air.  Concern for new airspace densities in the left lung which could represent an area of pneumonia or aspiration.   Electronically Signed   By: Richarda Overlie M.D.   On: 04/05/2015 12:31     ASSESSMENT /  PLAN:  PULMONARY  A:  ARF Afib mild RVR aspiration pneumonits/ PNA Hypokalemia LActic Hyponatremia Prior hypomagenesmia P:   ABG reviewed (30% rate 14, 460 , peep 5), consider down by 1 cc/kg Weaning this am CPAP 5 ps 5, goal 2 hr, await improved neurostatus for extubation With wean attempt was apnic, stop all sedation then re assess pcxr again in am with NEW rt base infiltrate K supp Send serum osm, urine na, osm,may need line Consider low dose dilt infusion as short acting agent if fail a dig load ppi Npo lft in am  Maintain zosyn ensure sputum sent UA  FAMILY  - Updates: updated today at the bedside   Total critical care time: 30 min  Mcarthur Rossetti. Tyson Alias, MD, FACP Pgr: [phone] [facility]

## 2015-04-06 NOTE — Progress Notes (Signed)
Patient wean was terminated per Ve of <3.0. Patient is responsive to voice, but drowsy. RT will attempt to wean again when patient is more awake. RT will continue to monitor.

## 2015-04-06 NOTE — Progress Notes (Signed)
eLink Physician-Brief Progress Note Patient Name: Emily Wyatt DOB: Aug 05, 1926 MRN: [id number]   Date of Service  04/06/2015  HPI/Events of Note  A fib rvr hr 140  eICU Interventions  Rx saline bolus first     Intervention Category Intermediate Interventions: Arrhythmia - evaluation and management  Kiva Norland 04/06/2015, 2:56 AM

## 2015-04-06 NOTE — Progress Notes (Signed)
Patient ID: Emily Wyatt, female   DOB: [birth year], 81 y.o.   MRN: [id number] 1 Day Post-Op  Subjective: Sedated on vent and not responsive  Objective: Vital signs in last 24 hours: Temp:  [97.5 F (36.4 C)-100.3 F (37.9 C)] 100.3 F (37.9 C) (09/03 0300) Pulse Rate:  [111-131] 127 (09/03 0400) Resp:  [14-27] 15 (09/03 0400) BP: (119-155)/(59-91) 135/70 mmHg (09/03 0400) SpO2:  [95 %-100 %] 99 % (09/03 0400) FiO2 (%):  [30 %-100 %] 30 % (09/03 0430) Weight:  [44.906 kg (99 lb)-50.1 kg (110 lb 7.2 oz)] 50.1 kg (110 lb 7.2 oz) (09/02 2300) Last BM Date:  (pta)  Intake/Output from previous day: 09/02 0701 - 09/03 0700 In: 2591.4 [I.V.:2491.4; IV Piggyback:100] Out: 400 [Urine:100; Emesis/NG output:300] Intake/Output this shift:    General appearance: Very thin, sedated on vent, comfortable Resp: clear to auscultation bilaterally GI: Soft without apparent tenderness, mild distention Incision/Wound: Dressing clean and dry  Lab Results:   Recent Labs  04/05/15 1157 04/05/15 1231 04/06/15 0546  WBC 8.1  --  4.3  HGB 12.1 13.9 9.9*  HCT 36.5 41.0 30.9*  PLT 238  --  157   BMET  Recent Labs  04/05/15 1157 04/05/15 1231 04/06/15 0546  NA 134* 131* 133*  K 4.2 4.7 3.1*  CL 96* 96* 103  CO2 26  --  21*  GLUCOSE 141* 137* 115*  BUN 39* 52* 27*  CREATININE 0.89 0.90 0.50  CALCIUM 9.6  --  7.8*     Studies/Results: Ct Abdomen Pelvis W Contrast  04/05/2015   CLINICAL DATA:  Vomiting for 2 days.  EXAM: CT ABDOMEN AND PELVIS WITH CONTRAST  TECHNIQUE: Multidetector CT imaging of the abdomen and pelvis was performed using the standard protocol following bolus administration of intravenous contrast.  CONTRAST:  80mL OMNIPAQUE IOHEXOL 300 MG/ML  SOLN  COMPARISON:  Abdominal series 04/05/2015.  CT 07/18/2011  FINDINGS: There is patchy bilateral airspace disease in the lower lungs. Findings are suggestive for pneumonia or aspiration. No large pleural effusions. There is  enlargement of the heart, particularly the right atrium. Nasogastric tube extends into the proximal stomach but kinks back and terminates near the GE junction. The examination has limitations due to motion artifact. There is no evidence for free intraperitoneal air.  There is chronic dilatation of the intrahepatic and extrahepatic biliary system and post cholecystectomy. Extrahepatic bile duct measures up to 2.2 cm and similar to the prior examination. Chronic dilatation of the main pancreatic duct measuring 0.5 cm. Portal venous system is patent.  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 No gross abnormality to the adrenal tissue. Probable cyst in left kidney upper pole. No acute abnormality involving the kidneys. Again noted are prominent exophytic cysts involving the right kidney.  Despite having having the nasogastric tube, there continues to be a fluid-filled stomach. Again noted are bowel structures anterior to the gastric antrum.  There are dilated loops of small bowel in the abdomen containing fluid and gas. Small bowel loops measure up to 3.8 cm. Evidence for fluid or edema in the left lower abdomen on sequence 2, image 52. Large amount of fluid in the pelvis probably represents a distended urinary bladder. There is a left obturator hernia containing a loop of bowel with surrounding fluid. This appears to be the cause for the small bowel obstruction. Findings are compatible with an incarcerated hernia. In addition, there may be a small left femoral  hernia. Evidence of previous right inguinal hernia repair. Evidence for rectocele along the left side the perineum. Uterus is absent. There are decompressed loops of small bowel in the right abdomen.  Probable large Tarlov cysts in the sacrum. Old compression fracture involving T12 vertebral body. Compression deformity involving the T11 vertebral body is new since 2012. Multilevel disc disease. Grade 1 anterolisthesis  at L3-L4.  IMPRESSION: Small bowel obstruction secondary to an incarcerated left obturator hernia. Fluid surrounding the herniated bowel loop. Small amount of fluid and edema in the left lower abdomen. In addition, there is a small left femoral hernia.  Bilateral airspace disease in the lower lungs compatible with bilateral pneumonia and/or aspiration.  The nasogastric tube is kinked in the proximal stomach and the tip is pointing cephalad near the GE junction. Recommend repositioning of the nasogastric tube.  Chronic biliary dilatation.  Bilateral kidney cysts.  Compression fractures as described. T11 compression fracture is new since 2012.  These results were called by telephone at the time of interpretation on 04/05/2015 at 4:12 pm to [facility] , who verbally acknowledged these results.   Electronically Signed   By: Richarda Overlie M.D.   On: 04/05/2015 16:14   Portable Chest 1 View  04/06/2015   CLINICAL DATA:  Endotracheal tube placement. Intractable nausea and vomiting.  EXAM: PORTABLE CHEST - 1 VIEW  COMPARISON:  Chest radiograph April 05, 2015  FINDINGS: Interval intubation, distal tip projects 4 cm above the carina. Nasogastric tube tip and side port projects in proximal stomach.  Stable cardiomegaly. Calcified aortic knob. Diffuse interstitial prominence, patchy lower lobe airspace opacities, increased on the RIGHT. No pleural effusion. No pneumothorax. RIGHT apical pleural thickening. Patient is osteopenic.  IMPRESSION: ET tube tip projects 4 cm above the carina. Nasogastric tube tip projects in proximal stomach.  Interstitial prominence, increasing bibasilar consolidation concerning for bronchopneumonia.  Stable cardiomegaly.   Electronically Signed   By: Awilda Metro M.D.   On: 04/06/2015 03:48   Dg Abd Acute W/chest  04/05/2015   CLINICAL DATA:  Nausea and vomiting for 3-4 days. Recent fall. Abdominal discomfort and distention.  EXAM: DG ABDOMEN ACUTE W/ 1V CHEST  COMPARISON:  04/03/2015  and 02/21/2015  FINDINGS: New densities in the left lower chest are concerning for airspace disease and pneumonia. Heart size is upper limits normal and stable. Atherosclerotic calcifications at the aortic arch. Scoliosis of the thoracolumbar spine. Dilated gas-filled loops of bowel in the lower abdomen. These appear to represent dilated small bowel loops, largest measuring up to 4.1 cm. No evidence for free air on the decubitus image. Surgical changes in the pelvis.  IMPRESSION: Dilated small bowel loops in the abdomen. Findings are concerning for a small bowel obstruction. No evidence for free air.  Concern for new airspace densities in the left lung which could represent an area of pneumonia or aspiration.   Electronically Signed   By: Richarda Overlie M.D.   On: 04/05/2015 12:31    Anti-infectives: Anti-infectives    Start     Dose/Rate Route Frequency Ordered Stop   04/06/15 0000  piperacillin-tazobactam (ZOSYN) IVPB 3.375 g     3.375 g 12.5 mL/hr over 240 Minutes Intravenous Every 8 hours 04/05/15 1809     04/05/15 2200  piperacillin-tazobactam (ZOSYN) IVPB 3.375 g  Status:  Discontinued     3.375 g 100 mL/hr over 30 Minutes Intravenous 3 times per day 04/05/15 1802 04/05/15 1805   04/05/15 1630  piperacillin-tazobactam (ZOSYN) IVPB 3.375 g  3.375 g 100 mL/hr over 30 Minutes Intravenous  Once 04/05/15 1623 04/05/15 1754      Assessment/Plan: s/p Procedure(s):  EXPLORATORY LAPAROTOMY, SMALL BOWEL RESECTION, REPAIR OF INCARCERATED HERNIA Obturator hernia Tachycardia- A-fib Probable aspiration PNA, on Zosyn, vent management per CCM No acute surgical issues.  Continue Ng and bowel rest Malnourished pre op, may benefit from TNA   LOS: 1 day    Treyven Lafauci T 04/06/2015

## 2015-04-06 NOTE — Progress Notes (Signed)
RT attempted to obtain sputum sample from patient and was not able to obtain sample. MD notified.

## 2015-04-07 ENCOUNTER — Inpatient Hospital Stay (HOSPITAL_COMMUNITY): Payer: Medicare Other

## 2015-04-07 LAB — COMPREHENSIVE METABOLIC PANEL
ALT: 65 U/L — ABNORMAL HIGH (ref 14–54)
ANION GAP: 5 (ref 5–15)
AST: 68 U/L — ABNORMAL HIGH (ref 15–41)
Albumin: 2.7 g/dL — ABNORMAL LOW (ref 3.5–5.0)
Alkaline Phosphatase: 75 U/L (ref 38–126)
BILIRUBIN TOTAL: 4 mg/dL — AB (ref 0.3–1.2)
BUN: 26 mg/dL — ABNORMAL HIGH (ref 6–20)
CHLORIDE: 111 mmol/L (ref 101–111)
CO2: 23 mmol/L (ref 22–32)
Calcium: 7.9 mg/dL — ABNORMAL LOW (ref 8.9–10.3)
Creatinine, Ser: 0.44 mg/dL (ref 0.44–1.00)
Glucose, Bld: 97 mg/dL (ref 65–99)
POTASSIUM: 3.4 mmol/L — AB (ref 3.5–5.1)
Sodium: 139 mmol/L (ref 135–145)
TOTAL PROTEIN: 5.1 g/dL — AB (ref 6.5–8.1)

## 2015-04-07 LAB — OSMOLALITY: OSMOLALITY: 296 mosm/kg (ref 275–300)

## 2015-04-07 LAB — CBC WITH DIFFERENTIAL/PLATELET
BASOS ABS: 0 10*3/uL (ref 0.0–0.1)
Basophils Relative: 0 % (ref 0–1)
EOS PCT: 1 % (ref 0–5)
Eosinophils Absolute: 0.1 10*3/uL (ref 0.0–0.7)
HEMATOCRIT: 29.2 % — AB (ref 36.0–46.0)
Hemoglobin: 9.3 g/dL — ABNORMAL LOW (ref 12.0–15.0)
LYMPHS ABS: 0.3 10*3/uL — AB (ref 0.7–4.0)
Lymphocytes Relative: 5 % — ABNORMAL LOW (ref 12–46)
MCH: 31.1 pg (ref 26.0–34.0)
MCHC: 31.8 g/dL (ref 30.0–36.0)
MCV: 97.7 fL (ref 78.0–100.0)
MONOS PCT: 6 % (ref 3–12)
Monocytes Absolute: 0.3 10*3/uL (ref 0.1–1.0)
NEUTROS PCT: 88 % — AB (ref 43–77)
Neutro Abs: 4.4 10*3/uL (ref 1.7–7.7)
PLATELETS: 155 10*3/uL (ref 150–400)
RBC: 2.99 MIL/uL — AB (ref 3.87–5.11)
RDW: 14.9 % (ref 11.5–15.5)
WBC: 5.1 10*3/uL (ref 4.0–10.5)

## 2015-04-07 LAB — GLUCOSE, CAPILLARY
GLUCOSE-CAPILLARY: 88 mg/dL (ref 65–99)
GLUCOSE-CAPILLARY: 83 mg/dL (ref 65–99)
Glucose-Capillary: 86 mg/dL (ref 65–99)

## 2015-04-07 LAB — APTT: APTT: 31 s (ref 24–37)

## 2015-04-07 LAB — PROTIME-INR
INR: 1.12 (ref 0.00–1.49)
PROTHROMBIN TIME: 14.5 s (ref 11.6–15.2)

## 2015-04-07 MED ORDER — HALOPERIDOL LACTATE 5 MG/ML IJ SOLN
INTRAMUSCULAR | Status: AC
  Administered 2015-04-07: 0.5 mg
  Filled 2015-04-07: qty 1

## 2015-04-07 MED ORDER — HALOPERIDOL LACTATE 5 MG/ML IJ SOLN
0.5000 mg | INTRAMUSCULAR | Status: DC | PRN
  Administered 2015-04-07 – 2015-04-10 (×4): 0.5 mg via INTRAVENOUS
  Filled 2015-04-07 (×4): qty 1

## 2015-04-07 MED ORDER — FENTANYL CITRATE (PF) 100 MCG/2ML IJ SOLN
12.5000 ug | INTRAMUSCULAR | Status: DC | PRN
  Administered 2015-04-07 – 2015-04-09 (×10): 25 ug via INTRAVENOUS
  Administered 2015-04-10: 12.5 ug via INTRAVENOUS
  Administered 2015-04-11 – 2015-04-13 (×7): 25 ug via INTRAVENOUS
  Administered 2015-04-13 – 2015-04-14 (×2): 12.5 ug via INTRAVENOUS
  Filled 2015-04-07 (×21): qty 2

## 2015-04-07 MED ORDER — POTASSIUM CHLORIDE 10 MEQ/100ML IV SOLN
10.0000 meq | INTRAVENOUS | Status: AC
  Administered 2015-04-07 (×2): 10 meq via INTRAVENOUS
  Filled 2015-04-07 (×2): qty 100

## 2015-04-07 NOTE — Progress Notes (Signed)
Patient ID: Emily Wyatt, female   DOB: 23-Mar-1927, 81 y.o.   MRN: [id number] Patient ID: Emily Wyatt, female   DOB: 09-22-1926, 81 y.o.   MRN: [id number] 2 Days Post-Op  Subjective: Sedated on vent and not responsive  Objective: Vital signs in last 24 hours: Temp:  [97.5 F (36.4 C)-98.8 F (37.1 C)] 98.7 F (37.1 C) (09/04 0400) Pulse Rate:  [42-143] 75 (09/04 0700) Resp:  [14-23] 17 (09/04 0700) BP: (106-150)/(36-77) 113/59 mmHg (09/04 0700) SpO2:  [95 %-99 %] 97 % (09/04 0700) FiO2 (%):  [30 %] 30 % (09/04 0400) Weight:  [51 kg (112 lb 7 oz)] 51 kg (112 lb 7 oz) (09/04 0400) Last BM Date:  (PTA)  Intake/Output from previous day: 09/03 0701 - 09/04 0700 In: 4199.4 [I.V.:3191.9; NG/GT:80; IV Piggyback:787.5] Out: 830 [Urine:710; Emesis/NG output:120] Intake/Output this shift:    General appearance: Very thin, sedated on vent, comfortable Resp: clear to auscultation bilaterally GI: Soft without apparent tenderness, mild distention Incision/Wound: Dressing clean and dry  Lab Results:   Recent Labs  04/06/15 0546 04/07/15 0356  WBC 4.3 5.1  HGB 9.9* 9.3*  HCT 30.9* 29.2*  PLT 157 155   BMET  Recent Labs  04/06/15 0546 04/07/15 0356  NA 133* 139  K 3.1* 3.4*  CL 103 111  CO2 21* 23  GLUCOSE 115* 97  BUN 27* 26*  CREATININE 0.50 0.44  CALCIUM 7.8* 7.9*     Studies/Results: Ct Abdomen Pelvis W Contrast  04/05/2015   CLINICAL DATA:  Vomiting for 2 days.  EXAM: CT ABDOMEN AND PELVIS WITH CONTRAST  TECHNIQUE: Multidetector CT imaging of the abdomen and pelvis was performed using the standard protocol following bolus administration of intravenous contrast.  CONTRAST:  80mL OMNIPAQUE IOHEXOL 300 MG/ML  SOLN  COMPARISON:  Abdominal series 04/05/2015.  CT 07/18/2011  FINDINGS: There is patchy bilateral airspace disease in the lower lungs. Findings are suggestive for pneumonia or aspiration. No large pleural effusions. There is enlargement of the heart, particularly  the right atrium. Nasogastric tube extends into the proximal stomach but kinks back and terminates near the GE junction. The examination has limitations due to motion artifact. There is no evidence for free intraperitoneal air.  There is chronic dilatation of the intrahepatic and extrahepatic biliary system and post cholecystectomy. Extrahepatic bile duct measures up to 2.2 cm and similar to the prior examination. Chronic dilatation of the main pancreatic duct measuring 0.5 cm. Portal venous system is patent.  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 No gross abnormality to the adrenal tissue. Probable cyst in left kidney upper pole. No acute abnormality involving the kidneys. Again noted are prominent exophytic cysts involving the right kidney.  Despite having having the nasogastric tube, there continues to be a fluid-filled stomach. Again noted are bowel structures anterior to the gastric antrum.  There are dilated loops of small bowel in the abdomen containing fluid and gas. Small bowel loops measure up to 3.8 cm. Evidence for fluid or edema in the left lower abdomen on sequence 2, image 52. Large amount of fluid in the pelvis probably represents a distended urinary bladder. There is a left obturator hernia containing a loop of bowel with surrounding fluid. This appears to be the cause for the small bowel obstruction. Findings are compatible with an incarcerated hernia. In addition, there may be a small left femoral hernia. Evidence of previous right inguinal hernia repair. Evidence for rectocele  along the left side the perineum. Uterus is absent. There are decompressed loops of small bowel in the right abdomen.  Probable large Tarlov cysts in the sacrum. Old compression fracture involving T12 vertebral body. Compression deformity involving the T11 vertebral body is new since 2012. Multilevel disc disease. Grade 1 anterolisthesis at L3-L4.  IMPRESSION: Small bowel  obstruction secondary to an incarcerated left obturator hernia. Fluid surrounding the herniated bowel loop. Small amount of fluid and edema in the left lower abdomen. In addition, there is a small left femoral hernia.  Bilateral airspace disease in the lower lungs compatible with bilateral pneumonia and/or aspiration.  The nasogastric tube is kinked in the proximal stomach and the tip is pointing cephalad near the GE junction. Recommend repositioning of the nasogastric tube.  Chronic biliary dilatation.  Bilateral kidney cysts.  Compression fractures as described. T11 compression fracture is new since 2012.  These results were called by telephone at the time of interpretation on 04/05/2015 at 4:12 pm to [place] , who verbally acknowledged these results.   Electronically Signed   By: Richarda Overlie M.D.   On: 04/05/2015 16:14   Portable Chest 1 View  04/06/2015   CLINICAL DATA:  Endotracheal tube placement. Intractable nausea and vomiting.  EXAM: PORTABLE CHEST - 1 VIEW  COMPARISON:  Chest radiograph April 05, 2015  FINDINGS: Interval intubation, distal tip projects 4 cm above the carina. Nasogastric tube tip and side port projects in proximal stomach.  Stable cardiomegaly. Calcified aortic knob. Diffuse interstitial prominence, patchy lower lobe airspace opacities, increased on the RIGHT. No pleural effusion. No pneumothorax. RIGHT apical pleural thickening. Patient is osteopenic.  IMPRESSION: ET tube tip projects 4 cm above the carina. Nasogastric tube tip projects in proximal stomach.  Interstitial prominence, increasing bibasilar consolidation concerning for bronchopneumonia.  Stable cardiomegaly.   Electronically Signed   By: Awilda Metro M.D.   On: 04/06/2015 03:48   Dg Abd Acute W/chest  04/05/2015   CLINICAL DATA:  Nausea and vomiting for 3-4 days. Recent fall. Abdominal discomfort and distention.  EXAM: DG ABDOMEN ACUTE W/ 1V CHEST  COMPARISON:  04/03/2015 and 02/21/2015  FINDINGS: New  densities in the left lower chest are concerning for airspace disease and pneumonia. Heart size is upper limits normal and stable. Atherosclerotic calcifications at the aortic arch. Scoliosis of the thoracolumbar spine. Dilated gas-filled loops of bowel in the lower abdomen. These appear to represent dilated small bowel loops, largest measuring up to 4.1 cm. No evidence for free air on the decubitus image. Surgical changes in the pelvis.  IMPRESSION: Dilated small bowel loops in the abdomen. Findings are concerning for a small bowel obstruction. No evidence for free air.  Concern for new airspace densities in the left lung which could represent an area of pneumonia or aspiration.   Electronically Signed   By: Richarda Overlie M.D.   On: 04/05/2015 12:31    Anti-infectives: Anti-infectives    Start     Dose/Rate Route Frequency Ordered Stop   04/06/15 0000  piperacillin-tazobactam (ZOSYN) IVPB 3.375 g     3.375 g 12.5 mL/hr over 240 Minutes Intravenous Every 8 hours 04/05/15 1809     04/05/15 2200  piperacillin-tazobactam (ZOSYN) IVPB 3.375 g  Status:  Discontinued     3.375 g 100 mL/hr over 30 Minutes Intravenous 3 times per day 04/05/15 1802 04/05/15 1805   04/05/15 1630  piperacillin-tazobactam (ZOSYN) IVPB 3.375 g     3.375 g 100 mL/hr over 30 Minutes Intravenous  Once 04/05/15 1623 04/05/15 1754      Assessment/Plan: s/p Procedure(s):  EXPLORATORY LAPAROTOMY, SMALL BOWEL RESECTION, REPAIR OF INCARCERATED HERNIA Obturator hernia A-fib, rate controlled Probable aspiration PNA, on Zosyn, vent management per CCM No acute surgical issues.  Continue Ng and bowel rest Malnourished pre op, may benefit from TNA   LOS: 2 days    Ever Gustafson T 04/07/2015

## 2015-04-07 NOTE — Progress Notes (Signed)
[facility] ADULT ICU REPLACEMENT PROTOCOL FOR AM LAB REPLACEMENT ONLY  The patient does apply for the [facility] Adult ICU Electrolyte Replacment Protocol based on the criteria listed below:   1. Is GFR >/= 40 ml/min? Yes.    Patient's GFR today is >60 2. Is urine output >/= 0.5 ml/kg/hr for the last 6 hours? Yes.   Patient's UOP is 0.8 ml/kg/hr 3. Is BUN < 60 mg/dL? Yes.    Patient's BUN today is 26 4. Abnormal electrolyte(s): K+3.4 5. Ordered repletion with: protocol 6. If a panic level lab has been reported, has the CCM MD in charge been notified? Yes.  .   Physician:  Tandy Gaw Hilliard 04/07/2015 4:56 AM

## 2015-04-07 NOTE — Progress Notes (Signed)
Discarded 120 cc of Fentanyl drip after patient was extubated.  Waste was witnessed per Lorrin Jackson RN and Lezlie Lye RN.  Grace Haggart Debroah Loop RN

## 2015-04-07 NOTE — Consult Note (Signed)
PULMONARY / CRITICAL CARE MEDICINE   Name: Emily Wyatt MRN: [id number] DOB: 1926-08-26    ADMISSION DATE:  04/05/2015 CONSULTATION DATE:  04/05/2015  REFERRING MD :  Dr. Susie Cassette  CHIEF COMPLAINT:  Nausea vomiting  INITIAL PRESENTATION: ischemic bowel to the OR  SIGNIFICANT EVENTS: Ex lap today  9/3 vented  SUBJECTIVE:  Remains deep rass Cardizem drip off as mild brady   VITAL SIGNS: Temp:  [97.5 F (36.4 C)-98.8 F (37.1 C)] 98.7 F (37.1 C) (09/04 0400) Pulse Rate:  [42-143] 75 (09/04 0700) Resp:  [14-23] 17 (09/04 0700) BP: (106-150)/(36-77) 113/59 mmHg (09/04 0700) SpO2:  [95 %-99 %] 97 % (09/04 0700) FiO2 (%):  [30 %] 30 % (09/04 0400) Weight:  [51 kg (112 lb 7 oz)] 51 kg (112 lb 7 oz) (09/04 0400) HEMODYNAMICS:   VENTILATOR SETTINGS: Vent Mode:  [-] PRVC FiO2 (%):  [30 %] 30 % Set Rate:  [14 bmp] 14 bmp Vt Set:  [430 mL-480 mL] 430 mL PEEP:  [5 cmH20] 5 cmH20 Plateau Pressure:  [14 cmH20-17 cmH20] 14 cmH20 INTAKE / OUTPUT:  Intake/Output Summary (Last 24 hours) at 04/07/15 0759 Last data filed at 04/07/15 4098  Gross per 24 hour  Intake 4186.88 ml  Output    830 ml  Net 3356.88 ml    PHYSICAL EXAMINATION: General:  Intubated and residual sedated rass -3 Neuro:  rass -3, perr, coughs HEENT:   ETT in place   Cardiovascular: s1 s2  Irreg Rate/rhythm Lungs:  CTA  Abdomen:  Soft, dressing in place / dry. No bowel sounds   LABS:  CBC  Recent Labs Lab 04/05/15 1157 04/05/15 1231 04/06/15 0546 04/07/15 0356  WBC 8.1  --  4.3 5.1  HGB 12.1 13.9 9.9* 9.3*  HCT 36.5 41.0 30.9* 29.2*  PLT 238  --  157 155   Coag's No results for input(s): APTT, INR in the last 168 hours. BMET  Recent Labs Lab 04/05/15 1157 04/05/15 1231 04/06/15 0546 04/07/15 0356  NA 134* 131* 133* 139  K 4.2 4.7 3.1* 3.4*  CL 96* 96* 103 111  CO2 26  --  21* 23  BUN 39* 52* 27* 26*  CREATININE 0.89 0.90 0.50 0.44  GLUCOSE 141* 137* 115* 97   Electrolytes  Recent  Labs Lab 04/05/15 1157 04/05/15 2330 04/06/15 0546 04/07/15 0356  CALCIUM 9.6  --  7.8* 7.9*  MG  --  1.5*  --   --    Sepsis Markers  Recent Labs Lab 04/05/15 1210 04/05/15 1646  LATICACIDVEN 1.71 2.43*   ABG  Recent Labs Lab 04/05/15 2300  PHART 7.431  PCO2ART 35.4  PO2ART 411*   Liver Enzymes  Recent Labs Lab 04/05/15 1157 04/06/15 0546 04/07/15 0356  AST 103* 55* 68*  ALT 94* 59* 65*  ALKPHOS 95 62 75  BILITOT 1.5* 1.8* 4.0*  ALBUMIN 4.4 2.9* 2.7*   Cardiac Enzymes  Recent Labs Lab 04/05/15 1824 04/05/15 2330 04/06/15 0546  TROPONINI 0.03 0.05* 0.04*   Glucose  Recent Labs Lab 04/06/15 2013 04/07/15 0049 04/07/15 0419  GLUCAP 93 86 88    Imaging No results found.   ASSESSMENT / PLAN:  PULMONARY  A:  ARF Afib mild RVR - brady on Cardizem drip 9/3 aspiration pneumonits/ PNA Hypokalemia LActic Hyponatremia Prior hypomagnesemia improved Malnutrition Delirium, encephalopathy  P:   Rate controlled on own now, follow, may need restart of Cardizem infusion with lower max Weaning now cpap 5 ps 5, goal 2  hr, need still her neurostatus to resolve in order to extubate Some clearing pcxr left base, follow again in am  supp k recheck in am  Hyponatremia improved with saline infusion, maintain but reduce rate, follow cl further Attempted to get sputum, unable Maintain zosyn, add vanc if spike temp Continue to avoiding al sedation, if remains with poor neuro , may need CT Hold further dig i see her pre icu nutritional status is poor, typically with small bowel resection could consider feeds trickle soon day 2-3, may in fact need TPN, would like to avoid this with Small bowel and possible trickle Would likely narrow off zosyn in am iuf remains culture neg Get coags, as may need line  FAMILY  - Updates: no family   Total critical care time: 30 min  Mcarthur Rossetti. Tyson Alias, MD, FACP Pgr: [phone] [facility]

## 2015-04-08 ENCOUNTER — Inpatient Hospital Stay (HOSPITAL_COMMUNITY): Payer: Medicare Other

## 2015-04-08 LAB — CBC WITH DIFFERENTIAL/PLATELET
BASOS ABS: 0 10*3/uL (ref 0.0–0.1)
Basophils Relative: 0 % (ref 0–1)
Eosinophils Absolute: 0.1 10*3/uL (ref 0.0–0.7)
Eosinophils Relative: 1 % (ref 0–5)
HEMATOCRIT: 30.6 % — AB (ref 36.0–46.0)
Hemoglobin: 9.8 g/dL — ABNORMAL LOW (ref 12.0–15.0)
LYMPHS PCT: 8 % — AB (ref 12–46)
Lymphs Abs: 0.5 10*3/uL — ABNORMAL LOW (ref 0.7–4.0)
MCH: 31 pg (ref 26.0–34.0)
MCHC: 32 g/dL (ref 30.0–36.0)
MCV: 96.8 fL (ref 78.0–100.0)
MONO ABS: 0.6 10*3/uL (ref 0.1–1.0)
Monocytes Relative: 9 % (ref 3–12)
NEUTROS ABS: 5.4 10*3/uL (ref 1.7–7.7)
Neutrophils Relative %: 82 % — ABNORMAL HIGH (ref 43–77)
Platelets: 185 10*3/uL (ref 150–400)
RBC: 3.16 MIL/uL — AB (ref 3.87–5.11)
RDW: 14.4 % (ref 11.5–15.5)
WBC: 6.6 10*3/uL (ref 4.0–10.5)

## 2015-04-08 LAB — COMPREHENSIVE METABOLIC PANEL
ALBUMIN: 2.9 g/dL — AB (ref 3.5–5.0)
ALT: 60 U/L — ABNORMAL HIGH (ref 14–54)
ANION GAP: 10 (ref 5–15)
AST: 44 U/L — ABNORMAL HIGH (ref 15–41)
Alkaline Phosphatase: 83 U/L (ref 38–126)
BILIRUBIN TOTAL: 1.7 mg/dL — AB (ref 0.3–1.2)
BUN: 16 mg/dL (ref 6–20)
CO2: 24 mmol/L (ref 22–32)
Calcium: 8.3 mg/dL — ABNORMAL LOW (ref 8.9–10.3)
Chloride: 105 mmol/L (ref 101–111)
Creatinine, Ser: 0.45 mg/dL (ref 0.44–1.00)
GFR calc Af Amer: >60 mL/min (ref >60)
GFR calc non Af Amer: >60 mL/min (ref >60)
GLUCOSE: 91 mg/dL (ref 65–99)
POTASSIUM: 3 mmol/L — AB (ref 3.5–5.1)
SODIUM: 139 mmol/L (ref 135–145)
TOTAL PROTEIN: 5.8 g/dL — AB (ref 6.5–8.1)

## 2015-04-08 LAB — BLOOD GAS, ARTERIAL
Acid-base deficit: 1.3 mmol/L (ref 0.0–2.0)
Bicarbonate: 21.5 mEq/L (ref 20.0–24.0)
DRAWN BY: 232811
FIO2: 0.21
O2 SAT: 94 %
Patient temperature: 99.3
TCO2: 19.9 mmol/L (ref 0–100)
pCO2 arterial: 31.3 mmHg — ABNORMAL LOW (ref 35.0–45.0)
pH, Arterial: 7.454 — ABNORMAL HIGH (ref 7.350–7.450)
pO2, Arterial: 70.6 mmHg — ABNORMAL LOW (ref 80.0–100.0)

## 2015-04-08 MED ORDER — POTASSIUM CHLORIDE IN NACL 20-0.9 MEQ/L-% IV SOLN
INTRAVENOUS | Status: DC
  Administered 2015-04-08: 09:00:00 via INTRAVENOUS
  Administered 2015-04-08: 75 mL/h via INTRAVENOUS
  Administered 2015-04-09: 13:00:00 via INTRAVENOUS
  Filled 2015-04-08 (×3): qty 1000

## 2015-04-08 MED ORDER — DEXTROSE 5 % IV SOLN
1.0000 g | INTRAVENOUS | Status: DC
  Administered 2015-04-08 – 2015-04-12 (×5): 1 g via INTRAVENOUS
  Filled 2015-04-08 (×5): qty 10

## 2015-04-08 MED ORDER — POTASSIUM CHLORIDE 10 MEQ/100ML IV SOLN
10.0000 meq | INTRAVENOUS | Status: AC
  Administered 2015-04-08 (×4): 10 meq via INTRAVENOUS
  Filled 2015-04-08 (×4): qty 100

## 2015-04-08 MED ORDER — POTASSIUM CHLORIDE 10 MEQ/100ML IV SOLN
10.0000 meq | INTRAVENOUS | Status: DC
  Filled 2015-04-08: qty 100

## 2015-04-08 MED ORDER — LABETALOL HCL 5 MG/ML IV SOLN
10.0000 mg | INTRAVENOUS | Status: DC | PRN
  Administered 2015-04-08 (×4): 10 mg via INTRAVENOUS
  Filled 2015-04-08 (×4): qty 4

## 2015-04-08 NOTE — Evaluation (Signed)
Physical Therapy Evaluation Patient Details Name: Emily Wyatt MRN: [id number] DOB: 1927-04-17 Today's Date: 04/08/2015   History of Present Illness  81 year old female with a history of atrial fibrillation, not an anticoagulation because of life-threatening gastro-intestinal bleeding within the last 2 years, taken off of Coumadin, COPD, admitted due for fecal emesis for the last 24 hourss/p EXPLORATORY LAPAROTOMY, SMALL BOWEL RESECTION, REPAIR OF INCARCERATED HERNIA on 04/05/15 and extubated 9/4  Clinical Impression  Pt admitted with above diagnosis. Pt currently with functional limitations due to the deficits listed below (see PT Problem List).  Pt will benefit from skilled PT to increase their independence and safety with mobility to allow discharge to the venue listed below.  Pt with confusion today and decreased mobility, limited endurance, and generalized weakness at this time.  Pt would benefit from ST -SNF upon d/c.  RN in room upon pt sitting EOB and assisted PT with returning pt to supine and repositioning.     Follow Up Recommendations SNF;Supervision/Assistance - 24 hour    Equipment Recommendations  None recommended by PT    Recommendations for Other Services       Precautions / Restrictions Precautions Precautions: Fall Precaution Comments: NG tube, mittens      Mobility  Bed Mobility Overal bed mobility: Needs Assistance;+2 for physical assistance Bed Mobility: Supine to Sit;Sit to Supine     Supine to sit: Total assist;+2 for physical assistance;HOB elevated Sit to supine: Total assist;+2 for physical assistance   General bed mobility comments: pt given multimodal cues however unable to assist, RN assisted with Sitting EOB, return to supine and repositioning in bed  Transfers Overall transfer level:  (NT due to assist level and confusion)                  Ambulation/Gait                Stairs            Wheelchair Mobility    Modified  Rankin (Stroke Patients Only)       Balance Overall balance assessment: Needs assistance Sitting-balance support: No upper extremity supported;Feet supported Sitting balance-Leahy Scale: Poor Sitting balance - Comments: requiring assist for trunk, pt wearing mittens and not following commands well                                     Pertinent Vitals/Pain Pain Assessment: Faces Faces Pain Scale: Hurts even more Pain Descriptors / Indicators: Grimacing;Restless Pain Intervention(s): Monitored during session;Limited activity within patient's tolerance;Other (comment);Repositioned (RN in room for mobility)    Home Living Family/patient expects to be discharged to:: Private residence Living Arrangements: Alone             Home Equipment: Dan Humphreys - 2 wheels;Cane - single point      Prior Function Level of Independence: Independent with assistive device(s)               Hand Dominance        Extremity/Trunk Assessment   Upper Extremity Assessment: Generalized weakness           Lower Extremity Assessment: Generalized weakness         Communication   Communication: HOH;Other (comment) (typically wears hearing aides however daughter states she left them home)  Cognition   Behavior During Therapy: Restless Overall Cognitive Status: Impaired/Different from baseline Area of Impairment: Orientation;Following commands Orientation Level: Disoriented to;Place;Time;Situation  Following Commands: Follows one step commands inconsistently       General Comments: per daughter and RN, pt more confused today, also difficult to tell due to [facility] and without hearing aides    General Comments      Exercises        Assessment/Plan    PT Assessment Patient needs continued PT services  PT Diagnosis Difficulty walking;Generalized weakness;Altered mental status   PT Problem List Decreased strength;Decreased activity tolerance;Decreased  balance;Decreased mobility;Decreased safety awareness;Decreased knowledge of precautions;Decreased knowledge of use of DME;Decreased cognition;Pain  PT Treatment Interventions DME instruction;Gait training;Functional mobility training;Patient/family education;Therapeutic activities;Therapeutic exercise;Balance training   PT Goals (Current goals can be found in the Care Plan section) Acute Rehab PT Goals PT Goal Formulation: With family Time For Goal Achievement: 04/22/15 Potential to Achieve Goals: Fair    Frequency Min 3X/week   Barriers to discharge        Co-evaluation               End of Session Equipment Utilized During Treatment: Gait belt Activity Tolerance: Patient limited by fatigue Patient left: in bed;with family/visitor present;with nursing/sitter in room;with bed alarm set           Time: 1445-1505 PT Time Calculation (min) (ACUTE ONLY): 20 min   Charges:   PT Evaluation $Initial PT Evaluation Tier I: 1 Procedure     PT G Codes:        [name],[name] 04/08/2015, 3:26 PM Zenovia Jarred, PT, DPT 04/08/2015 Pager: [phone]

## 2015-04-08 NOTE — Progress Notes (Signed)
Patient ID: Emily Wyatt, female   DOB: 1927-03-28, 81 y.o.   MRN: [id number] 3 Days Post-Op  Subjective: Extubated, conversant, says she is doing better than she was  Objective: Vital signs in last 24 hours: Temp:  [98.2 F (36.8 C)-99.3 F (37.4 C)] 99.3 F (37.4 C) (09/05 0400) Pulse Rate:  [53-101] 93 (09/05 0600) Resp:  [16-30] 25 (09/05 0700) BP: (131-177)/(48-137) 175/106 mmHg (09/05 0700) SpO2:  [96 %-100 %] 100 % (09/05 0600) FiO2 (%):  [30 %] 30 % (09/04 0806) Last BM Date:  (PTA)  Intake/Output from previous day: 09/04 0701 - 09/05 0700 In: 2228 [I.V.:1703; NG/GT:20; IV Piggyback:325] Out: 1705 [Urine:1705] Intake/Output this shift:    General appearance: alert and responsive and verbal but confused Resp: No wheezing or increased WOB GI: normal findings: soft, non-tender and mildly distended Incision/Wound: Clean and dry without infection  Lab Results:   Recent Labs  04/07/15 0356 04/08/15 0348  WBC 5.1 6.6  HGB 9.3* 9.8*  HCT 29.2* 30.6*  PLT 155 185   BMET  Recent Labs  04/07/15 0356 04/08/15 0348  NA 139 139  K 3.4* 3.0*  CL 111 105  CO2 23 24  GLUCOSE 97 91  BUN 26* 16  CREATININE 0.44 0.45  CALCIUM 7.9* 8.3*     Studies/Results: Dg Chest Port 1 View  04/07/2015   CLINICAL DATA:  Acute respiratory failure.  Ex-smoker.  EXAM: PORTABLE CHEST - 1 VIEW  COMPARISON:  Yesterday.  FINDINGS: Stable enlarged cardiac silhouette. Endotracheal tube in satisfactory position. Nasogastric tube extending into the stomach. No significant change in right basilar airspace opacity. Mildly improved left basilar airspace opacity. Stable prominence of the interstitial markings, diffuse osteopenia and bilateral shoulder degenerative changes. Stable thoracolumbar vertebral compression deformity.  IMPRESSION: 1. Mildly increased right basilar probable pneumonia. 2. Mildly improved left basilar atelectasis and possible pneumonia. 3. Stable cardiomegaly and chronic  interstitial lung disease.   Electronically Signed   By: Beckie Salts M.D.   On: 04/07/2015 09:24   Dg Abd Portable 1v  04/08/2015   CLINICAL DATA:  NG tube placement  EXAM: PORTABLE ABDOMEN - 1 VIEW  COMPARISON:  04/05/2015  FINDINGS: An enteric tube is been placed. The tip is just below the left hemidiaphragm consistent with location in the upper stomach. Proximal side hole is projected over the lower chest, likely in the distal esophagus. Consider advancing the tube 4-5 cm for better placement within the stomach.  Interval postoperative changes with skin clips across the midline. Residual gas and stool in the colon and small bowel with decreased bowel distention since previous study. Probable bronchiectasis and scarring in the lung bases. Degenerative changes in the spine.  IMPRESSION: Enteric tube tip appears to be just below the EG junction with proximal side hole likely above the EG junction in the lower esophagus. Consider advancing the tube for 4-5 cm for better placement.   Electronically Signed   By: Burman Nieves M.D.   On: 04/08/2015 02:10    Anti-infectives: Anti-infectives    Start     Dose/Rate Route Frequency Ordered Stop   04/06/15 0000  piperacillin-tazobactam (ZOSYN) IVPB 3.375 g     3.375 g 12.5 mL/hr over 240 Minutes Intravenous Every 8 hours 04/05/15 1809     04/05/15 2200  piperacillin-tazobactam (ZOSYN) IVPB 3.375 g  Status:  Discontinued     3.375 g 100 mL/hr over 30 Minutes Intravenous 3 times per day 04/05/15 1802 04/05/15 1805   04/05/15 1630  piperacillin-tazobactam (ZOSYN) IVPB 3.375 g     3.375 g 100 mL/hr over 30 Minutes Intravenous  Once 04/05/15 1623 04/05/15 1754      Assessment/Plan: s/p Procedure(s): EXPLORATORY LAPAROTOMY, SMALL BOWEL RESECTION, REPAIR OF INCARCERATED HERNIA  Extubated and stable Expected ileus Cont NG for now.  OOB    LOS: 3 days    Keria Widrig T 04/08/2015

## 2015-04-08 NOTE — Consult Note (Signed)
PULMONARY / CRITICAL CARE MEDICINE   Name: Emily Wyatt MRN: [id number] DOB: 27-Jan-1927    ADMISSION DATE:  04/05/2015 CONSULTATION DATE:  04/05/2015  REFERRING MD :  Dr. Susie Cassette  CHIEF COMPLAINT:  Nausea vomiting  INITIAL PRESENTATION: ischemic bowel to the OR  SIGNIFICANT EVENTS: Ex lap today  9/3 vented 9/4- extubated, some confusion  SUBJECTIVE:  Extubated, no distress  VITAL SIGNS: Temp:  [98.2 F (36.8 C)-99.3 F (37.4 C)] 99.3 F (37.4 C) (09/05 0400) Pulse Rate:  [53-101] 93 (09/05 0600) Resp:  [17-30] 25 (09/05 0700) BP: (131-177)/(48-137) 175/106 mmHg (09/05 0700) SpO2:  [96 %-100 %] 100 % (09/05 0600) HEMODYNAMICS:   VENTILATOR SETTINGS:   INTAKE / OUTPUT:  Intake/Output Summary (Last 24 hours) at 04/08/15 0807 Last data filed at 04/08/15 0600  Gross per 24 hour  Intake   2038 ml  Output   1645 ml  Net    393 ml    PHYSICAL EXAMINATION: General:extubated, no distress Neuro:  rass 0, perr, coughs well, alert HEENT:  jvd noted  Cardiovascular: s1 s2  Irreg Rate/rhythm Lungs:  CTA  Abdomen:  Soft, dressing in place / dry. Low BS   LABS:  CBC  Recent Labs Lab 04/06/15 0546 04/07/15 0356 04/08/15 0348  WBC 4.3 5.1 6.6  HGB 9.9* 9.3* 9.8*  HCT 30.9* 29.2* 30.6*  PLT 157 155 185   Coag's  Recent Labs Lab 04/07/15 0910  APTT 31  INR 1.12   BMET  Recent Labs Lab 04/06/15 0546 04/07/15 0356 04/08/15 0348  NA 133* 139 139  K 3.1* 3.4* 3.0*  CL 103 111 105  CO2 21* 23 24  BUN 27* 26* 16  CREATININE 0.50 0.44 0.45  GLUCOSE 115* 97 91   Electrolytes  Recent Labs Lab 04/05/15 2330 04/06/15 0546 04/07/15 0356 04/08/15 0348  CALCIUM  --  7.8* 7.9* 8.3*  MG 1.5*  --   --   --    Sepsis Markers  Recent Labs Lab 04/05/15 1210 04/05/15 1646  LATICACIDVEN 1.71 2.43*   ABG  Recent Labs Lab 04/05/15 2300 04/08/15 0430  PHART 7.431 7.454*  PCO2ART 35.4 31.3*  PO2ART 411* 70.6*   Liver Enzymes  Recent Labs Lab  04/06/15 0546 04/07/15 0356 04/08/15 0348  AST 55* 68* 44*  ALT 59* 65* 60*  ALKPHOS 62 75 83  BILITOT 1.8* 4.0* 1.7*  ALBUMIN 2.9* 2.7* 2.9*   Cardiac Enzymes  Recent Labs Lab 04/05/15 1824 04/05/15 2330 04/06/15 0546  TROPONINI 0.03 0.05* 0.04*   Glucose  Recent Labs Lab 04/06/15 2013 04/07/15 0049 04/07/15 0419 04/07/15 0748  GLUCAP 93 86 88 83    Imaging Dg Chest Port 1 View  04/08/2015   CLINICAL DATA:  81 year old female with confusion shortness of breath and weakness. Recent respiratory failure and intubation. Initial encounter.  EXAM: PORTABLE CHEST - 1 VIEW  COMPARISON:  04/07/2015 and earlier.  FINDINGS: Portable AP semi upright view at 0154 hours. Extubated. Enteric tube remains in place. Stable cardiomegaly and mediastinal contours. Calcified aortic atherosclerosis. Mildly lower lung volumes. Continued dense retrocardiac opacity. Bilateral veiling pulmonary opacity also appears stable. Stable pulmonary vascularity. No pneumothorax.  IMPRESSION: 1. Extubated.  Mildly lower lung volumes.  Stable enteric tube. 2. Otherwise stable ventilation with lower lobe collapse consolidation and possible small pleural effusions.   Electronically Signed   By: Odessa Fleming M.D.   On: 04/08/2015 07:51   Dg Abd Portable 1v  04/08/2015   CLINICAL DATA:  NG  tube placement  EXAM: PORTABLE ABDOMEN - 1 VIEW  COMPARISON:  04/05/2015  FINDINGS: An enteric tube is been placed. The tip is just below the left hemidiaphragm consistent with location in the upper stomach. Proximal side hole is projected over the lower chest, likely in the distal esophagus. Consider advancing the tube 4-5 cm for better placement within the stomach.  Interval postoperative changes with skin clips across the midline. Residual gas and stool in the colon and small bowel with decreased bowel distention since previous study. Probable bronchiectasis and scarring in the lung bases. Degenerative changes in the spine.  IMPRESSION:  Enteric tube tip appears to be just below the EG junction with proximal side hole likely above the EG junction in the lower esophagus. Consider advancing the tube for 4-5 cm for better placement.   Electronically Signed   By: Burman Nieves M.D.   On: 04/08/2015 02:10     ASSESSMENT / PLAN:  PULMONARY  A:  ARF - extubated 9/4 Afib mild RVR - brady on Cardizem drip 9/3 aspiration pneumonits/ PNA Hypokalemia LActic Hyponatremia Prior hypomagnesemia improved Malnutrition Delirium, encephalopathy  P:   Extubated doing well abg reviewed, no repeat needed supp K , recheck in am  HR 100-110 acceptable and some HTN, add IV labetalol, wil not use home short acting IV dilt NPO per surgery, if still NPO in am , will place line and start TPN given malnurished status Low dose prn haldol fent for pain Culture neg, wean off zosyn, narrow to ceftriaxone, will need treatment 7 days total including zosyn for asp pNA Chem in am   FAMILY  - Updates: no family   Mcarthur Rossetti. Tyson Alias, MD, FACP Pgr: [phone] [facility]

## 2015-04-08 NOTE — Progress Notes (Signed)
[facility] ADULT ICU REPLACEMENT PROTOCOL FOR AM LAB REPLACEMENT ONLY  The patient does apply for the [facility] Adult ICU Electrolyte Replacment Protocol based on the criteria listed below:   1. Is GFR >/= 40 ml/min? Yes.    Patient's GFR today is >60 2. Is urine output >/= 0.5 ml/kg/hr for the last 6 hours? Yes.   Patient's UOP is 1.1 ml/kg/hr 3. Is BUN < 60 mg/dL? Yes.    Patient's BUN today is 16 4. Abnormal electrolyte(s): K+3.0 5. Ordered repletion with: protocol 6. If a panic level lab has been reported, has the CCM MD in charge been notified? No..   Physician:  Idalia Needle Hilliard 04/08/2015 4:38 AM

## 2015-04-09 ENCOUNTER — Encounter (HOSPITAL_COMMUNITY): Payer: Self-pay | Admitting: General Surgery

## 2015-04-09 LAB — BASIC METABOLIC PANEL
ANION GAP: 13 (ref 5–15)
BUN: 11 mg/dL (ref 6–20)
CHLORIDE: 103 mmol/L (ref 101–111)
CO2: 20 mmol/L — AB (ref 22–32)
CREATININE: 0.43 mg/dL — AB (ref 0.44–1.00)
Calcium: 8.2 mg/dL — ABNORMAL LOW (ref 8.9–10.3)
GFR calc non Af Amer: >60 mL/min (ref >60)
Glucose, Bld: 116 mg/dL — ABNORMAL HIGH (ref 65–99)
POTASSIUM: 3.5 mmol/L (ref 3.5–5.1)
SODIUM: 136 mmol/L (ref 135–145)

## 2015-04-09 MED ORDER — SODIUM CHLORIDE 0.9 % IV BOLUS (SEPSIS)
250.0000 mL | Freq: Once | INTRAVENOUS | Status: AC
  Administered 2015-04-09: 250 mL via INTRAVENOUS

## 2015-04-09 MED ORDER — LABETALOL HCL 5 MG/ML IV SOLN
10.0000 mg | INTRAVENOUS | Status: DC | PRN
  Administered 2015-04-09 – 2015-04-14 (×14): 10 mg via INTRAVENOUS
  Filled 2015-04-09 (×14): qty 4

## 2015-04-09 MED ORDER — KCL IN DEXTROSE-NACL 20-5-0.9 MEQ/L-%-% IV SOLN
INTRAVENOUS | Status: DC
  Administered 2015-04-09 – 2015-04-10 (×2): via INTRAVENOUS
  Filled 2015-04-09 (×2): qty 1000

## 2015-04-09 MED ORDER — METOCLOPRAMIDE HCL 5 MG/ML IJ SOLN
5.0000 mg | Freq: Once | INTRAMUSCULAR | Status: AC
  Administered 2015-04-09: 5 mg via INTRAVENOUS
  Filled 2015-04-09: qty 2

## 2015-04-09 NOTE — Progress Notes (Signed)
PULMONARY / CRITICAL CARE MEDICINE   Name: Emily Wyatt MRN: [id number] DOB: Dec 23, 1926    ADMISSION DATE:  04/05/2015 CONSULTATION DATE:  04/05/2015  REFERRING MD :  Dr. Susie Cassette  CHIEF COMPLAINT:  Nausea / vomiting  INITIAL PRESENTATION: 81 y/o F admitted on 9/2 with abdominal pain/distention and vomiting.  Patient found to have an incarcerated hernia and small bowel obstruction.  She was taken for an exploratory laparotomy on 9/2 by Dr. Johna Sheriff and returned to ICU for observation.     SIGNIFICANT EVENTS: 9/02  Admit with abdominal pain/distention, vomiting 2/2 incarcerated hernia LLL & small bowel obstruction.  To or for Ex lap  9/03  Remains intubated in ICU 9/04  Eextubated, confusion  STUDIES:  9/02  CT ABD >> incarcerated left obturator hernia with high-grade small bowel obstruction    SUBJECTIVE: RN reports concern for increasing confusion, tachypnea.  Chronic AF with rates 90-110's.     VITAL SIGNS: Temp:  [97.5 F (36.4 C)-97.9 F (36.6 C)] 97.8 F (36.6 C) (09/06 1200) Pulse Rate:  [4-135] 4 (09/06 1200) Resp:  [22-40] 28 (09/06 1200) BP: (143-240)/(87-142) 183/97 mmHg (09/06 1200) SpO2:  [98 %-100 %] 100 % (09/06 1200)   HEMODYNAMICS:     VENTILATOR SETTINGS:     INTAKE / OUTPUT:  Intake/Output Summary (Last 24 hours) at 04/09/15 1336 Last data filed at 04/09/15 1200  Gross per 24 hour  Intake   2268 ml  Output   2790 ml  Net   -522 ml    PHYSICAL EXAMINATION: General: frail elderly female in NAD, sitting up in bed Neuro:  Awake, alert, not oriented, confusion but speech clear HEENT:  MM pink/dry, edentulous  Cardiovascular: s1s2 irr irr  Lungs:  resp's even/non-labored, lungs bilaterally clear, diminished lower Abdomen:  Soft, midline surgical incision with staples  Skin: warm/dry, no edema  LABS:  CBC  Recent Labs Lab 04/06/15 0546 04/07/15 0356 04/08/15 0348  WBC 4.3 5.1 6.6  HGB 9.9* 9.3* 9.8*  HCT 30.9* 29.2* 30.6*  PLT 157 155  185   Coag's  Recent Labs Lab 04/07/15 0910  APTT 31  INR 1.12   BMET  Recent Labs Lab 04/07/15 0356 04/08/15 0348 04/09/15 0346  NA 139 139 136  K 3.4* 3.0* 3.5  CL 111 105 103  CO2 23 24 20*  BUN 26* 16 11  CREATININE 0.44 0.45 0.43*  GLUCOSE 97 91 116*   Electrolytes  Recent Labs Lab 04/05/15 2330  04/07/15 0356 04/08/15 0348 04/09/15 0346  CALCIUM  --   < > 7.9* 8.3* 8.2*  MG 1.5*  --   --   --   --   < > = values in this interval not displayed.   Sepsis Markers  Recent Labs Lab 04/05/15 1210 04/05/15 1646  LATICACIDVEN 1.71 2.43*   ABG  Recent Labs Lab 04/05/15 2300 04/08/15 0430  PHART 7.431 7.454*  PCO2ART 35.4 31.3*  PO2ART 411* 70.6*   Liver Enzymes  Recent Labs Lab 04/06/15 0546 04/07/15 0356 04/08/15 0348  AST 55* 68* 44*  ALT 59* 65* 60*  ALKPHOS 62 75 83  BILITOT 1.8* 4.0* 1.7*  ALBUMIN 2.9* 2.7* 2.9*   Cardiac Enzymes  Recent Labs Lab 04/05/15 1824 04/05/15 2330 04/06/15 0546  TROPONINI 0.03 0.05* 0.04*   Glucose  Recent Labs Lab 04/06/15 2013 04/07/15 0049 04/07/15 0419 04/07/15 0748  GLUCAP 93 86 88 83    Imaging No results found.   ASSESSMENT / PLAN:  A:  Acute Respiratory Failure - extubated 9/4 Aspiration pneumonitis / PNA P:  Push pulmonary hygiene: IS, mobilize as able / upright positioning  Oxygen as needed to support saturations > 90% Rocephin, D 4/ 7 abx  A: Afib mild RVR - chronic AF P:  Monitor HR / ICU monitoring  IV Labetalol 10 mg Q2 PRN   A: Hypokalemia Elevated Lactic Acid Hyponatremia Hypomagnesemia - improved P:  Trend BMP / UOP  Replace electrolytes as indicated   A: Post-Op Exploratory Laparotomy secondary to incarcerated hernia / SBO Malnutrition Ileus  P:  NPO per surgery NGT to LIS Change IVF to D5NS @ 3ml/hr to add dextrose Continue NGT / supportive care Consider early TPN if not going to be able to eat given malnutrition  Pepcid BID Will need to  address feeding in am 9/7 - TPN vs begin enteral trickle feeding? Reglan x 1 dose  A: Delirium, encephalopathy  P:   Low dose prn haldol Fentanyl for pain   FAMILY  - Updates: Daughter, Bonita Quin, updated at bedside.  Discussed concerns for increased respiratory effort and confusion.  Concerning with changes for worsening delirium in the setting of frail elderly post-operatively.  Daughter indicates the patient made her promise before the surgery that she would not put her "on machines or in a nursing home".  Discussed mechanical ventilation and CPR/defib with daughter and she does not want ACLS for the patient in the event of arrest.   Orders entered to reflect DNR/DNI.   Continue current aggressive medical care otherwise.    PCCM will transfer primary medical service to [facility] as of 9/7 0700.  PCCM will round again in am to assist with TLC placement if needed / feeding issues.   Canary Brim, NP-C  Pulmonary & Critical Care Pgr: [phone] or if no answer [phone] 04/09/2015, 1:36 PM

## 2015-04-09 NOTE — Progress Notes (Signed)
4 Days Post-Op  Subjective: Extubated but confused she cannot answer any questions.  Mittens on she stares at you but no response to questions.  Objective: Vital signs in last 24 hours: Temp:  [97.5 F (36.4 C)-97.9 F (36.6 C)] 97.9 F (36.6 C) (09/06 0800) Pulse Rate:  [38-100] 38 (09/06 0800) Resp:  [17-35] 22 (09/06 0800) BP: (143-240)/(84-142) 167/95 mmHg (09/06 0800) SpO2:  [98 %-100 %] 100 % (09/06 0800) Last BM Date:  (PTA) 150 from the NG, good urine output. Afebrile, RR up in the 30's, BP elevated ABG's OK BMP OK Intake/Output from previous day: 09/05 0701 - 09/06 0700 In: 2589.3 [I.V.:1689.3; NG/GT:90; IV Piggyback:450] Out: 4120 [Urine:3970; Emesis/NG output:150] Intake/Output this shift: Total I/O In: 75 [I.V.:75] Out: 125 [Urine:125]  General appearance: alert and she isn't answering any questions, confused but in no distress.  Frail chronically ill, malnourished elderly female. Resp: clear anterior, BS down in bases. GI: very sore and tender.  No BS, Incision looks fine.  Lab Results:   Recent Labs  04/07/15 0356 04/08/15 0348  WBC 5.1 6.6  HGB 9.3* 9.8*  HCT 29.2* 30.6*  PLT 155 185    BMET  Recent Labs  04/08/15 0348 04/09/15 0346  NA 139 136  K 3.0* 3.5  CL 105 103  CO2 24 20*  GLUCOSE 91 116*  BUN 16 11  CREATININE 0.45 0.43*  CALCIUM 8.3* 8.2*   PT/INR  Recent Labs  04/07/15 0910  LABPROT 14.5  INR 1.12     Recent Labs Lab 04/05/15 1157 04/06/15 0546 04/07/15 0356 04/08/15 0348  AST 103* 55* 68* 44*  ALT 94* 59* 65* 60*  ALKPHOS 95 62 75 83  BILITOT 1.5* 1.8* 4.0* 1.7*  PROT 7.5 5.3* 5.1* 5.8*  ALBUMIN 4.4 2.9* 2.7* 2.9*     Lipase     Component Value Date/Time   LIPASE 22 11/10/2009 2225     Studies/Results: Dg Chest Port 1 View  04/08/2015   CLINICAL DATA:  81 year old female with confusion shortness of breath and weakness. Recent respiratory failure and intubation. Initial encounter.  EXAM: PORTABLE CHEST  - 1 VIEW  COMPARISON:  04/07/2015 and earlier.  FINDINGS: Portable AP semi upright view at 0154 hours. Extubated. Enteric tube remains in place. Stable cardiomegaly and mediastinal contours. Calcified aortic atherosclerosis. Mildly lower lung volumes. Continued dense retrocardiac opacity. Bilateral veiling pulmonary opacity also appears stable. Stable pulmonary vascularity. No pneumothorax.  IMPRESSION: 1. Extubated.  Mildly lower lung volumes.  Stable enteric tube. 2. Otherwise stable ventilation with lower lobe collapse consolidation and possible small pleural effusions.   Electronically Signed   By: Odessa Fleming M.D.   On: 04/08/2015 07:51   Dg Abd Portable 1v  04/08/2015   CLINICAL DATA:  NG tube placement  EXAM: PORTABLE ABDOMEN - 1 VIEW  COMPARISON:  04/05/2015  FINDINGS: An enteric tube is been placed. The tip is just below the left hemidiaphragm consistent with location in the upper stomach. Proximal side hole is projected over the lower chest, likely in the distal esophagus. Consider advancing the tube 4-5 cm for better placement within the stomach.  Interval postoperative changes with skin clips across the midline. Residual gas and stool in the colon and small bowel with decreased bowel distention since previous study. Probable bronchiectasis and scarring in the lung bases. Degenerative changes in the spine.  IMPRESSION: Enteric tube tip appears to be just below the EG junction with proximal side hole likely above the EG junction in  the lower esophagus. Consider advancing the tube for 4-5 cm for better placement.   Electronically Signed   By: Burman Nieves M.D.   On: 04/08/2015 02:10    Medications: . antiseptic oral rinse  7 mL Mouth Rinse QID  . cefTRIAXone (ROCEPHIN)  IV  1 g Intravenous Q24H  . chlorhexidine gluconate  15 mL Mouth Rinse BID  . famotidine (PEPCID) IV  20 mg Intravenous Q12H  . sodium chloride  3 mL Intravenous Q12H   . 0.9 % NaCl with KCl 20 mEq / L 75 mL/hr (04/08/15 2324)    Prior to Admission medications   Medication Sig Start Date End Date Taking? Authorizing Provider  acetaminophen (TYLENOL) 325 MG tablet Take 650 mg by mouth every 6 (six) hours as needed.   Yes Historical Provider, MD  ADVAIR DISKUS 250-50 MCG/DOSE AEPB INHALE ONE PUFF BY MOUTH EVERY 12 HOURS Patient taking differently: Inhale 2 puffs twice daily 02/22/15  Yes Nyoka Cowden, MD  albuterol (PROVENTIL HFA;VENTOLIN HFA) 108 (90 BASE) MCG/ACT inhaler Inhale 2 puffs into the lungs every 4 (four) hours as needed for wheezing or shortness of breath. 07/30/13  Yes Azalia Bilis, MD  aspirin 81 MG tablet Take 81 mg by mouth daily.   Yes Historical Provider, MD  BIOTIN PO Take 1,000 mg by mouth 2 (two) times daily.    Yes Historical Provider, MD  cholecalciferol (VITAMIN D) 1000 UNITS tablet Take 1,000 Units by mouth daily.   Yes Historical Provider, MD  cyanocobalamin 1000 MCG tablet Take 100 mcg by mouth daily.   Yes Historical Provider, MD  cyclobenzaprine (FLEXERIL) 10 MG tablet Take 5 mg by mouth 3 (three) times daily as needed for muscle spasms.   Yes Historical Provider, MD  diltiazem (TIAZAC) 180 MG 24 hr capsule Take 1 capsule (180 mg total) by mouth daily. 09/10/14  Yes Chrystie Nose, MD  docusate sodium (COLACE) 100 MG capsule Take 100 mg by mouth daily as needed for mild constipation. Stool softenener   Yes Historical Provider, MD  escitalopram (LEXAPRO) 10 MG tablet Take 15 mg by mouth daily.   Yes Historical Provider, MD  Ibuprofen 200 MG CAPS Take 1 capsule by mouth daily as needed.   Yes Historical Provider, MD  naproxen sodium (ANAPROX) 220 MG tablet Take 220 mg by mouth 3 (three) times daily as needed.   Yes Historical Provider, MD  Potassium Gluconate 595 MG CAPS Take 1 capsule by mouth daily.   Yes Historical Provider, MD  Dextromethorphan-Guaifenesin [facility] DM PO) Take by mouth 2 (two) times daily.    Historical Provider, MD     Assessment/Plan Obturator hernia with obstruction,  SBO (small bowel obstruction),Small bowel necrosis S/p EXPLORATORY LAPAROTOMY, SMALL BOWEL RESECTION, REPAIR OF INCARCERATED HERNIA, 04/05/15, Dr. Johna Sheriff, [facility] [place] T  POD 4 AF not on anticoagulation Hx of GI bleed 2 years ago Hx of rectal prolapase IBS, Hx of diverticulosis Hx of anxiety and depression Antibiotics: Day 4 Zosyn DVT:    Plan:  Ongoing ileus, keep NG in place. Continue NPO,  Appreciate CCM  Management and care.       LOS: 4 days    Rotunda Worden 04/09/2015

## 2015-04-09 NOTE — Progress Notes (Signed)
Initial Nutrition Assessment  DOCUMENTATION CODES:   Severe malnutrition in context of chronic illness, Underweight  INTERVENTION:   Diet advancement per MD If pt expected to remain NPO, recommend initiating nutrition support. RD to continue to monitor  NUTRITION DIAGNOSIS:   Malnutrition related to chronic illness as evidenced by energy intake < or equal to 75% for > or equal to 1 month, severe depletion of muscle mass, severe depletion of body fat.  GOAL:   Patient will meet greater than or equal to 90% of their needs  MONITOR:   Diet advancement, Labs, Weight trends, Skin, I & O's  REASON FOR ASSESSMENT:   Other (Comment) (Low BMI)    ASSESSMENT:   81 year old female with a history of atrial fibrillation, not an anticoagulation because of life-threatening gastro-intestinal bleeding within the last 2 years, taken off of Coumadin, COPD, who presents to the ER, brought in by EMS for fecal emesis for the last 24 hours. Patient is unable to provide any history and most of the history is obtained from the patient's daughter lives next door.  S/p 9/2 EXPLORATORY LAPAROTOMY, SMALL BOWEL RESECTION, REPAIR OF INCARCERATED HERNIA  Pt in room with daughter at bedside. Unable to obtain any history from patient d/t dementia and confusion. Per daughter, pt was not eating that much PTA. Pt would have toast for breakfast, would normally skip lunch or would graze on chips and then would eat a little dinner with her. Pt's daughter feels that she eats well with family but by herself she doesn't think to eat.  Pt is currently NPO with NGT for ileus and SBO. Pt may be started on TPN per critical care note.  Nutrition-Focused physical exam completed. Findings are severe fat depletion, severe muscle depletion, and no edema.   Labs reviewed: Low Creatinine  Diet Order:  Diet NPO time specified  Skin:  Reviewed, no issues  Last BM:  PTA  Height:   Ht Readings from Last 1 Encounters:   04/05/15  (1.651 m)    Weight:   Wt Readings from Last 1 Encounters:  04/07/15 112 lb 7 oz (51 kg)    Ideal Body Weight:  56.8 kg  BMI:  Body mass index is 18.71 kg/(m^2).  Estimated Nutritional Needs:   Kcal:  1550-1750  Protein:  75-85g  Fluid:  1.7L/day  EDUCATION NEEDS:   No education needs identified at this time  Tilda Franco, MS, RD, LDN Pager: [phone] After Hours Pager: [phone]

## 2015-04-09 NOTE — Progress Notes (Signed)
eLink Physician-Brief Progress Note Patient Name: LADANA CHAVERO DOB: 1927/01/05 MRN: [id number]   Date of Service  04/09/2015  HPI/Events of Note  Hypertension with SBP of 202.  eICU Interventions  Changed labetalol prn to every 2 hours as needed for BP control     Intervention Category Major Interventions: Hypertension - evaluation and management  Yeshaya Vath 04/09/2015, 1:16 AM

## 2015-04-09 NOTE — Progress Notes (Signed)
Physical Therapy Treatment Patient Details Name: Emily Wyatt MRN: [id number] DOB: 1927-01-05 Today's Date: 04/09/2015    History of Present Illness 81 year old female with a history of atrial fibrillation, not an anticoagulation because of life-threatening gastro-intestinal bleeding within the last 2 years, taken off of Coumadin, COPD, admitted due for fecal emesis for the last 24 hourss/p EXPLORATORY LAPAROTOMY, SMALL BOWEL RESECTION, REPAIR OF INCARCERATED HERNIA on 04/05/15 and extubated 9/4    PT Comments    Pt in bed with daughter at bedside.  Pt restless/anxious.  When attempted to sit EOB HR increased to 135, RR 30 and BP was 176/99(123).  Pt repositioned and scooted to [facility] + 2 total assist.  Reported to RN that vitals are too elevated and  contraindicated for therapy tolerance.    Follow Up Recommendations  SNF     Equipment Recommendations       Recommendations for Other Services       Precautions / Restrictions Precautions Precautions: Fall Precaution Comments: NG tube, mittens, ABD incision  Restrictions Weight Bearing Restrictions: No    Mobility  Bed Mobility Overal bed mobility: Needs Assistance;+2 for physical assistance Bed Mobility:  (scooting to [facility])           General bed mobility comments: was only able to assist with scooting to [facility] + 2 assist and reposition to comfort with multiple pillows.  Pt 0%.  Restless/anxious with RR 26  Transfers                    Ambulation/Gait                 Stairs            Wheelchair Mobility    Modified Rankin (Stroke Patients Only)       Balance                                    Cognition   Behavior During Therapy: Restless Overall Cognitive Status: Impaired/Different from baseline                 General Comments: per daughter pt is [facility] and left hearing aides at home in fear of loosing them in the hospital.  Overall pt appears anxious/restless     Exercises      General Comments        Pertinent Vitals/Pain Pain Assessment: Faces (difficult to determine ) Pain Descriptors / Indicators: Grimacing;Restless Pain Intervention(s): Monitored during session;Repositioned    Home Living                      Prior Function            PT Goals (current goals can now be found in the care plan section) Progress towards PT goals: Progressing toward goals    Frequency  Min 3X/week    PT Plan      Co-evaluation             End of Session   Activity Tolerance: Treatment limited secondary to medical complications (Comment) Patient left: in bed;with family/visitor present;with nursing/sitter in room;with bed alarm set     Time: 1040-1050 PT Time Calculation (min) (ACUTE ONLY): 10 min  Charges:  $Therapeutic Activity: 8-22 mins                    G Codes:  Rica Koyanagi  PTA [facility]  Acute  Rehab Pager      [phone]

## 2015-04-09 NOTE — Care Management Note (Signed)
Case Management Note  Patient Details  Name: Emily Wyatt MRN: [id number] Date of Birth: 1927/01/28  Subjective/Objective:           Ischemic bowel with surgical intervention and vent post-op          Action/Plan:Date:  Sept.6, 2016 U.R. performed for needs and level of care. Will continue to follow for Case Management needs.  Marcelle Smiling, RN, BSN, [place]   [phone]   Expected Discharge Date:   (UNKNOWN)               Expected Discharge Plan:  Skilled Nursing Facility  In-House Referral:  Clinical Social Work  Discharge planning Services  CM Consult  Post Acute Care Choice:  NA Choice offered to:  NA  DME Arranged:    DME Agency:     HH Arranged:    HH Agency:     Status of Service:  In process, will continue to follow  Medicare Important Message Given:    Date Medicare IM Given:    Medicare IM give by:    Date Additional Medicare IM Given:    Additional Medicare Important Message give by:     If discussed at Long Length of Stay Meetings, dates discussed:    Additional Comments:  Golda Acre, RN 04/09/2015, 9:50 AM

## 2015-04-10 ENCOUNTER — Inpatient Hospital Stay (HOSPITAL_COMMUNITY): Payer: Medicare Other

## 2015-04-10 DIAGNOSIS — I480 Paroxysmal atrial fibrillation: Secondary | ICD-10-CM

## 2015-04-10 DIAGNOSIS — K460 Unspecified abdominal hernia with obstruction, without gangrene: Secondary | ICD-10-CM

## 2015-04-10 DIAGNOSIS — J9610 Chronic respiratory failure, unspecified whether with hypoxia or hypercapnia: Secondary | ICD-10-CM

## 2015-04-10 DIAGNOSIS — J690 Pneumonitis due to inhalation of food and vomit: Secondary | ICD-10-CM

## 2015-04-10 LAB — BASIC METABOLIC PANEL
ANION GAP: 8 (ref 5–15)
Anion gap: 6 (ref 5–15)
BUN: 13 mg/dL (ref 6–20)
BUN: 13 mg/dL (ref 6–20)
CHLORIDE: 106 mmol/L (ref 101–111)
CO2: 26 mmol/L (ref 22–32)
CO2: 22 mmol/L (ref 22–32)
Calcium: 7.8 mg/dL — ABNORMAL LOW (ref 8.9–10.3)
Calcium: 8.5 mg/dL — ABNORMAL LOW (ref 8.9–10.3)
Chloride: 108 mmol/L (ref 101–111)
Creatinine, Ser: <0.3 mg/dL — ABNORMAL LOW (ref 0.44–1.00)
Creatinine, Ser: 0.42 mg/dL — ABNORMAL LOW (ref 0.44–1.00)
GLUCOSE: 131 mg/dL — AB (ref 65–99)
Glucose, Bld: 128 mg/dL — ABNORMAL HIGH (ref 65–99)
POTASSIUM: 2.6 mmol/L — AB (ref 3.5–5.1)
POTASSIUM: 5.2 mmol/L — AB (ref 3.5–5.1)
SODIUM: 138 mmol/L (ref 135–145)
Sodium: 138 mmol/L (ref 135–145)

## 2015-04-10 LAB — CBC
HEMATOCRIT: 29.7 % — AB (ref 36.0–46.0)
Hemoglobin: 9.9 g/dL — ABNORMAL LOW (ref 12.0–15.0)
MCH: 31 pg (ref 26.0–34.0)
MCHC: 33.3 g/dL (ref 30.0–36.0)
MCV: 93.1 fL (ref 78.0–100.0)
PLATELETS: 223 10*3/uL (ref 150–400)
RBC: 3.19 MIL/uL — AB (ref 3.87–5.11)
RDW: 13.8 % (ref 11.5–15.5)
WBC: 7.8 10*3/uL (ref 4.0–10.5)

## 2015-04-10 LAB — MAGNESIUM: Magnesium: 1.3 mg/dL — ABNORMAL LOW (ref 1.7–2.4)

## 2015-04-10 LAB — PHOSPHORUS: PHOSPHORUS: 1.9 mg/dL — AB (ref 2.5–4.6)

## 2015-04-10 MED ORDER — LIP MEDEX EX OINT
TOPICAL_OINTMENT | CUTANEOUS | Status: AC
  Administered 2015-04-10: 09:00:00
  Filled 2015-04-10: qty 7

## 2015-04-10 MED ORDER — POTASSIUM CHLORIDE 10 MEQ/100ML IV SOLN
10.0000 meq | INTRAVENOUS | Status: AC
  Administered 2015-04-10 (×5): 10 meq via INTRAVENOUS
  Filled 2015-04-10: qty 100

## 2015-04-10 MED ORDER — INSULIN ASPART 100 UNIT/ML ~~LOC~~ SOLN
0.0000 [IU] | Freq: Three times a day (TID) | SUBCUTANEOUS | Status: DC
  Administered 2015-04-11: 1 [IU] via SUBCUTANEOUS
  Administered 2015-04-11: 2 [IU] via SUBCUTANEOUS
  Administered 2015-04-11 – 2015-04-17 (×17): 1 [IU] via SUBCUTANEOUS

## 2015-04-10 MED ORDER — KCL IN DEXTROSE-NACL 40-5-0.9 MEQ/L-%-% IV SOLN
INTRAVENOUS | Status: AC
  Administered 2015-04-10: 10:00:00 via INTRAVENOUS
  Filled 2015-04-10 (×4): qty 1000

## 2015-04-10 MED ORDER — FAT EMULSION 20 % IV EMUL
220.0000 mL | INTRAVENOUS | Status: AC
  Administered 2015-04-10: 220 mL via INTRAVENOUS
  Filled 2015-04-10: qty 250

## 2015-04-10 MED ORDER — BISACODYL 10 MG RE SUPP
10.0000 mg | Freq: Two times a day (BID) | RECTAL | Status: AC
  Administered 2015-04-10 (×2): 10 mg via RECTAL
  Filled 2015-04-10 (×2): qty 1

## 2015-04-10 MED ORDER — SODIUM CHLORIDE 0.9 % IJ SOLN
10.0000 mL | Freq: Two times a day (BID) | INTRAMUSCULAR | Status: DC
  Administered 2015-04-10: 10 mL
  Administered 2015-04-11 – 2015-04-12 (×2): 20 mL
  Administered 2015-04-12 – 2015-04-16 (×4): 10 mL

## 2015-04-10 MED ORDER — MAGNESIUM SULFATE 2 GM/50ML IV SOLN
2.0000 g | Freq: Once | INTRAVENOUS | Status: AC
  Administered 2015-04-10: 2 g via INTRAVENOUS
  Filled 2015-04-10: qty 50

## 2015-04-10 MED ORDER — M.V.I. ADULT IV INJ
INJECTION | INTRAVENOUS | Status: AC
  Administered 2015-04-10: 21:00:00 via INTRAVENOUS
  Filled 2015-04-10: qty 660

## 2015-04-10 MED ORDER — POTASSIUM CHLORIDE 20 MEQ/15ML (10%) PO SOLN
40.0000 meq | ORAL | Status: AC
  Administered 2015-04-10 (×2): 40 meq
  Filled 2015-04-10 (×2): qty 30

## 2015-04-10 MED ORDER — POTASSIUM PHOSPHATES 15 MMOLE/5ML IV SOLN
20.0000 mmol | Freq: Once | INTRAVENOUS | Status: AC
  Administered 2015-04-10: 20 mmol via INTRAVENOUS
  Filled 2015-04-10: qty 6.67

## 2015-04-10 MED ORDER — SODIUM CHLORIDE 0.9 % IJ SOLN
10.0000 mL | INTRAMUSCULAR | Status: DC | PRN
  Administered 2015-04-12 – 2015-04-18 (×3): 10 mL
  Filled 2015-04-10 (×3): qty 40

## 2015-04-10 MED ORDER — CHLORHEXIDINE GLUCONATE 0.12 % MT SOLN
OROMUCOSAL | Status: AC
  Filled 2015-04-10: qty 15

## 2015-04-10 MED ORDER — LIP MEDEX EX OINT: TOPICAL_OINTMENT | CUTANEOUS | Status: DC | PRN

## 2015-04-10 NOTE — Progress Notes (Signed)
eLink Physician-Brief Progress Note Patient Name: Emily Wyatt DOB: [birth year] MRN: [id number]   Date of Service  04/10/2015  HPI/Events of Note  hypokalemia  eICU Interventions  Potassium replaced     Intervention Category Major Interventions: Electrolyte abnormality - evaluation and management  Emily Wyatt 04/10/2015, 5:31 AM

## 2015-04-10 NOTE — Progress Notes (Signed)
   04/10/15 1100  Clinical Encounter Type  Visited With Patient and family together  Visit Type Initial;Psychological support;Spiritual support;Critical Care  Referral From Nurse  Consult/Referral To Chaplain  Spiritual Encounters  Spiritual Needs Prayer;Emotional;Other (Comment) (Pastoral Conversation)  Stress Factors  Family Stress Factors Exhausted;Family relationships;Major life changes   Chaplain visited with patient and her daughter per request from charge nurse. The patient's daughter was at her bedside.  The patient's daughter stated that she had been the patient's primary caregiver. They have good support from their local church and from other friends/family. A stress factor for the daughter is having a sister with bi-polar who is struggling with her mother's illness.  Chaplain interventions included pastoral conversation with the daughter; emotional support; giving of prayer blanket/bone pillow/prayer heart; and prayer.  The daughter expressed her interest in a follow-up visit. The Chaplain will return to follow up with the patient and her daughter.

## 2015-04-10 NOTE — Progress Notes (Signed)
Peripherally Inserted Central Catheter/Midline Placement  The IV Nurse has discussed with the patient and/or persons authorized to consent for the patient, the purpose of this procedure and the potential benefits and risks involved with this procedure.  The benefits include less needle sticks, lab draws from the catheter and patient may be discharged home with the catheter.  Risks include, but not limited to, infection, bleeding, blood clot (thrombus formation), and puncture of an artery; nerve damage and irregular heat beat.  Alternatives to this procedure were also discussed.  PICC/Midline Placement Documentation  PICC / Midline Double Lumen 04/10/15 PICC Right Brachial 41 cm 2 cm (Active)  Indication for Insertion or Continuance of Line Administration of hyperosmolar/irritating solutions (i.e. TPN, Vancomycin, etc.) 04/10/2015  7:00 PM  Exposed Catheter (cm) 2 cm 04/10/2015  7:00 PM  Site Assessment Ecchymotic 04/10/2015  7:00 PM  Dressing Change Due 04/17/15 04/10/2015  7:00 PM    Telephone consent   Stacie Glaze Horton 04/10/2015, 7:04 PM

## 2015-04-10 NOTE — Progress Notes (Signed)
PULMONARY / CRITICAL CARE MEDICINE   Name: Emily Wyatt MRN: [id number] DOB: July 09, 1927    ADMISSION DATE:  04/05/2015 CONSULTATION DATE:  04/05/2015  REFERRING MD :  Dr. Susie Cassette  CHIEF COMPLAINT:  Nausea / vomiting  INITIAL PRESENTATION: 81 y/o F admitted on 9/2 with abdominal pain/distention and vomiting.  Patient found to have an incarcerated hernia and small bowel obstruction.  She was taken for an exploratory laparotomy on 9/2 by Dr. Johna Sheriff and returned to ICU for observation.     SIGNIFICANT EVENTS: 9/02  Admit with abdominal pain/distention, vomiting 2/2 incarcerated hernia LLL & small bowel obstruction.  To or for Ex lap  9/03  Remains intubated in ICU 9/04  Extubated, confusion 9/05  Increasing confusion, mild tachypnea but acceptable work of breathing, chronic AF 90-110's  STUDIES:  9/02  CT ABD >> incarcerated left obturator hernia with high-grade small bowel obstruction    SUBJECTIVE: RN reports hypokalemia overnight (2.6), replaced by ELink.  Family at bedside concerned regarding feeding plan.     VITAL SIGNS: Temp:  [97.8 F (36.6 C)-99.9 F (37.7 C)] 98.2 F (36.8 C) (09/07 0000) Pulse Rate:  [4-135] 117 (09/07 0800) Resp:  [17-40] 27 (09/07 0800) BP: (130-183)/(63-111) 169/102 mmHg (09/07 0800) SpO2:  [96 %-100 %] 99 % (09/07 0800)   HEMODYNAMICS:     VENTILATOR SETTINGS:     INTAKE / OUTPUT:  Intake/Output Summary (Last 24 hours) at 04/10/15 4098 Last data filed at 04/10/15 0700  Gross per 24 hour  Intake   1935 ml  Output   1325 ml  Net    610 ml    PHYSICAL EXAMINATION: General: frail elderly female in NAD Neuro:  Awake, alert, not oriented, confusion but speech clear HEENT:  MM pink/dry, edentulous  Cardiovascular: s1s2 irr irr  Lungs:  resp's even/non-labored, lungs bilaterally clear, diminished lower Abdomen:  Soft, midline surgical incision with staples  Skin: warm/dry, no edema  LABS:  CBC  Recent Labs Lab 04/07/15 0356  04/08/15 0348 04/10/15 0340  WBC 5.1 6.6 7.8  HGB 9.3* 9.8* 9.9*  HCT 29.2* 30.6* 29.7*  PLT 155 185 223   Coag's  Recent Labs Lab 04/07/15 0910  APTT 31  INR 1.12   BMET  Recent Labs Lab 04/08/15 0348 04/09/15 0346 04/10/15 0340  NA 139 136 138  K 3.0* 3.5 2.6*  CL 105 103 106  CO2 24 20* 26  BUN 16 11 13   CREATININE 0.45 0.43* <0.30*  GLUCOSE 91 116* 128*   Electrolytes  Recent Labs Lab 04/05/15 2330  04/08/15 0348 04/09/15 0346 04/10/15 0340  CALCIUM  --   < > 8.3* 8.2* 7.8*  MG 1.5*  --   --   --   --   < > = values in this interval not displayed.   Sepsis Markers  Recent Labs Lab 04/05/15 1210 04/05/15 1646  LATICACIDVEN 1.71 2.43*   ABG  Recent Labs Lab 04/05/15 2300 04/08/15 0430  PHART 7.431 7.454*  PCO2ART 35.4 31.3*  PO2ART 411* 70.6*   Liver Enzymes  Recent Labs Lab 04/06/15 0546 04/07/15 0356 04/08/15 0348  AST 55* 68* 44*  ALT 59* 65* 60*  ALKPHOS 62 75 83  BILITOT 1.8* 4.0* 1.7*  ALBUMIN 2.9* 2.7* 2.9*   Cardiac Enzymes  Recent Labs Lab 04/05/15 1824 04/05/15 2330 04/06/15 0546  TROPONINI 0.03 0.05* 0.04*   Glucose  Recent Labs Lab 04/06/15 2013 04/07/15 0049 04/07/15 0419 04/07/15 0748  GLUCAP 93 86 88 83  Imaging No results found.   ASSESSMENT / PLAN:  A:  Acute Respiratory Failure - extubated 9/4 Aspiration pneumonitis / PNA P:  Push pulmonary hygiene: IS, mobilize as able / upright positioning  Oxygen as needed to support saturations > 90% Rocephin, D 5/7 abx Intermittent CXR  A: Afib mild RVR - chronic AF P:  Monitor HR / ICU monitoring  IV Labetalol 10 mg Q2 PRN   A: Hypokalemia Elevated Lactic Acid Hyponatremia Hypomagnesemia - improved P:  Trend BMP / UOP, follow up labs this afternoon Replace electrolytes as indicated  Keep K+ >4 with ileus  A: Post-Op Exploratory Laparotomy secondary to incarcerated hernia / SBO Malnutrition Ileus  P:  NPO per surgery NGT to  LIS Change IVF to D5NS with 40 mEq KCL @ 63ml/hr  Consider early TPN if not going to be able to eat given malnutrition  Pepcid BID Reglan x 1 dose 9/5 Begin TNA via PICC line.  Discussed PICC line with daughter.    A:  Anemia P: Monitor CBC  A: Delirium, encephalopathy  P:   Low dose prn haldol Fentanyl for pain   FAMILY  - Updates: Daughter, Bonita Quin, updated over the phone on plan of care.  Plan for PICC line with TPN for nutrition.  PCCM will sign off.     Canary Brim, NP-C [facility] Pgr: [phone] or if no answer [phone] 04/10/2015, 8:22 AM

## 2015-04-10 NOTE — Progress Notes (Signed)
5 Days Post-Op  Subjective: Alert.  Stable. Oriented to person, but not place or situation.  No memory for operative events.  Unaware that she is in a hospital. Feeble.  Deconditioned.  No stool or flatus.  Urine output good.  NG output not recorded. Potassium 2.6, down from yesterday despite intervention.  Creatinine 0.3.  Glucose 128.  Sodium 135.  Hemoglobin 9.9.  Stable.  WBC 7800.  Objective: Vital signs in last 24 hours: Temp:  [97.8 F (36.6 C)-99.9 F (37.7 C)] 98.2 F (36.8 C) (09/07 0000) Pulse Rate:  [4-135] 86 (09/07 0621) Resp:  [17-40] 26 (09/07 0621) BP: (130-183)/(63-111) 178/97 mmHg (09/07 0600) SpO2:  [96 %-100 %] 98 % (09/07 0621) Last BM Date:  (PTA)  Intake/Output from previous day: 09/06 0701 - 09/07 0700 In: 1991.3 [I.V.:1751.3; NG/GT:90; IV Piggyback:150] Out: 1450 [Urine:1450] Intake/Output this shift: Total I/O In: 901.3 [I.V.:851.3; IV Piggyback:50] Out: 1050 [Urine:1050]  General appearance: Alert.  Feeble.  Oriented to person.  Answers simple questions appropriately.  Unaware of surroundings or circumstances. GI: Soft.  Silent.  Minimally distended.  Wound clean and intact.  Lab Results:  Results for orders placed or performed during the hospital encounter of 04/05/15 (from the past 24 hour(s))  Basic metabolic panel     Status: Abnormal   Collection Time: 04/10/15  3:40 AM  Result Value Ref Range   Sodium 138 135 - 145 mmol/L   Potassium 2.6 (LL) 3.5 - 5.1 mmol/L   Chloride 106 101 - 111 mmol/L   CO2 26 22 - 32 mmol/L   Glucose, Bld 128 (H) 65 - 99 mg/dL   BUN 13 6 - 20 mg/dL   Creatinine, Ser <1.61 (L) 0.44 - 1.00 mg/dL   Calcium 7.8 (L) 8.9 - 10.3 mg/dL   GFR calc non Af Amer NOT CALCULATED >60 mL/min   GFR calc Af Amer NOT CALCULATED >60 mL/min   Anion gap 6 5 - 15  CBC     Status: Abnormal   Collection Time: 04/10/15  3:40 AM  Result Value Ref Range   WBC 7.8 4.0 - 10.5 K/uL   RBC 3.19 (L) 3.87 - 5.11 MIL/uL   Hemoglobin 9.9 (L)  12.0 - 15.0 g/dL   HCT 09.6 (L) 04.5 - 40.9 %   MCV 93.1 78.0 - 100.0 fL   MCH 31.0 26.0 - 34.0 pg   MCHC 33.3 30.0 - 36.0 g/dL   RDW 81.1 91.4 - 78.2 %   Platelets 223 150 - 400 K/uL     Studies/Results: No results found.  Marland Kitchen antiseptic oral rinse  7 mL Mouth Rinse QID  . bisacodyl  10 mg Rectal BID  . cefTRIAXone (ROCEPHIN)  IV  1 g Intravenous Q24H  . chlorhexidine gluconate  15 mL Mouth Rinse BID  . famotidine (PEPCID) IV  20 mg Intravenous Q12H  . potassium chloride  10 mEq Intravenous Q1 Hr x 5  . potassium chloride  40 mEq Per Tube Q4H  . sodium chloride  3 mL Intravenous Q12H     Assessment/Plan: s/p Procedure(s):  EXPLORATORY LAPAROTOMY, SMALL BOWEL RESECTION, REPAIR OF INCARCERATED   POD #5.  Laparotomy, small bowel resection, repair of incarcerated obturator hernia with Flex HD mesh and peritoneal closure. Dr. Johna Sheriff Still has ileus.  Continue NG. Dulcolax suppository PT consult reordered.  Needs to mobilize to tolerance  ID.--She is on Rocephin, but I do not think there is any indication for and continuing antibiotics from a surgical standpoint. Advise  discontinue antibiotics unless there is a reason from the medical standpoint  Hypokalemia.  I have ordered KCl runs.  Not sure her GI tract will sort this yet. Check potassium this afternoon and again tomorrow  Atrial fibrillation, not on anticoagulation History of GI bleed 2 years ago History of rectal prolapse History anxiety and depression  @  LOS: 5 days    Deshonda Cryderman M 04/10/2015  . .prob

## 2015-04-10 NOTE — Progress Notes (Signed)
[facility] PROGRESS NOTE  Emily Wyatt ZOX:[id number] DOB: 03-16-1927 DOA: 04/05/2015 PCP: Pearson Grippe, MD  Assessment/Plan: 1. Post operative delirium- patient developed delirium post op. Will continue with haldol prn. 2. Acute respiratory failure- resolved, she is on ceftriaxone  day 5/7. Continue oxygen prn. 3. Chronic atrial fibrillation- continue Labetolol 10 mg q 2 hr prn, patient is not on anticoagulation. 4. Hypokalemia- replaced this morning,  Will follow bmp. 5. Malnutrition- started TNA via picc line today. 6. Anemia- post op, will continue to follow the cbc.  Code Status: DNR Family Communication: No family at bedside Disposition Plan: Likely SNF   Consultants:  CCM  Surgery   Procedures:  S/P Laparotomy, small bowel resection, repair of incarcerated obturator hernia with Flex HD mesh and peritoneal closure.  Antibiotics:  Ceftriaxone  HPI/Subjective: 81 year old female admitted on 9-16 with abdominal pain and distention with vomiting. At that time she was found to have incarcerated hernia and small bowel obstruction. Patient underwent exploratory laparotomy on 9-16 by Dr. Johna Sheriff. At that time she had a small bowel resection, repair of incarcerated obturator hernia. Patient was extubated in the ICU postextubation she has been confused. Patient also had increased work of breathing, found to have possible pneumonia started on antibiotic for aspiration pneumonia.  At this time patient continues to be confused, she is alert but not oriented 2.  Objective: Filed Vitals:   04/10/15 1000  BP: 139/101  Pulse: 39  Temp:   Resp: 35    Intake/Output Summary (Last 24 hours) at 04/10/15 1039 Last data filed at 04/10/15 1006  Gross per 24 hour  Intake   2085 ml  Output   1205 ml  Net    880 ml   Filed Weights   04/05/15 1103 04/05/15 2300 04/07/15 0400  Weight: 44.906 kg (99 lb) 50.1 kg (110 lb 7.2 oz) 51 kg (112 lb 7 oz)    Exam:   General:   Appears in no acute distress, confused  Cardiovascular: S1-S2 is regular  Respiratory: Clear to auscultation bilaterally  Abdomen: Soft, nontender, no organomegaly  Musculoskeletal: No cyanosis/clubbing/edema of the lower extremities   Data Reviewed: Basic Metabolic Panel:  Recent Labs Lab 04/05/15 2330 04/06/15 0546 04/07/15 0356 04/08/15 0348 04/09/15 0346 04/10/15 0340  NA  --  133* 139 139 136 138  K  --  3.1* 3.4* 3.0* 3.5 2.6*  CL  --  103 111 105 103 106  CO2  --  21* 23 24 20* 26  GLUCOSE  --  115* 97 91 116* 128*  BUN  --  27* 26* CREATININE  --  0.50 0.44 0.45 0.43* <0.30*  CALCIUM  --  7.8* 7.9* 8.3* 8.2* 7.8*  MG 1.5*  --   --   --   --   --    Liver Function Tests:  Recent Labs Lab 04/05/15 1157 04/06/15 0546 04/07/15 0356 04/08/15 0348  AST 103* 55* 68* 44*  ALT 94* 59* 65* 60*  ALKPHOS 95 62 75 83  BILITOT 1.5* 1.8* 4.0* 1.7*  PROT 7.5 5.3* 5.1* 5.8*  ALBUMIN 4.4 2.9* 2.7* 2.9*   No results for input(s): LIPASE, AMYLASE in the last 168 hours. No results for input(s): AMMONIA in the last 168 hours. CBC:  Recent Labs Lab 04/05/15 1157 04/05/15 1231 04/06/15 0546 04/07/15 0356 04/08/15 0348 04/10/15 0340  WBC 8.1  --  4.3 5.1 6.6 7.8  NEUTROABS  --   --   --  4.4 5.4  --   HGB 12.1 13.9 9.9* 9.3* 9.8* 9.9*  HCT 36.5 41.0 30.9* 29.2* 30.6* 29.7*  MCV 94.8  --  95.7 97.7 96.8 93.1  PLT 238  --  157 155 185 223   Cardiac Enzymes:  Recent Labs Lab 04/05/15 1824 04/05/15 2330 04/06/15 0546  TROPONINI 0.03 0.05* 0.04*   BNP (last 3 results) No results for input(s): BNP in the last 8760 hours.  ProBNP (last 3 results) No results for input(s): PROBNP in the last 8760 hours.  CBG:  Recent Labs Lab 04/06/15 2013 04/07/15 0049 04/07/15 0419 04/07/15 0748  GLUCAP 93 86 88 83    Recent Results (from the past 240 hour(s))  MRSA PCR Screening     Status: None   Collection Time: 04/05/15 10:39 PM  Result Value Ref  Range Status   MRSA by PCR NEGATIVE NEGATIVE Final    Comment:        The GeneXpert MRSA Assay (FDA approved for NASAL specimens only), is one component of a comprehensive MRSA colonization surveillance program. It is not intended to diagnose MRSA infection nor to guide or monitor treatment for MRSA infections.      Studies: No results found.  Scheduled Meds: . antiseptic oral rinse  7 mL Mouth Rinse QID  . bisacodyl  10 mg Rectal BID  . cefTRIAXone (ROCEPHIN)  IV  1 g Intravenous Q24H  . chlorhexidine gluconate  15 mL Mouth Rinse BID  . famotidine (PEPCID) IV  20 mg Intravenous Q12H  . insulin aspart  0-9 Units Subcutaneous Q8H  . potassium chloride  10 mEq Intravenous Q1 Hr x 5  . potassium chloride  40 mEq Per Tube Q4H  . sodium chloride  3 mL Intravenous Q12H   Continuous Infusions: . dextrose 5 % and 0.9 % NaCl with KCl 40 mEq/L 75 mL/hr at 04/10/15 1005    Principal Problem:   Incarcerated obturator hernia with SBO Active Problems:   Atrial fibrillation   Chronic asthma   Chronic respiratory failure   SBO (small bowel obstruction)   Incarcerated hernia   Acute respiratory failure   Aspiration pneumonia    Time spent: 25 min    [facility] S  [facility] Pager [phone]. If 7PM-7AM, please contact night-coverage at [email or web address], password [facility] 04/10/2015, 10:39 AM  LOS: 5 days

## 2015-04-10 NOTE — Progress Notes (Signed)
CRITICAL VALUE ALERT  Critical value received:  Potassium 2.6  Date of notification:  04/10/2015  Time of notification:  0457  Critical value read back: yes  Nurse who received alert:  Marrion Coy, RN  MD notified (1st page):  Dr. Darrick Penna   Time of first page:  [phone]  MD notified (2nd page):  Time of second page:  Responding MD:  Dr. Darrick Penna  Time MD responded:  [phone]

## 2015-04-10 NOTE — Progress Notes (Signed)
PARENTERAL NUTRITION CONSULT NOTE - INITIAL  Pharmacy Consult for TPN Indication: Prolonged Ileus  Allergies  Allergen Reactions  . Codeine Nausea And Vomiting  . Morphine And Related Nausea And Vomiting  . Promethazine Hcl     hallucinations    Patient Measurements: Height: 5\' 5"  (165.1 cm) Weight: 112 lb 7 oz (51 kg) IBW/kg (Calculated) : 57  Vital Signs: Temp: 98.2 F (36.8 C) (09/07 0000) Temp Source: Axillary (09/07 0000) BP: 169/102 mmHg (09/07 0800) Pulse Rate: 117 (09/07 0800) Intake/Output from previous day: 09/06 0701 - 09/07 0700 In: 2040 [I.V.:1800; NG/GT:90; IV Piggyback:150] Out: 1450 [Urine:1450]  Labs:  Recent Labs  04/07/15 0910 04/08/15 0348 04/10/15 0340  WBC  --  6.6 7.8  HGB  --  9.8* 9.9*  HCT  --  30.6* 29.7*  PLT  --  185 223  APTT 31  --   --   INR 1.12  --   --      Recent Labs  04/08/15 0348 04/09/15 0346 04/10/15 0340  NA 139 136 138  K 3.0* 3.5 2.6*  CL 105 103 106  CO2 24 20* 26  GLUCOSE 91 116* 128*  BUN 16 11 13   CREATININE 0.45 0.43* <0.30*  CALCIUM 8.3* 8.2* 7.8*  PROT 5.8*  --   --   ALBUMIN 2.9*  --   --   AST 44*  --   --   ALT 60*  --   --   ALKPHOS 83  --   --   BILITOT 1.7*  --   --    CrCl cannot be calculated (Patient has no serum creatinine result on file.).   No results for input(s): GLUCAP in the last 72 hours.  Medical History: Past Medical History  Diagnosis Date  . Diverticulitis   . Atrial fibrillation   . LGI bleed   . Anticoagulant long-term use   . Anemia due to blood loss   . Rectal prolapse   . Diverticulosis of colon   . Chronic headaches   . Gallstones   . IBS (irritable bowel syndrome)   . Bowel obstruction   . UTI (lower urinary tract infection)   . Depression   . Anxiety     Medications:  Infusions:  . dextrose 5 % and 0.9 % NaCl with KCl 40 mEq/L      Insulin Requirements in the past 24 hours: None  Current Nutrition: NPO  IVF: D5 NS KCl 40 meq/L @ 75  ml/hr  Central access: PICC placement pending TPN start date: 9/7 pending PICC placement  ASSESSMENT                                                                                                          HPI: 56 yoF presents to ED on 9/2 with intractable N/V. PMH includes afib, COPD, recurrent bowel obstruction and hernia surgery. CT (9/2) shows SBO from incarcerated hernia, as well as bilateral pneumonia or aspiration.  NG tube placed, surgery is consulted, s/p OR on 9/2 for hernia and SBO.  She has not had any stool or flatus as of 9/7, still has ileus, and is unable to tolerate enteral feedings.  Pharmacy is consulted to start TPN.  High risk of refeeding, pt has had poor PO intake for several weeks, NPO since admission, and has electrolyte abnormalities.  Significant events:  9/2 s/p OR for exp lap, small bowel resection, hernia repair, peritoneal closure. 9/7 Remains unable to tolerate enteral feedings, TPN ordered.  Today:    Glucose: at goal < 150's  Electrolytes: K low (replaced 50 mEq IV and 80 mEq PO per MD), Mag and Phos significantly low (replaced IV).  Na WNL, Corr Ca 8.7.  Renal: SCr remain low.  LFTs:  Last AST/ALT and Tbili mildly elevated. Alk Phos WNL. (9/5)  TGs: pending  Prealbumin:  pending  NUTRITIONAL GOALS                                                                                             RD recs (9/6): 75-85 g protein/day, 1550-1750 kcal/day Clinimix 5/15 at a goal rate of 70 ml/hr + 20% fat emulsion at 10 ml/hr to provide: 84 g/day protein, 1672 Kcal/day.  PLAN                                                                                                                          Magnesium 2g IV x1 dose  KPhos 20 mmol IV x1 dose  At 1800 today:  Start Clinimix E 5/15 at 30 ml/hr.  20% fat emulsion at 10 ml/hr.  Plan to advance as tolerated to the goal rate.  TPN to contain standard multivitamins and trace elements.  Reduce IVF to  45 ml/hr.  Add CBGs and sensitive SSI q8h  TPN lab panels on Mondays & Thursdays.  F/u daily.   Gretta Arab PharmD, BCPS Pager [phone] 04/10/2015 3:30 PM

## 2015-04-11 ENCOUNTER — Inpatient Hospital Stay (HOSPITAL_COMMUNITY): Payer: Medicare Other

## 2015-04-11 DIAGNOSIS — J9601 Acute respiratory failure with hypoxia: Secondary | ICD-10-CM

## 2015-04-11 LAB — COMPREHENSIVE METABOLIC PANEL
ALT: 33 U/L (ref 14–54)
AST: 29 U/L (ref 15–41)
Albumin: 2.9 g/dL — ABNORMAL LOW (ref 3.5–5.0)
Alkaline Phosphatase: 69 U/L (ref 38–126)
Anion gap: 6 (ref 5–15)
BUN: 10 mg/dL (ref 6–20)
CHLORIDE: 107 mmol/L (ref 101–111)
CO2: 24 mmol/L (ref 22–32)
CREATININE: 0.38 mg/dL — AB (ref 0.44–1.00)
Calcium: 8.2 mg/dL — ABNORMAL LOW (ref 8.9–10.3)
GFR calc Af Amer: >60 mL/min (ref >60)
GFR calc non Af Amer: >60 mL/min (ref >60)
Glucose, Bld: 145 mg/dL — ABNORMAL HIGH (ref 65–99)
POTASSIUM: 4.5 mmol/L (ref 3.5–5.1)
SODIUM: 137 mmol/L (ref 135–145)
Total Bilirubin: 0.8 mg/dL (ref 0.3–1.2)
Total Protein: 5.5 g/dL — ABNORMAL LOW (ref 6.5–8.1)

## 2015-04-11 LAB — DIFFERENTIAL
BASOS ABS: 0 10*3/uL (ref 0.0–0.1)
BASOS PCT: 0 % (ref 0–1)
EOS ABS: 0.2 10*3/uL (ref 0.0–0.7)
Eosinophils Relative: 2 % (ref 0–5)
LYMPHS PCT: 11 % — AB (ref 12–46)
Lymphs Abs: 1 10*3/uL (ref 0.7–4.0)
MONO ABS: 1.1 10*3/uL — AB (ref 0.1–1.0)
Monocytes Relative: 12 % (ref 3–12)
Neutro Abs: 7.2 10*3/uL (ref 1.7–7.7)
Neutrophils Relative %: 75 % (ref 43–77)

## 2015-04-11 LAB — GLUCOSE, CAPILLARY
GLUCOSE-CAPILLARY: 138 mg/dL — AB (ref 65–99)
GLUCOSE-CAPILLARY: 114 mg/dL — AB (ref 65–99)
GLUCOSE-CAPILLARY: 138 mg/dL — AB (ref 65–99)
Glucose-Capillary: 149 mg/dL — ABNORMAL HIGH (ref 65–99)
Glucose-Capillary: 162 mg/dL — ABNORMAL HIGH (ref 65–99)
Glucose-Capillary: 113 mg/dL — ABNORMAL HIGH (ref 65–99)

## 2015-04-11 LAB — CBC
HEMATOCRIT: 30.3 % — AB (ref 36.0–46.0)
HEMOGLOBIN: 10.1 g/dL — AB (ref 12.0–15.0)
MCH: 31.4 pg (ref 26.0–34.0)
MCHC: 33.3 g/dL (ref 30.0–36.0)
MCV: 94.1 fL (ref 78.0–100.0)
Platelets: 240 10*3/uL (ref 150–400)
RBC: 3.22 MIL/uL — AB (ref 3.87–5.11)
RDW: 14.2 % (ref 11.5–15.5)
WBC: 9.5 10*3/uL (ref 4.0–10.5)

## 2015-04-11 LAB — PHOSPHORUS: PHOSPHORUS: 2.6 mg/dL (ref 2.5–4.6)

## 2015-04-11 LAB — MAGNESIUM: Magnesium: 1.8 mg/dL (ref 1.7–2.4)

## 2015-04-11 LAB — PREALBUMIN: PREALBUMIN: 9.3 mg/dL — AB (ref 18–38)

## 2015-04-11 LAB — TRIGLYCERIDES: TRIGLYCERIDES: 103 mg/dL (ref <150)

## 2015-04-11 MED ORDER — BISACODYL 10 MG RE SUPP
10.0000 mg | Freq: Two times a day (BID) | RECTAL | Status: AC
  Administered 2015-04-11 (×2): 10 mg via RECTAL
  Filled 2015-04-11 (×2): qty 1

## 2015-04-11 MED ORDER — KCL IN DEXTROSE-NACL 40-5-0.9 MEQ/L-%-% IV SOLN
INTRAVENOUS | Status: DC
  Filled 2015-04-11 (×3): qty 1000

## 2015-04-11 MED ORDER — FAT EMULSION 20 % IV EMUL
240.0000 mL | INTRAVENOUS | Status: AC
  Administered 2015-04-11: 240 mL via INTRAVENOUS
  Filled 2015-04-11: qty 250

## 2015-04-11 MED ORDER — TRACE MINERALS CR-CU-MN-SE-ZN 10-1000-500-60 MCG/ML IV SOLN
INTRAVENOUS | Status: AC
  Administered 2015-04-11: 18:00:00 via INTRAVENOUS
  Filled 2015-04-11: qty 1680

## 2015-04-11 NOTE — Progress Notes (Signed)
   04/11/15 1200  Clinical Encounter Type  Visited With Patient and family together  Visit Type Follow-up;Spiritual support  Referral From Nurse  Consult/Referral To Chaplain  Spiritual Encounters  Spiritual Needs Emotional;Other (Comment) (Pastoral Conversation)   Chaplain followed up with the patient and her daughter from a previous visit. The daughter and another woman were in the room with the patient. The daughter indicated that today was a better day than yesterday. The patient was able to verbalize how she was feeling today to the Chaplain.   The Chaplain will continue to follow this patient and her family as long as needed.

## 2015-04-11 NOTE — Progress Notes (Signed)
Physical Therapy Treatment Patient Details Name: Emily Wyatt MRN: [id number] DOB: 1927/05/30 Today's Date: 04/11/2015    History of Present Illness 81 year old female with a history of atrial fibrillation, not an anticoagulation because of life-threatening gastro-intestinal bleeding within the last 2 years, taken off of Coumadin, COPD, admitted due for fecal emesis for the last 24 hourss/p EXPLORATORY LAPAROTOMY, SMALL BOWEL RESECTION, REPAIR OF INCARCERATED HERNIA on 04/05/15 and extubated 9/4    PT Comments    Patient aroused but not following commands, total assist for all mobility.  Follow Up Recommendations  SNF     Equipment Recommendations  None recommended by PT    Recommendations for Other Services       Precautions / Restrictions Precautions Precautions: Fall Precaution Comments: monitor VS, NG tube, abd incision    Mobility  Bed Mobility Overal bed mobility: Needs Assistance Bed Mobility: Supine to Sit;Sit to Supine Rolling: Total assist;+2 for physical assistance;+2 for safety/equipment   Supine to sit: Total assist;+2 for physical assistance;+2 for safety/equipment Sit to supine: Total assist;+2 for physical assistance;+2 for safety/equipment   General bed mobility comments: assisted to sitting at the edge f bed, and back into bed. patient provided no assist. HR < 95, Sats > 93, BP 159/81  RR 30  Transfers                    Ambulation/Gait                 Stairs            Wheelchair Mobility    Modified Rankin (Stroke Patients Only)       Balance   Sitting-balance support: Feet supported;Bilateral upper extremity supported Sitting balance-Leahy Scale: Poor Sitting balance - Comments: requiring assist for trunk, sat x 10 minutes, VSS.                            Cognition Arousal/Alertness: Lethargic                     General Comments: does not FC, does not answer.     Exercises      General  Comments        Pertinent Vitals/Pain Pain Assessment: Faces Faces Pain Scale: No hurt    Home Living                      Prior Function            PT Goals (current goals can now be found in the care plan section) Progress towards PT goals: Not progressing toward goals - comment (remains  lethargic and very weak)    Frequency  Min 3X/week    PT Plan Current plan remains appropriate    Co-evaluation             End of Session   Activity Tolerance: Patient limited by fatigue;Patient limited by lethargy Patient left: in bed;with call bell/phone within reach;with bed alarm set;with family/visitor present     Time: 1346-1419 PT Time Calculation (min) (ACUTE ONLY): 33 min  Charges:  $Therapeutic Activity: 23-37 mins                    G Codes:      Rada Hay 04/11/2015, 2:42 PM Blanchard Kelch PT [phone]

## 2015-04-11 NOTE — Progress Notes (Signed)
PARENTERAL NUTRITION CONSULT NOTE - Follow up  Pharmacy Consult for TPN Indication: Prolonged Ileus  Allergies  Allergen Reactions  . Codeine Nausea And Vomiting  . Morphine And Related Nausea And Vomiting  . Promethazine Hcl     hallucinations    Patient Measurements: Height: 5\' 5"  (165.1 cm) Weight: 112 lb 7 oz (51 kg) IBW/kg (Calculated) : 57  Vital Signs: Temp: 99.3 F (37.4 C) (09/08 0735) Temp Source: Axillary (09/08 0735) BP: 135/79 mmHg (09/08 1000) Pulse Rate: 80 (09/08 1000) Intake/Output from previous day: 09/07 0701 - 09/08 0700 In: 3138.4 [I.V.:1581.8; IV Piggyback:1156.7; TPN:400] Out: 751 [Urine:600; Emesis/NG output:150; Stool:1]  Labs:  Recent Labs  04/10/15 0340 04/11/15 0543  WBC 7.8 9.5  HGB 9.9* 10.1*  HCT 29.7* 30.3*  PLT 223 240     Recent Labs  04/10/15 0340 04/10/15 1450 04/11/15 0543  NA 138 138 137  K 2.6* 5.2* 4.5  CL 106 108 107  CO2 26 22 24   GLUCOSE 128* 131* 145*  BUN 13 13 10   CREATININE <0.30* 0.42* 0.38*  CALCIUM 7.8* 8.5* 8.2*  MG 1.3*  --  1.8  PHOS 1.9*  --  2.6  PROT  --   --  5.5*  ALBUMIN  --   --  2.9*  AST  --   --  29  ALT  --   --  33  ALKPHOS  --   --  69  BILITOT  --   --  0.8  PREALBUMIN  --   --  9.3*  TRIG  --   --  103   Estimated Creatinine Clearance: 41.4 mL/min (by C-G formula based on Cr of 0.38).    Recent Labs  04/10/15 2335 04/11/15 0358 04/11/15 0753  GLUCAP 149* 138* 162*   Insulin Requirements in the past 24 hours: 3 units sensitive Novolog SSI  Current Nutrition: NG tube clamped, clear liquid diet (9/8)  IVF: D5 NS KCl 40 meq/L @ 45 ml/hr  Central access: PICC  TPN start date: 9/7  ASSESSMENT                                                                                                          HPI: 15 yoF presents to ED on 9/2 with intractable N/V. PMH includes afib, COPD, recurrent bowel obstruction and hernia surgery. CT (9/2) shows SBO from incarcerated hernia, as  well as bilateral pneumonia or aspiration.  NG tube placed, surgery is consulted, s/p OR on 9/2 for hernia and SBO.  She has not had any stool or flatus as of 9/7, still has ileus, and is unable to tolerate enteral feedings.  Pharmacy is consulted to start TPN.  High risk of refeeding, pt has had poor PO intake for several weeks, NPO since admission, and has electrolyte abnormalities.  Significant events:  9/2 s/p OR for exp lap, small bowel resection, hernia repair, peritoneal closure. 9/7 Remains unable to tolerate enteral feedings, TPN ordered.  Today:   Glucose: at goal < 150's  Electrolytes: Na, Mag, Phos and K  WNL (aggressive KCl replacement 9/7 prior to TPN start with increase up to 5.2, now WNL at 4.5)  Corr Ca 9.08  Renal: SCr remains low.  LFTs:  AST/ALT, Tbili, and Alk Phos WNL. (9/8)  TGs: 103 (9/8)  Prealbumin:  9.3 (9/8)  NUTRITIONAL GOALS                                                                                             RD recs (9/6): 75-85 g protein/day, 1550-1750 kcal/day Clinimix 5/15 at a goal rate of 70 ml/hr + 20% fat emulsion at 10 ml/hr to provide: 84 g/day protein, 1672 Kcal/day.  PLAN                                                                                                                          At 1800 today:  Increase to Clinimix E 5/15 at 70 ml/hr.  Continue 20% fat emulsion at 10 ml/hr.  TPN to contain standard multivitamins and trace elements.  Reduce IVF to [facility].  Continue CBGs and sensitive SSI q8h  TPN lab panels on Mondays & Thursdays.  F/u daily.  If pt tolerates oral intake, recommend maximizing enteral supplements as able and d/c TPN.   Gretta Arab PharmD, BCPS Pager [phone] 04/11/2015 11:40 AM

## 2015-04-11 NOTE — Progress Notes (Addendum)
[facility] PROGRESS NOTE  Emily Wyatt:[id number] DOB: 1927-07-08 DOA: 04/05/2015 PCP: Pearson Grippe, MD  Assessment/Plan: 1. Post operative delirium- patient developed delirium post op. Will continue with haldol prn. 2. Acute respiratory failure- resolved, she is on ceftriaxone  day 6/7 for possible aspiration pneumonitis. Will discontinue  Rocephin after the last dose in am. Continue oxygen prn. 3. Hypokalemia- potassium replaced, today potassium is 4.5 4. Chronic atrial fibrillation- continue Labetolol 10 mg q 2 hr prn, patient is not on anticoagulation. 5. Malnutrition- started TNA via picc line. 6. Anemia- post op, hemoglobin is stable at 10.1  Code Status: DNR Family Communication: No family at bedside Disposition Plan: Likely SNF   Consultants:  CCM  Surgery   Procedures:  S/P Laparotomy, small bowel resection, repair of incarcerated obturator hernia with Flex HD mesh and peritoneal closure.  Antibiotics:  Ceftriaxone  HPI/Subjective: 81 year old female admitted on 9-16 with abdominal pain and distention with vomiting. At that time she was found to have incarcerated hernia and small bowel obstruction. Patient underwent exploratory laparotomy on 9-16 by Dr. Johna Sheriff. At that time she had a small bowel resection, repair of incarcerated obturator hernia. Patient was extubated in the ICU postextubation she has been confused. Patient also had increased work of breathing, found to have possible pneumonia started on antibiotic for aspiration pneumonia.  At this time patient continues to be confused, she is alert but not oriented 3.  Objective: Filed Vitals:   04/11/15 1140  BP:   Pulse:   Temp: 99.2 F (37.3 C)  Resp:     Intake/Output Summary (Last 24 hours) at 04/11/15 1408 Last data filed at 04/11/15 1100  Gross per 24 hour  Intake 2558.67 ml  Output   1526 ml  Net 1032.67 ml   Filed Weights   04/05/15 1103 04/05/15 2300 04/07/15 0400  Weight:  44.906 kg (99 lb) 50.1 kg (110 lb 7.2 oz) 51 kg (112 lb 7 oz)    Exam:   General:  Appears in no acute distress, confused  Cardiovascular: S1-S2 is regular  Respiratory: Clear to auscultation bilaterally  Abdomen: Soft, nontender, no organomegaly  Musculoskeletal: No cyanosis/clubbing/edema of the lower extremities   Data Reviewed: Basic Metabolic Panel:  Recent Labs Lab 04/05/15 2330  04/08/15 0348 04/09/15 0346 04/10/15 0340 04/10/15 1450 04/11/15 0543  NA  --   < > 139 136 138 138 137  K  --   < > 3.0* 3.5 2.6* 5.2* 4.5  CL  --   < > 105 103 106 108 107  CO2  --   < > 24 20* 26 22 24   GLUCOSE  --   < > 91 116* 128* 131* 145*  BUN  --   < > 16 11 13 13 10   CREATININE  --   < > 0.45 0.43* <0.30* 0.42* 0.38*  CALCIUM  --   < > 8.3* 8.2* 7.8* 8.5* 8.2*  MG 1.5*  --   --   --  1.3*  --  1.8  PHOS  --   --   --   --  1.9*  --  2.6  < > = values in this interval not displayed. Liver Function Tests:  Recent Labs Lab 04/05/15 1157 04/06/15 0546 04/07/15 0356 04/08/15 0348 04/11/15 0543  AST 103* 55* 68* 44* 29  ALT 94* 59* 65* 60* 33  ALKPHOS 95 62 75 83 69  BILITOT 1.5* 1.8* 4.0* 1.7* 0.8  PROT 7.5 5.3* 5.1* 5.8* 5.5*  ALBUMIN 4.4 2.9* 2.7* 2.9* 2.9*   No results for input(s): LIPASE, AMYLASE in the last 168 hours. No results for input(s): AMMONIA in the last 168 hours. CBC:  Recent Labs Lab 04/06/15 0546 04/07/15 0356 04/08/15 0348 04/10/15 0340 04/11/15 0543  WBC 4.3 5.1 6.6 7.8 9.5  NEUTROABS  --  4.4 5.4  --  7.2  HGB 9.9* 9.3* 9.8* 9.9* 10.1*  HCT 30.9* 29.2* 30.6* 29.7* 30.3*  MCV 95.7 97.7 96.8 93.1 94.1  PLT 157 155 185 223 240   Cardiac Enzymes:  Recent Labs Lab 04/05/15 1824 04/05/15 2330 04/06/15 0546  TROPONINI 0.03 0.05* 0.04*   BNP (last 3 results) No results for input(s): BNP in the last 8760 hours.  ProBNP (last 3 results) No results for input(s): PROBNP in the last 8760 hours.  CBG:  Recent Labs Lab 04/07/15 0748  04/10/15 2335 04/11/15 0358 04/11/15 0753 04/11/15 1153  GLUCAP 83 149* 138* 162* 114*    Recent Results (from the past 240 hour(s))  MRSA PCR Screening     Status: None   Collection Time: 04/05/15 10:39 PM  Result Value Ref Range Status   MRSA by PCR NEGATIVE NEGATIVE Final    Comment:        The GeneXpert MRSA Assay (FDA approved for NASAL specimens only), is one component of a comprehensive MRSA colonization surveillance program. It is not intended to diagnose MRSA infection nor to guide or monitor treatment for MRSA infections.      Studies: Dg Chest Port 1 View  04/10/2015   CLINICAL DATA:  Confirm PICC line placement  EXAM: PORTABLE CHEST - 1 VIEW  COMPARISON:  04/08/2015  FINDINGS: Right PICC line identified with tip to cavoatrial junction. No pneumothorax.  Stable cardiac enlargement and aortic calcification. NG tube noted with side hole catheter above the gastroesophageal junction and tip just beyond the gastroesophageal junction. CT-diffuse interstitial prominence stable. Right lower lobe infiltrate and left lower lobe consolidation stable.  IMPRESSION: Right PICC line as described.  Orogastric tube has been pulled back with side hole above the gastroesophageal junction.   Electronically Signed   By: Esperanza Heir M.D.   On: 04/10/2015 20:34   Dg Abd Portable 1v  04/11/2015   CLINICAL DATA:  Postoperative ileus.  EXAM: PORTABLE ABDOMEN - 1 VIEW  COMPARISON:  April 07, 2015.  FINDINGS: The bowel gas pattern is normal. Distal tip of nasogastric tube is in expected position of stomach and has been advanced more distally since prior exam. Midline surgical staples are noted. Status post cholecystectomy. Phlebolith is seen in right side of pelvis.  IMPRESSION: No evidence of bowel obstruction or ileus. Nasogastric tube in grossly good position.   Electronically Signed   By: Lupita Raider, M.D.   On: 04/11/2015 07:07    Scheduled Meds: . antiseptic oral rinse  7 mL Mouth  Rinse QID  . bisacodyl  10 mg Rectal BID  . cefTRIAXone (ROCEPHIN)  IV  1 g Intravenous Q24H  . chlorhexidine gluconate  15 mL Mouth Rinse BID  . famotidine (PEPCID) IV  20 mg Intravenous Q12H  . insulin aspart  0-9 Units Subcutaneous Q8H  . sodium chloride  10-40 mL Intracatheter Q12H  . sodium chloride  3 mL Intravenous Q12H   Continuous Infusions: . dextrose 5 % and 0.9 % NaCl with KCl 40 mEq/L 45 mL/hr at 04/11/15 0814  . dextrose 5 % and 0.9 % NaCl with KCl 40 mEq/L    . Marland KitchenTPN (  CLINIMIX-E) Adult 30 mL/hr at 04/10/15 2100   And  . fat emulsion 220 mL (04/10/15 2100)  . Marland KitchenTPN (CLINIMIX-E) Adult     And  . fat emulsion      Principal Problem:   Incarcerated obturator hernia with SBO Active Problems:   Atrial fibrillation   Chronic asthma   Chronic respiratory failure   SBO (small bowel obstruction)   Incarcerated hernia   Acute respiratory failure   Aspiration pneumonia    Time spent: 25 min    [facility]  [facility] Pager [phone]. If 7PM-7AM, please contact night-coverage at [email or web address], password [facility] 04/11/2015, 2:08 PM  LOS: 6 days

## 2015-04-11 NOTE — Progress Notes (Signed)
6 Days Post-Op  Subjective: Feeble.  Deconditioned.  Confused.  Not agitated. Had 1 stool. NG volume low. Afebrile.  Hemodynamically stable.  Potassium went from 2.6-5.2 yesterday after IV runs.  Morning lab pending.  BUN 13.  Creatinine 0.42.   Objective: Vital signs in last 24 hours: Temp:  [97.7 F (36.5 C)-98.6 F (37 C)] 98.3 F (36.8 C) (09/08 0400) Pulse Rate:  [39-117] 108 (09/07 1500) Resp:  [26-36] 33 (09/07 1500) BP: (134-169)/(83-106) 134/83 mmHg (09/07 1500) SpO2:  [97 %-100 %] 99 % (09/07 1500) Last BM Date:  (PTA)  Intake/Output from previous day: 09/07 0701 - 09/08 0700 In: 1763.4 [I.V.:606.8; IV Piggyback:1156.7] Out: 126 [Urine:125; Stool:1] Intake/Output this shift: Total I/O In: 10 [I.V.:10] Out: -   General appearance: Feeble.  Elderly.  Confused.  Does not appear to be in any physical distress. GI: Abdomen soft.  Active bowel sounds.  Midline wound clean.  Minimally distended, if at all.  Lab Results:  Results for orders placed or performed during the hospital encounter of 04/05/15 (from the past 24 hour(s))  Basic metabolic panel     Status: Abnormal   Collection Time: 04/10/15  2:50 PM  Result Value Ref Range   Sodium 138 135 - 145 mmol/L   Potassium 5.2 (H) 3.5 - 5.1 mmol/L   Chloride 108 101 - 111 mmol/L   CO2 22 22 - 32 mmol/L   Glucose, Bld 131 (H) 65 - 99 mg/dL   BUN 13 6 - 20 mg/dL   Creatinine, Ser 1.61 (L) 0.44 - 1.00 mg/dL   Calcium 8.5 (L) 8.9 - 10.3 mg/dL   GFR calc non Af Amer >60 >60 mL/min   GFR calc Af Amer >60 >60 mL/min   Anion gap 8 5 - 15  Glucose, capillary     Status: Abnormal   Collection Time: 04/10/15 11:35 PM  Result Value Ref Range   Glucose-Capillary 149 (H) 65 - 99 mg/dL   Comment 1 Notify RN    Comment 2 Document in Chart      Studies/Results: Dg Chest Port 1 View  04/10/2015   CLINICAL DATA:  Confirm PICC line placement  EXAM: PORTABLE CHEST - 1 VIEW  COMPARISON:  04/08/2015  FINDINGS: Right PICC line  identified with tip to cavoatrial junction. No pneumothorax.  Stable cardiac enlargement and aortic calcification. NG tube noted with side hole catheter above the gastroesophageal junction and tip just beyond the gastroesophageal junction. CT-diffuse interstitial prominence stable. Right lower lobe infiltrate and left lower lobe consolidation stable.  IMPRESSION: Right PICC line as described.  Orogastric tube has been pulled back with side hole above the gastroesophageal junction.   Electronically Signed   By: Esperanza Heir M.D.   On: 04/10/2015 20:34    . antiseptic oral rinse  7 mL Mouth Rinse QID  . bisacodyl  10 mg Rectal BID  . cefTRIAXone (ROCEPHIN)  IV  1 g Intravenous Q24H  . chlorhexidine gluconate  15 mL Mouth Rinse BID  . famotidine (PEPCID) IV  20 mg Intravenous Q12H  . insulin aspart  0-9 Units Subcutaneous Q8H  . sodium chloride  10-40 mL Intracatheter Q12H  . sodium chloride  3 mL Intravenous Q12H     Assessment/Plan: s/p Procedure(s):  EXPLORATORY LAPAROTOMY, SMALL BOWEL RESECTION, REPAIR OF INCARCERATED HERNIA  POD #6. Laparotomy, small bowel resection, repair of incarcerated obturator hernia with Flex HD mesh and peritoneal closure. Dr. Johna Sheriff Ileus resolving.  Clamp NG and allow clear liquids.  Check  abdominal x-ray. Hopefully we can get NG tube out soon Currently on TNA.Marland Kitchen  PT consult reordered. Needs to mobilize to tolerance  ID.--She is on Rocephin, but I do not think there is any indication for and continuing antibiotics from a surgical standpoint. Advise discontinue antibiotics unless there is a reason from the medical standpoint  Hypokalemia. Repeat bmet this morning.  Postop delirium.  On Haldol.  Atrial fibrillation, not on anticoagulation History of GI bleed 2 years ago History of rectal prolapse History anxiety and depression  @PROBHOSP @  LOS: 6 days    Dravin Lance M 04/11/2015  . .prob

## 2015-04-12 LAB — CBC
HEMATOCRIT: 31.9 % — AB (ref 36.0–46.0)
HEMOGLOBIN: 10.8 g/dL — AB (ref 12.0–15.0)
MCH: 31.9 pg (ref 26.0–34.0)
MCHC: 33.9 g/dL (ref 30.0–36.0)
MCV: 94.1 fL (ref 78.0–100.0)
Platelets: 290 10*3/uL (ref 150–400)
RBC: 3.39 MIL/uL — AB (ref 3.87–5.11)
RDW: 14.3 % (ref 11.5–15.5)
WBC: 10.2 10*3/uL (ref 4.0–10.5)

## 2015-04-12 LAB — GLUCOSE, CAPILLARY
GLUCOSE-CAPILLARY: 133 mg/dL — AB (ref 65–99)
GLUCOSE-CAPILLARY: 145 mg/dL — AB (ref 65–99)
Glucose-Capillary: 121 mg/dL — ABNORMAL HIGH (ref 65–99)
Glucose-Capillary: 126 mg/dL — ABNORMAL HIGH (ref 65–99)
Glucose-Capillary: 130 mg/dL — ABNORMAL HIGH (ref 65–99)
Glucose-Capillary: 144 mg/dL — ABNORMAL HIGH (ref 65–99)

## 2015-04-12 LAB — BASIC METABOLIC PANEL
ANION GAP: 7 (ref 5–15)
BUN: 14 mg/dL (ref 6–20)
CALCIUM: 8.6 mg/dL — AB (ref 8.9–10.3)
CO2: 27 mmol/L (ref 22–32)
Chloride: 102 mmol/L (ref 101–111)
Creatinine, Ser: 0.32 mg/dL — ABNORMAL LOW (ref 0.44–1.00)
GFR calc non Af Amer: >60 mL/min (ref >60)
GLUCOSE: 114 mg/dL — AB (ref 65–99)
POTASSIUM: 4.3 mmol/L (ref 3.5–5.1)
Sodium: 136 mmol/L (ref 135–145)

## 2015-04-12 MED ORDER — FAT EMULSION 20 % IV EMUL
180.0000 mL | INTRAVENOUS | Status: AC
  Administered 2015-04-12: 180 mL via INTRAVENOUS
  Filled 2015-04-12: qty 250

## 2015-04-12 MED ORDER — TRACE MINERALS CR-CU-MN-SE-ZN 10-1000-500-60 MCG/ML IV SOLN
INTRAVENOUS | Status: AC
  Administered 2015-04-12: 18:00:00 via INTRAVENOUS
  Filled 2015-04-12: qty 1680

## 2015-04-12 NOTE — Progress Notes (Signed)
PARENTERAL NUTRITION CONSULT NOTE - Follow up  Pharmacy Consult for TPN Indication: Prolonged Ileus  Allergies  Allergen Reactions  . Codeine Nausea And Vomiting  . Morphine And Related Nausea And Vomiting  . Promethazine Hcl     hallucinations    Patient Measurements: Height: _0  (165.1 cm) Weight: 112 lb 7 oz (51 kg) IBW/kg (Calculated) : 57  Vital Signs: Temp: 98.1 F (36.7 C) (09/09 0357) Temp Source: Oral (09/09 0357) BP: 158/100 mmHg (09/09 0400) Pulse Rate: 115 (09/09 0400) Intake/Output from previous day: 09/08 0701 - 09/09 0700 In: 2084.3 [I.V.:337; NG/GT:90; IV Piggyback:150; TPN:1417.3] Out: 3350 [Urine:3350]  Labs:  Recent Labs  04/10/15 0340 04/11/15 0543 04/12/15 0520  WBC 7.8 9.5 10.2  HGB 9.9* 10.1* 10.8*  HCT 29.7* 30.3* 31.9*  PLT 223 240 290     Recent Labs  04/10/15 0340 04/10/15 1450 04/11/15 0543 04/12/15 0520  NA 138 138 137 136  K 2.6* 5.2* 4.5 4.3  CL 106 108 107 102  CO2 _1 GLUCOSE 128* 131* 145* 114*  BUN _2 CREATININE <0.30* 0.42* 0.38* 0.32*  CALCIUM 7.8* 8.5* 8.2* 8.6*  MG 1.3*  --  1.8  --   PHOS 1.9*  --  2.6  --   PROT  --   --  5.5*  --   ALBUMIN  --   --  2.9*  --   AST  --   --  29  --   ALT  --   --  33  --   ALKPHOS  --   --  69  --   BILITOT  --   --  0.8  --   PREALBUMIN  --   --  9.3*  --   TRIG  --   --  103  --    Estimated Creatinine Clearance: 41.4 mL/min (by C-G formula based on Cr of 0.32).    Recent Labs  04/11/15 1933 04/11/15 2346 04/12/15 0354  GLUCAP 113* 126* 145*   Insulin Requirements in the past 24 hours: 4 units sensitive Novolog SSI  Current Nutrition: NG tube clamped, clear liquid diet (9/8) - little PO intake  IVF: D5 NS KCl 40 meq/L @ KVO  Central access: PICC  TPN start date: 9/7  ASSESSMENT                                                                                                          HPI: 66 yoF presents to ED on 9/2 with intractable  N/V. PMH includes afib, COPD, recurrent bowel obstruction and hernia surgery. CT (9/2) shows SBO from incarcerated hernia, as well as bilateral pneumonia or aspiration.  NG tube placed, surgery is consulted, s/p OR on 9/2 for hernia and SBO.  She has not had any stool or flatus as of 9/7, still has ileus, and is unable to tolerate enteral feedings.  Pharmacy is consulted to start TPN.  High risk of refeeding, pt has had poor PO intake for several weeks, NPO since  admission, and has electrolyte abnormalities.  Significant events:  9/2 s/p OR for exp lap, small bowel resection, hernia repair, peritoneal closure. 9/7 Remains unable to tolerate enteral feedings, TPN ordered.  Today:   Glucose: at goal < 150's  Electrolytes: Na, Mag, Phos and K WNL. Corr Ca WNL  Renal: SCr remains low.  LFTs:  AST/ALT, Tbili, and Alk Phos WNL. (9/8)  TGs: 103 (9/8)  Prealbumin:  9.3 (9/8)  Continue famotidine outside TPN  NUTRITIONAL GOALS                                                                                             RD recs (9/6): 75-85 g protein/day, 1550-1750 kcal/day Clinimix 5/15 at a goal rate of 70 ml/hr + 20% fat emulsion at 7.5 ml/hr to provide: 84 g/day protein, 1553 Kcal/day.  PLAN                                                                                                                          At 1800 today:  Continue Clinimix E 5/15 at 70 ml/hr.  Reduce 20% fat emulsion to 7.5 ml/hr (to keep fat Kcal < 30% of total daily Kcals)  Currently step-down patient so continue lipids  TPN to contain standard multivitamins and trace elements.  Continue CBGs and sensitive SSI q8h  TPN lab panels on Mondays & Thursdays.  F/u daily.  If pt tolerates oral intake, recommend maximizing enteral supplements as able and d/c TPN.   Doreene Eland, PharmD, BCPS.   Pager: [phone] 04/12/2015 7:10 AM

## 2015-04-12 NOTE — Evaluation (Signed)
Clinical/Bedside Swallow Evaluation Patient Details  Name: Emily Wyatt MRN: [id number] Date of Birth: 1927-04-27  Today's Date: 04/12/2015 Time: SLP Start Time (ACUTE ONLY): 0915 SLP Stop Time (ACUTE ONLY): 0958 SLP Time Calculation (min) (ACUTE ONLY): 43 min  Past Medical History:  Past Medical History  Diagnosis Date  . Diverticulitis   . Atrial fibrillation   . LGI bleed   . Anticoagulant long-term use   . Anemia due to blood loss   . Rectal prolapse   . Diverticulosis of colon   . Chronic headaches   . Gallstones   . IBS (irritable bowel syndrome)   . Bowel obstruction   . UTI (lower urinary tract infection)   . Depression   . Anxiety    Past Surgical History:  Past Surgical History  Procedure Laterality Date  . Appendectomy    . Cholescystectomy    . Abdominal hysterectomy    . Small intestine surgery    . Laparoscopy N/A 04/05/2015    Procedure:  EXPLORATORY LAPAROTOMY, SMALL BOWEL RESECTION, REPAIR OF INCARCERATED HERNIA;  Surgeon: Glenna Fellows, MD;  Location: [facility] ORS;  Service: General;  Laterality: N/A;   HPI:  81 yo female adm to [facility] with nausea/vomiting - found to have incarcerated obturator hernia s/p exploratory lap surgery.  Pt with ileus post op, had NG tube - now removed.  Has TPN.  Pt with respiratory issues after extubation and swallow evaluation ordered.  Pt was being treated for aspiration pna due to aspiration of emesis.     Assessment / Plan / Recommendation Clinical Impression  Pt's mental status, extreme lethargy is barrier for po intake at this time.  Per daughter, Emily Wyatt, has been lethargic since this weekend and has pain when awakens.  Then pt receives medicine for pain and is lethargic again.  Daughter reports mother would occasionally cough with liquids for 3 months prior to admission.    SLP provided pt with oral care to help clean mouth of viscous white tinged secretions that she did not sense nor attempt to clear.  Pt became alert during  session x1 and attempted to verbalize to daughter - unintelligible.  1/2 tsp water provided to pt with pt not sealing lips on spoon with excessive oral holding and delayed swallow response.  No overt indication of aspiration, however pt at high risk due to her mental status and gross weakness.  Increased RR noted with intake as well.    Recommend NPO except tsp water boluses when fully alert to help maintain oral hygeine and swallow musculature.  Advised daughter to oral suction pt if oral holding and not eliciting swallow.  Daughter reports concern for aspiration with intake.  Educated daughter to findings and she expressed gratitude and understanding.  SLP fo follow up Monday 04/15/15 for po readiness.      Aspiration Risk  Severe    Diet Recommendation NPO (tsps water after oral care and only when fully alert)   Medication Administration: Via alternative means    Other  Recommendations Oral Care Recommendations: Oral care QID Other Recommendations: Have oral suction available   Follow Up Recommendations       Frequency and Duration min 2x/week  1 week   Pertinent Vitals/Pain Low grade fever, decreased     Swallow Study Prior Functional Status   see hhx    General Date of Onset: 04/12/15 Other Pertinent Information: 81 yo female adm to [facility] with nausea/vomiting - found to have incarcerated obturator hernia s/p exploratory lap  surgery.  Pt with ileus post op, had NG tube - now removed.  Has TPN.  Pt with respiratory issues after extubation and swallow evaluation ordered.  Pt was being treated for aspiration pna due to aspiration of emesis.   Type of Study: Bedside swallow evaluation Diet Prior to this Study: Thin liquids (clears) Respiratory Status: Supplemental O2 delivered via (comment) History of Recent Intubation:  (for surgery) Behavior/Cognition: Lethargic/Drowsy;Distractible;Requires cueing;Doesn't follow directions;Other (Comment) (episodes of pt staring off note) Oral Cavity  - Dentition: Edentulous (pt has dentures at home that she used prior to admit) Self-Feeding Abilities: Total assist Patient Positioning: Upright in bed Baseline Vocal Quality: Aphonic;Low vocal intensity;Breathy (phonation x2 attempted) Volitional Cough: Cognitively unable to elicit Volitional Swallow: Unable to elicit    Oral/Motor/Sensory Function Overall Oral Motor/Sensory Function: Impaired (generalized weakness, pt did not follow commands, open mouth posture noted, pt did not seal lips on spoon)   Ice Chips Ice chips: Not tested   Thin Liquid Thin Liquid: Impaired Presentation: Spoon Oral Phase Impairments: Reduced labial seal;Reduced lingual movement/coordination;Impaired anterior to posterior transit;Poor awareness of bolus Oral Phase Functional Implications: Prolonged oral transit;Oral holding Pharyngeal  Phase Impairments: Suspected delayed Swallow;Decreased hyoid-laryngeal movement    Nectar Thick Nectar Thick Liquid: Not tested   Honey Thick Honey Thick Liquid: Not tested   Puree Puree: Not tested   Solid   GO    Solid: Not tested      Donavan Burnet, MS [facility] SLP [phone]

## 2015-04-12 NOTE — Progress Notes (Signed)
7 Days Post-Op  Subjective: Stable.  Remains feeble, deconditioned, confused, but not agitated.  Not very interactive. Tolerated NG clamping.  NG aspirate with minimal fluid On clear liquid diet but taking very little of this per nursing. 1 stool Abdominal x-ray showed no obstruction  Objective: Vital signs in last 24 hours: Temp:  [97.9 F (36.6 C)-99.3 F (37.4 C)] 98.1 F (36.7 C) (09/09 0357) Pulse Rate:  [76-155] 115 (09/09 0400) Resp:  [19-36] 25 (09/09 0400) BP: (100-176)/(47-118) 158/100 mmHg (09/09 0400) SpO2:  [94 %-100 %] 100 % (09/09 0400) Last BM Date: 04/11/15  Intake/Output from previous day: 09/08 0701 - 09/09 0700 In: 1904.3 [I.V.:327; IV Piggyback:150; TPN:1337.3] Out: 2800 [Urine:2800] Intake/Output this shift: Total I/O In: 1040 [I.V.:100; Other:90; IV Piggyback:50; TPN:800] Out: 1250 [Urine:1250]  General appearance: Awake.  Answers simple questions.  Oriented to person only.  Extremely weak and deconditioned. GI: Abdomen soft.  Nontender.  Midline wound okay.  Hypoactive bowel sounds  Lab Results:  Results for orders placed or performed during the hospital encounter of 04/05/15 (from the past 24 hour(s))  Glucose, capillary     Status: Abnormal   Collection Time: 04/11/15  7:53 AM  Result Value Ref Range   Glucose-Capillary 162 (H) 65 - 99 mg/dL   Comment 1 Notify RN    Comment 2 Document in Chart   Glucose, capillary     Status: Abnormal   Collection Time: 04/11/15 11:53 AM  Result Value Ref Range   Glucose-Capillary 114 (H) 65 - 99 mg/dL   Comment 1 Notify RN    Comment 2 Document in Chart   Glucose, capillary     Status: Abnormal   Collection Time: 04/11/15  4:00 PM  Result Value Ref Range   Glucose-Capillary 138 (H) 65 - 99 mg/dL   Comment 1 Notify RN    Comment 2 Document in Chart   Glucose, capillary     Status: Abnormal   Collection Time: 04/11/15  7:33 PM  Result Value Ref Range   Glucose-Capillary 113 (H) 65 - 99 mg/dL   Comment 1  Notify RN   Glucose, capillary     Status: Abnormal   Collection Time: 04/11/15 11:46 PM  Result Value Ref Range   Glucose-Capillary 126 (H) 65 - 99 mg/dL   Comment 1 Notify RN   Glucose, capillary     Status: Abnormal   Collection Time: 04/12/15  3:54 AM  Result Value Ref Range   Glucose-Capillary 145 (H) 65 - 99 mg/dL  CBC     Status: Abnormal   Collection Time: 04/12/15  5:20 AM  Result Value Ref Range   WBC 10.2 4.0 - 10.5 K/uL   RBC 3.39 (L) 3.87 - 5.11 MIL/uL   Hemoglobin 10.8 (L) 12.0 - 15.0 g/dL   HCT 16.1 (L) 09.6 - 04.5 %   MCV 94.1 78.0 - 100.0 fL   MCH 31.9 26.0 - 34.0 pg   MCHC 33.9 30.0 - 36.0 g/dL   RDW 40.9 81.1 - 91.4 %   Platelets 290 150 - 400 K/uL     Studies/Results: Dg Abd Portable 1v  04/11/2015   CLINICAL DATA:  Postoperative ileus.  EXAM: PORTABLE ABDOMEN - 1 VIEW  COMPARISON:  April 07, 2015.  FINDINGS: The bowel gas pattern is normal. Distal tip of nasogastric tube is in expected position of stomach and has been advanced more distally since prior exam. Midline surgical staples are noted. Status post cholecystectomy. Phlebolith is seen in right side  of pelvis.  IMPRESSION: No evidence of bowel obstruction or ileus. Nasogastric tube in grossly good position.   Electronically Signed   By: Lupita Raider, M.D.   On: 04/11/2015 07:07    . antiseptic oral rinse  7 mL Mouth Rinse QID  . cefTRIAXone (ROCEPHIN)  IV  1 g Intravenous Q24H  . chlorhexidine gluconate  15 mL Mouth Rinse BID  . famotidine (PEPCID) IV  20 mg Intravenous Q12H  . insulin aspart  0-9 Units Subcutaneous Q8H  . sodium chloride  10-40 mL Intracatheter Q12H  . sodium chloride  3 mL Intravenous Q12H     Assessment/Plan: s/p Procedure(s):  EXPLORATORY LAPAROTOMY, SMALL BOWEL RESECTION, REPAIR OF INCARCERATED HERNIA  POD #7. Laparotomy, small bowel resection, repair of incarcerated obturator hernia with Flex HD mesh and peritoneal closure. Dr. Johna Sheriff Ileus resolving.  Will  discontinue NG today. Clear liquid diet.  Patient is taking very little of this. Currently on TNA.Marland Kitchen  PT consult reordered. Needs to mobilize to tolerance  ID.--She is on Rocephin, but I do not think there is any indication for and continuing antibiotics from a surgical standpoint. Advise discontinue antibiotics unless there is a reason from the medical standpoint  Hypokalemia. Repeat bmet this morning.  Postop delirium. On Haldol.  Atrial fibrillation, not on anticoagulation History of GI bleed 2 years ago History of rectal prolapse History anxiety and depression  @PROBHOSP @  LOS: 7 days    Loris Winrow M 04/12/2015  . .prob

## 2015-04-12 NOTE — Progress Notes (Signed)
Nutrition Follow-up  DOCUMENTATION CODES:   Severe malnutrition in context of chronic illness, Underweight  INTERVENTION:  - Continue TPN per pharmacy - RD will continue to monitor  NUTRITION DIAGNOSIS:   Malnutrition related to chronic illness as evidenced by energy intake < or equal to 75% for > or equal to 1 month, severe depletion of muscle mass, severe depletion of body fat. -ongoing  GOAL:   Patient will meet greater than or equal to 90% of their needs -met with TPN  MONITOR:   Diet advancement, Labs, Weight trends, Skin, I & O's, Other (Comment) (TPN regimen)  ASSESSMENT:   81 year old female with a history of atrial fibrillation, not an anticoagulation because of life-threatening gastro-intestinal bleeding within the last 2 years, taken off of Coumadin, COPD, who presents to the ER, brought in by EMS for fecal emesis for the last 24 hours. Patient is unable to provide any history and most of the history is obtained from the patient's daughter lives next door.   9/9 POD #7 ex lap with small bowel resection and repair of incarcerated hernia. NGT was clamped late last night versus early this AM and pt tolerating well. She continues to be NPO with TPN via PICC.  Pt currently receiving Clinimix E 5/15 $Remo'@70'neQgJ$  mL/hr with 20% lipids @ 10 mL/hr. This regimen is providing 84 grams protein and 1673 kcal which is meeting needs. Will monitor labs and weight trends; needs may need to be decreased to better reflect medical course and age.  SLP saw pt earlier this AM and continues to recommend NPO status. TPN indication: prolonged ileus.  Per pharmacy note this AM: At 1800 today:  Continue Clinimix E 5/15 at 70 ml/hr.  Reduce 20% fat emulsion to 7.5 ml/hr (to keep fat Kcal < 30% of total daily Kcals)  Currently step-down patient so continue lipids  TPN to contain standard multivitamins and trace elements.  Continue CBGs and sensitive SSI q8h  TPN lab panels on Mondays &  Thursdays.  F/u daily. If pt tolerates oral intake, recommend maximizing enteral supplements as able and d/c TPN.  Medications reviewed. Labs reviewed; CBGs: 114-162 mg/dL, creatinine low, Ca: 8.6 mg/dL.    9/6 - Pt in room with daughter at bedside. - Unable to obtain any history from patient d/t dementia and confusion.  - Per daughter, pt was not eating that much PTA. Pt would have toast for breakfast, would normally skip lunch or would graze on chips and then would eat a little dinner with her.  - Pt's daughter feels that she eats well with family but by herself she doesn't think to eat. - Pt is currently NPO with NGT for ileus and SBO. Pt may be started on TPN per critical care note.   Diet Order:  Diet clear liquid Room service appropriate?: Yes; Fluid consistency:: Thin TPN (CLINIMIX-E) Adult TPN (CLINIMIX-E) Adult  Skin:  Reviewed, no issues  Last BM:  9/8  Height:   Ht Readings from Last 1 Encounters:  04/05/15 $RemoveB'5\' 5"'gBGnBqTb$  (1.651 m)    Weight:   Wt Readings from Last 1 Encounters:  04/07/15 112 lb 7 oz (51 kg)    Ideal Body Weight:  56.8 kg  BMI:  Body mass index is 18.71 kg/(m^2).  Estimated Nutritional Needs:   Kcal:  1550-1750  Protein:  75-85g  Fluid:  1.7L/day  EDUCATION NEEDS:   No education needs identified at this time     Jarome Matin, RD, LDN Inpatient Clinical Dietitian Pager # [phone]  After hours/weekend pager # [phone]  

## 2015-04-12 NOTE — Care Management Note (Signed)
Case Management Note  Patient Details  Name: RIKO LUMSDEN MRN: [id number] Date of Birth: [birth year]  Subjective/Objective: Transfer from SDU. POD#7 SB resection, w/repair, ileus.NGT clamping, likely d/c.Clerars.TPN  via PICC.PT-SNF. CSW already following.                  Action/Plan:d/c plan SNF.   Expected Discharge Date:   (UNKNOWN)               Expected Discharge Plan:  Skilled Nursing Facility  In-House Referral:  Clinical Social Work  Discharge planning Services  CM Consult  Post Acute Care Choice:  NA Choice offered to:  NA  DME Arranged:    DME Agency:     HH Arranged:    HH Agency:     Status of Service:  In process, will continue to follow  Medicare Important Message Given:    Date Medicare IM Given:    Medicare IM give by:    Date Additional Medicare IM Given:    Additional Medicare Important Message give by:     If discussed at Long Length of Stay Meetings, dates discussed:    Additional Comments:  Lanier Clam, RN 04/12/2015, 12:30 PM

## 2015-04-12 NOTE — Progress Notes (Signed)
[facility] PROGRESS NOTE  Emily Wyatt WUJ:[id number] DOB: 05-06-1927 DOA: 04/05/2015 PCP: Pearson Grippe, MD  Assessment/Plan: 1. Post operative delirium- patient developed delirium post op. Will continue with haldol prn. 2. Acute respiratory failure- resolved, she is on ceftriaxone  Total days of antibiotics Zosyn + rocephin, 7/7 for possible aspiration pneumonitis. Will discontinue  Rocephin. 3. Hypokalemia- potassium replaced, today potassium is 4.5 4. Chronic atrial fibrillation- continue Labetolol 10 mg q 2 hr prn, patient is not on anticoagulation. 5. Malnutrition- started TNA via picc line. 6. Anemia- post op, hemoglobin is stable at 10.1  Code Status: DNR Family Communication: No family at bedside Disposition Plan: Likely SNF   Consultants:  CCM  Surgery   Procedures:  S/P Laparotomy, small bowel resection, repair of incarcerated obturator hernia with Flex HD mesh and peritoneal closure.  Antibiotics:  Ceftriaxone  HPI/Subjective: 81 year old female admitted on 9-16 with abdominal pain and distention with vomiting. At that time she was found to have incarcerated hernia and small bowel obstruction. Patient underwent exploratory laparotomy on 9-16 by Dr. Johna Sheriff. At that time she had a small bowel resection, repair of incarcerated obturator hernia. Patient was extubated in the ICU postextubation she has been confused. Patient also had increased work of breathing, found to have possible pneumonia started on antibiotic for aspiration pneumonia.  At this time patient is more alert, though still confused.  Objective: Filed Vitals:   04/12/15 1150  BP: 135/73  Pulse: 94  Temp: 98.4 F (36.9 C)  Resp: 22    Intake/Output Summary (Last 24 hours) at 04/12/15 1353 Last data filed at 04/12/15 0600  Gross per 24 hour  Intake 1767.34 ml  Output   2450 ml  Net -682.66 ml   Filed Weights   04/05/15 1103 04/05/15 2300 04/07/15 0400  Weight: 44.906 kg (99 lb) 50.1  kg (110 lb 7.2 oz) 51 kg (112 lb 7 oz)    Exam:   General:  Appears in no acute distress, confused  Cardiovascular: S1-S2 is regular  Respiratory: Clear to auscultation bilaterally  Abdomen: Soft, nontender, no organomegaly  Musculoskeletal: No cyanosis/clubbing/edema of the lower extremities   Data Reviewed: Basic Metabolic Panel:  Recent Labs Lab 04/05/15 2330  04/09/15 0346 04/10/15 0340 04/10/15 1450 04/11/15 0543 04/12/15 0520  NA  --   < > 136 138 138 137 136  K  --   < > 3.5 2.6* 5.2* 4.5 4.3  CL  --   < > 103 106 108 107 102  CO2  --   < > 20* 26 22 24 27   GLUCOSE  --   < > 116* 128* 131* 145* 114*  BUN  --   < > 11 13 13 10 14   CREATININE  --   < > 0.43* <0.30* 0.42* 0.38* 0.32*  CALCIUM  --   < > 8.2* 7.8* 8.5* 8.2* 8.6*  MG 1.5*  --   --  1.3*  --  1.8  --   PHOS  --   --   --  1.9*  --  2.6  --   < > = values in this interval not displayed. Liver Function Tests:  Recent Labs Lab 04/06/15 0546 04/07/15 0356 04/08/15 0348 04/11/15 0543  AST 55* 68* 44* 29  ALT 59* 65* 60* 33  ALKPHOS 62 75 83 69  BILITOT 1.8* 4.0* 1.7* 0.8  PROT 5.3* 5.1* 5.8* 5.5*  ALBUMIN 2.9* 2.7* 2.9* 2.9*   No results for input(s): LIPASE, AMYLASE in  the last 168 hours. No results for input(s): AMMONIA in the last 168 hours. CBC:  Recent Labs Lab 04/07/15 0356 04/08/15 0348 04/10/15 0340 04/11/15 0543 04/12/15 0520  WBC 5.1 6.6 7.8 9.5 10.2  NEUTROABS 4.4 5.4  --  7.2  --   HGB 9.3* 9.8* 9.9* 10.1* 10.8*  HCT 29.2* 30.6* 29.7* 30.3* 31.9*  MCV 97.7 96.8 93.1 94.1 94.1  PLT 155 185 223 240 290   Cardiac Enzymes:  Recent Labs Lab 04/05/15 1824 04/05/15 2330 04/06/15 0546  TROPONINI 0.03 0.05* 0.04*   BNP (last 3 results) No results for input(s): BNP in the last 8760 hours.  ProBNP (last 3 results) No results for input(s): PROBNP in the last 8760 hours.  CBG:  Recent Labs Lab 04/11/15 1933 04/11/15 2346 04/12/15 0354 04/12/15 0808 04/12/15 1144   GLUCAP 113* 126* 145* 130* 121*    Recent Results (from the past 240 hour(s))  MRSA PCR Screening     Status: None   Collection Time: 04/05/15 10:39 PM  Result Value Ref Range Status   MRSA by PCR NEGATIVE NEGATIVE Final    Comment:        The GeneXpert MRSA Assay (FDA approved for NASAL specimens only), is one component of a comprehensive MRSA colonization surveillance program. It is not intended to diagnose MRSA infection nor to guide or monitor treatment for MRSA infections.      Studies: Dg Chest Port 1 View  04/10/2015   CLINICAL DATA:  Confirm PICC line placement  EXAM: PORTABLE CHEST - 1 VIEW  COMPARISON:  04/08/2015  FINDINGS: Right PICC line identified with tip to cavoatrial junction. No pneumothorax.  Stable cardiac enlargement and aortic calcification. NG tube noted with side hole catheter above the gastroesophageal junction and tip just beyond the gastroesophageal junction. CT-diffuse interstitial prominence stable. Right lower lobe infiltrate and left lower lobe consolidation stable.  IMPRESSION: Right PICC line as described.  Orogastric tube has been pulled back with side hole above the gastroesophageal junction.   Electronically Signed   By: Esperanza Heir M.D.   On: 04/10/2015 20:34   Dg Abd Portable 1v  04/11/2015   CLINICAL DATA:  Postoperative ileus.  EXAM: PORTABLE ABDOMEN - 1 VIEW  COMPARISON:  April 07, 2015.  FINDINGS: The bowel gas pattern is normal. Distal tip of nasogastric tube is in expected position of stomach and has been advanced more distally since prior exam. Midline surgical staples are noted. Status post cholecystectomy. Phlebolith is seen in right side of pelvis.  IMPRESSION: No evidence of bowel obstruction or ileus. Nasogastric tube in grossly good position.   Electronically Signed   By: Lupita Raider, M.D.   On: 04/11/2015 07:07    Scheduled Meds: . antiseptic oral rinse  7 mL Mouth Rinse QID  . cefTRIAXone (ROCEPHIN)  IV  1 g Intravenous  Q24H  . chlorhexidine gluconate  15 mL Mouth Rinse BID  . famotidine (PEPCID) IV  20 mg Intravenous Q12H  . insulin aspart  0-9 Units Subcutaneous Q8H  . sodium chloride  10-40 mL Intracatheter Q12H  . sodium chloride  3 mL Intravenous Q12H   Continuous Infusions: . dextrose 5 % and 0.9 % NaCl with KCl 40 mEq/L 10 mL/hr at 04/12/15 0600  . Marland KitchenTPN (CLINIMIX-E) Adult     And  . fat emulsion    . Marland KitchenTPN (CLINIMIX-E) Adult 70 mL/hr at 04/12/15 0600   And  . fat emulsion 240 mL (04/12/15 0600)    Principal  Problem:   Incarcerated obturator hernia with SBO Active Problems:   Atrial fibrillation   Chronic asthma   Chronic respiratory failure   SBO (small bowel obstruction)   Incarcerated hernia   Acute respiratory failure   Aspiration pneumonia    Time spent: 25 min    [facility]  [facility] Pager [phone]. If 7PM-7AM, please contact night-coverage at [email or web address], password [facility] 04/12/2015, 1:53 PM  LOS: 7 days

## 2015-04-12 NOTE — Progress Notes (Signed)
Date:  Sept.9, 2016 U.R. performed for needs and level of care. Will continue to follow for Case Management needs.  Charlei Ramsaran, RN, BSN, CCM   [phone] 

## 2015-04-13 LAB — BASIC METABOLIC PANEL
ANION GAP: 5 (ref 5–15)
BUN: 18 mg/dL (ref 6–20)
CALCIUM: 8.5 mg/dL — AB (ref 8.9–10.3)
CO2: 28 mmol/L (ref 22–32)
CREATININE: 0.32 mg/dL — AB (ref 0.44–1.00)
Chloride: 101 mmol/L (ref 101–111)
Glucose, Bld: 127 mg/dL — ABNORMAL HIGH (ref 65–99)
Potassium: 4.6 mmol/L (ref 3.5–5.1)
SODIUM: 134 mmol/L — AB (ref 135–145)

## 2015-04-13 LAB — PHOSPHORUS: PHOSPHORUS: 3.6 mg/dL (ref 2.5–4.6)

## 2015-04-13 LAB — GLUCOSE, CAPILLARY
Glucose-Capillary: 126 mg/dL — ABNORMAL HIGH (ref 65–99)
Glucose-Capillary: 128 mg/dL — ABNORMAL HIGH (ref 65–99)
Glucose-Capillary: 143 mg/dL — ABNORMAL HIGH (ref 65–99)

## 2015-04-13 LAB — MAGNESIUM: MAGNESIUM: 1.9 mg/dL (ref 1.7–2.4)

## 2015-04-13 MED ORDER — MAGNESIUM SULFATE IN D5W 10-5 MG/ML-% IV SOLN
1.0000 g | Freq: Once | INTRAVENOUS | Status: AC
  Administered 2015-04-13: 1 g via INTRAVENOUS
  Filled 2015-04-13: qty 100

## 2015-04-13 MED ORDER — TRACE MINERALS CR-CU-MN-SE-ZN 10-1000-500-60 MCG/ML IV SOLN
INTRAVENOUS | Status: AC
  Administered 2015-04-13: 18:00:00 via INTRAVENOUS
  Filled 2015-04-13: qty 1680

## 2015-04-13 MED ORDER — FAT EMULSION 20 % IV EMUL
180.0000 mL | INTRAVENOUS | Status: DC
  Filled 2015-04-13: qty 200

## 2015-04-13 MED ORDER — FAT EMULSION 20 % IV EMUL
180.0000 mL | INTRAVENOUS | Status: AC
  Administered 2015-04-13: 180 mL via INTRAVENOUS
  Filled 2015-04-13: qty 250

## 2015-04-13 NOTE — Progress Notes (Signed)
[facility] PROGRESS NOTE  Emily Wyatt WGN:[id number] DOB: 1927/06/06 DOA: 04/05/2015 PCP: Pearson Grippe, MD  Assessment/Plan: 1. Post operative delirium- patient developed delirium post op. Will continue with haldol prn. 2. Acute respiratory failure- resolved, she is on ceftriaxone  Total days of antibiotics Zosyn + rocephin, 7/7 for possible aspiration pneumonitis. Will discontinue  Rocephin. 3. Hypokalemia- potassium replaced, today potassium is 4.5 4. Chronic atrial fibrillation- continue Labetolol 10 mg q 2 hr prn, patient is not on anticoagulation. 5. Malnutrition- started TNA via picc line. 6. Anemia- post op, hemoglobin is stable at 10.1  Code Status: DNR Family Communication: Discussed with daughter on phone on 04/12/15 Disposition Plan: Will consider palliative care consultation if no improvement by tomorrow.   Consultants:  CCM  Surgery   Procedures:  S/P Laparotomy, small bowel resection, repair of incarcerated obturator hernia with Flex HD mesh and peritoneal closure.  Antibiotics:  Ceftriaxone  HPI/Subjective: 81 year old female admitted on 9-16 with abdominal pain and distention with vomiting. At that time she was found to have incarcerated hernia and small bowel obstruction. Patient underwent exploratory laparotomy on 9-16 by Dr. Johna Sheriff. At that time she had a small bowel resection, repair of incarcerated obturator hernia. Patient was extubated in the ICU postextubation she has been confused. Patient also had increased work of breathing, found to have possible pneumonia started on antibiotic for aspiration pneumonia.  At this time patient is more alert, nonverbal.  Objective: Filed Vitals:   04/13/15 0444  BP: 122/55  Pulse: 72  Temp: 97.6 F (36.4 C)  Resp: 32    Intake/Output Summary (Last 24 hours) at 04/13/15 1121 Last data filed at 04/13/15 0900  Gross per 24 hour  Intake 2278.55 ml  Output   1650 ml  Net 628.55 ml   Filed Weights   04/05/15 1103 04/05/15 2300 04/07/15 0400  Weight: 44.906 kg (99 lb) 50.1 kg (110 lb 7.2 oz) 51 kg (112 lb 7 oz)    Exam:   General:  Appears in no acute distress, confused  Cardiovascular: S1-S2 is regular  Respiratory: Clear to auscultation bilaterally  Abdomen: Soft, nontender, no organomegaly  Musculoskeletal: No cyanosis/clubbing/edema of the lower extremities   Data Reviewed: Basic Metabolic Panel:  Recent Labs Lab 04/10/15 0340 04/10/15 1450 04/11/15 0543 04/12/15 0520 04/13/15 0610  NA 138 138 137 136 134*  K 2.6* 5.2* 4.5 4.3 4.6  CL 106 108 107 102 101  CO2 GLUCOSE 128* 131* 145* 114* 127*  BUN CREATININE <0.30* 0.42* 0.38* 0.32* 0.32*  CALCIUM 7.8* 8.5* 8.2* 8.6* 8.5*  MG 1.3*  --  1.8  --  1.9  PHOS 1.9*  --  2.6  --  3.6   Liver Function Tests:  Recent Labs Lab 04/07/15 0356 04/08/15 0348 04/11/15 0543  AST 68* 44* 29  ALT 65* 60* 33  ALKPHOS 75 83 69  BILITOT 4.0* 1.7* 0.8  PROT 5.1* 5.8* 5.5*  ALBUMIN 2.7* 2.9* 2.9*   No results for input(s): LIPASE, AMYLASE in the last 168 hours. No results for input(s): AMMONIA in the last 168 hours. CBC:  Recent Labs Lab 04/07/15 0356 04/08/15 0348 04/10/15 0340 04/11/15 0543 04/12/15 0520  WBC 5.1 6.6 7.8 9.5 10.2  NEUTROABS 4.4 5.4  --  7.2  --   HGB 9.3* 9.8* 9.9* 10.1* 10.8*  HCT 29.2* 30.6* 29.7* 30.3* 31.9*  MCV 97.7 96.8 93.1 94.1 94.1  PLT 155 185 223  240 290   Cardiac Enzymes: No results for input(s): CKTOTAL, CKMB, CKMBINDEX, TROPONINI in the last 168 hours. BNP (last 3 results) No results for input(s): BNP in the last 8760 hours.  ProBNP (last 3 results) No results for input(s): PROBNP in the last 8760 hours.  CBG:  Recent Labs Lab 04/12/15 0808 04/12/15 1144 04/12/15 1637 04/12/15 2350 04/13/15 0731  GLUCAP 130* 121* 144* 133* 143*    Recent Results (from the past 240 hour(s))  MRSA PCR Screening     Status: None   Collection Time:  04/05/15 10:39 PM  Result Value Ref Range Status   MRSA by PCR NEGATIVE NEGATIVE Final    Comment:        The GeneXpert MRSA Assay (FDA approved for NASAL specimens only), is one component of a comprehensive MRSA colonization surveillance program. It is not intended to diagnose MRSA infection nor to guide or monitor treatment for MRSA infections.      Studies: No results found.  Scheduled Meds: . antiseptic oral rinse  7 mL Mouth Rinse QID  . chlorhexidine gluconate  15 mL Mouth Rinse BID  . famotidine (PEPCID) IV  20 mg Intravenous Q12H  . insulin aspart  0-9 Units Subcutaneous Q8H  . magnesium sulfate 1 - 4 g bolus IVPB  1 g Intravenous Once  . sodium chloride  10-40 mL Intracatheter Q12H  . sodium chloride  3 mL Intravenous Q12H   Continuous Infusions: . dextrose 5 % and 0.9 % NaCl with KCl 40 mEq/L 10 mL/hr at 04/12/15 0600  . Marland KitchenTPN (CLINIMIX-E) Adult 70 mL/hr at 04/12/15 1753   And  . fat emulsion 180 mL (04/12/15 1753)  . Marland KitchenTPN (CLINIMIX-E) Adult     And  . fat emulsion      Principal Problem:   Incarcerated obturator hernia with SBO Active Problems:   Atrial fibrillation   Chronic asthma   Chronic respiratory failure   SBO (small bowel obstruction)   Incarcerated hernia   Acute respiratory failure   Aspiration pneumonia    Time spent: 25 min    [facility]  [facility] Pager [phone]. If 7PM-7AM, please contact night-coverage at [email or web address], password [facility] 04/13/2015, 11:21 AM  LOS: 8 days

## 2015-04-13 NOTE — Progress Notes (Signed)
PARENTERAL NUTRITION CONSULT NOTE - Follow up  Pharmacy Consult for TPN Indication: Prolonged Ileus  Allergies  Allergen Reactions  . Codeine Nausea And Vomiting  . Morphine And Related Nausea And Vomiting  . Promethazine Hcl     hallucinations    Patient Measurements: Height: 5\' 5"  (165.1 cm) Weight: 112 lb 7 oz (51 kg) IBW/kg (Calculated) : 57  Vital Signs: Temp: 97.6 F (36.4 C) (09/10 0444) Temp Source: Axillary (09/10 0444) BP: 122/55 mmHg (09/10 0444) Pulse Rate: 72 (09/10 0444) Intake/Output from previous day: 09/09 0701 - 09/10 0700 In: 2378.6 [I.V.:260; IV Piggyback:150; TPN:1968.6] Out: 2150 [Urine:2150]  Labs:  Recent Labs  04/11/15 0543 04/12/15 0520  WBC 9.5 10.2  HGB 10.1* 10.8*  HCT 30.3* 31.9*  PLT 240 290     Recent Labs  04/11/15 0543 04/12/15 0520 04/13/15 0610  NA 137 136 134*  K 4.5 4.3 4.6  CL 107 102 101  CO2 24 27 28   GLUCOSE 145* 114* 127*  BUN 10 14 18   CREATININE 0.38* 0.32* 0.32*  CALCIUM 8.2* 8.6* 8.5*  MG 1.8  --  1.9  PHOS 2.6  --  3.6  PROT 5.5*  --   --   ALBUMIN 2.9*  --   --   AST 29  --   --   ALT 33  --   --   ALKPHOS 69  --   --   BILITOT 0.8  --   --   PREALBUMIN 9.3*  --   --   TRIG 103  --   --    Estimated Creatinine Clearance: 41.4 mL/min (by C-G formula based on Cr of 0.32).    Recent Labs  04/12/15 1637 04/12/15 2350 04/13/15 0731  GLUCAP 144* 133* 143*   Insulin Requirements in the past 24 hours: 3 units sensitive Novolog SSI  Current Nutrition: NG tube clamped, clear liquid diet (9/8) - little PO intake  IVF: D5 NS KCl 40 meq/L @ KVO  Central access: PICC  TPN start date: 9/7  ASSESSMENT                                                                                                          HPI: 89 yoF presents to ED on 9/2 with intractable N/V. PMH includes afib, COPD, recurrent bowel obstruction and hernia surgery. CT (9/2) shows SBO from incarcerated hernia, as well as bilateral  pneumonia or aspiration.  NG tube placed, surgery is consulted, s/p OR on 9/2 for hernia and SBO.  She has not had any stool or flatus as of 9/7, still has ileus, and is unable to tolerate enteral feedings.  Pharmacy is consulted to start TPN.  High risk of refeeding, pt has had poor PO intake for several weeks, NPO since admission, and has electrolyte abnormalities.  Significant events:  9/2 s/p OR for exp lap, small bowel resection, hernia repair, peritoneal closure. 9/7 Remains unable to tolerate enteral feedings, TPN ordered. 9/9: NGT d/ced  Today:   Glucose: at goal < 150's  Electrolytes: Na slightly  low at 1.34, Mag 1.9; Phos and K WNL. Corr Ca WNL  Renal: SCr remains low.  LFTs:  AST/ALT, Tbili, and Alk Phos WNL. (9/8)  TGs: 103 (9/8)  Prealbumin:  9.3 (9/8)  Continue famotidine outside TPN  NUTRITIONAL GOALS                                                                                             RD recs (9/6): 75-85 g protein/day, 1550-1750 kcal/day Clinimix 5/15 at a goal rate of 70 ml/hr + 20% fat emulsion at 7.5 ml/hr to provide: 84 g/day protein, 1553 Kcal/day.  PLAN                                                                                                                         1) magnesium sulfate 1gm IV x1 2) At 1800 today:  Continue Clinimix E 5/15 at 70 ml/hr.  continue 20% fat emulsion to 7.5 ml/hr (to keep fat Kcal < 30% of total daily Kcals)  TPN to contain standard multivitamins and trace elements.  Continue CBGs and sensitive SSI q8h  TPN lab panels on Mondays & Thursdays.  F/u daily.  If pt tolerates oral intake, recommend maximizing enteral supplements as able and d/c TPN.   Dia Sitter, PharmD, BCPS 04/13/2015 8:42 AM

## 2015-04-13 NOTE — Progress Notes (Signed)
8 Days Post-Op  Subjective: Stable.  Remains feeble, deconditioned, confused, but not agitated.  Not very interactive. NG out, No BM's recorded   Objective: Vital signs in last 24 hours: Temp:  [97.2 F (36.2 C)-98.4 F (36.9 C)] 97.6 F (36.4 C) (09/10 0444) Pulse Rate:  [72-98] 72 (09/10 0444) Resp:  [20-32] 32 (09/10 0444) BP: (122-152)/(55-99) 122/55 mmHg (09/10 0444) SpO2:  [97 %-98 %] 98 % (09/10 0444) Last BM Date: 04/11/15  Intake/Output from previous day: 09/09 0701 - 09/10 0700 In: 2378.6 [I.V.:260; IV Piggyback:150; ZOX:0960.4] Out: 2150 [Urine:2150] Intake/Output this shift:    General appearance: Awake.  Answers simple questions.  Oriented to person only.  Extremely weak and deconditioned. GI: Abdomen soft.  Nontender.  Midline wound okay.  Mildly distended  Lab Results:  Results for orders placed or performed during the hospital encounter of 04/05/15 (from the past 24 hour(s))  Glucose, capillary     Status: Abnormal   Collection Time: 04/12/15 11:44 AM  Result Value Ref Range   Glucose-Capillary 121 (H) 65 - 99 mg/dL  Glucose, capillary     Status: Abnormal   Collection Time: 04/12/15  4:37 PM  Result Value Ref Range   Glucose-Capillary 144 (H) 65 - 99 mg/dL   Comment 1 Notify RN   Glucose, capillary     Status: Abnormal   Collection Time: 04/12/15 11:50 PM  Result Value Ref Range   Glucose-Capillary 133 (H) 65 - 99 mg/dL  Basic metabolic panel     Status: Abnormal   Collection Time: 04/13/15  6:10 AM  Result Value Ref Range   Sodium 134 (L) 135 - 145 mmol/L   Potassium 4.6 3.5 - 5.1 mmol/L   Chloride 101 101 - 111 mmol/L   CO2 28 22 - 32 mmol/L   Glucose, Bld 127 (H) 65 - 99 mg/dL   BUN 18 6 - 20 mg/dL   Creatinine, Ser 5.40 (L) 0.44 - 1.00 mg/dL   Calcium 8.5 (L) 8.9 - 10.3 mg/dL   GFR calc non Af Amer >60 >60 mL/min   GFR calc Af Amer >60 >60 mL/min   Anion gap 5 5 - 15  Magnesium     Status: None   Collection Time: 04/13/15  6:10 AM   Result Value Ref Range   Magnesium 1.9 1.7 - 2.4 mg/dL  Phosphorus     Status: None   Collection Time: 04/13/15  6:10 AM  Result Value Ref Range   Phosphorus 3.6 2.5 - 4.6 mg/dL  Glucose, capillary     Status: Abnormal   Collection Time: 04/13/15  7:31 AM  Result Value Ref Range   Glucose-Capillary 143 (H) 65 - 99 mg/dL     Studies/Results: No results found.  Marland Kitchen antiseptic oral rinse  7 mL Mouth Rinse QID  . chlorhexidine gluconate  15 mL Mouth Rinse BID  . famotidine (PEPCID) IV  20 mg Intravenous Q12H  . insulin aspart  0-9 Units Subcutaneous Q8H  . magnesium sulfate 1 - 4 g bolus IVPB  1 g Intravenous Once  . sodium chloride  10-40 mL Intracatheter Q12H  . sodium chloride  3 mL Intravenous Q12H     Assessment/Plan: s/p Procedure(s):  EXPLORATORY LAPAROTOMY, SMALL BOWEL RESECTION, REPAIR OF INCARCERATED HERNIA  POD #8. Laparotomy, small bowel resection, repair of incarcerated obturator hernia with Flex HD mesh and peritoneal closure. Dr. Johna Sheriff Ileus seems to be resolving.   Clear liquid diet as tolerated.   Currently on TNA.Marland Kitchen  PT  consult reordered. Needs to mobilize to tolerance  ID.--She is on Rocephin for poss aspiration PNA  Hypokalemia. Resolved currently  Postop delirium. On Haldol.  Atrial fibrillation, not on anticoagulation History of GI bleed 2 years ago History of rectal prolapse History anxiety and depression  @  LOS: 8 days    Emily Elbe C. 04/13/2015  . .prob

## 2015-04-14 ENCOUNTER — Inpatient Hospital Stay (HOSPITAL_COMMUNITY): Payer: Medicare Other

## 2015-04-14 LAB — GLUCOSE, CAPILLARY
GLUCOSE-CAPILLARY: 130 mg/dL — AB (ref 65–99)
Glucose-Capillary: 137 mg/dL — ABNORMAL HIGH (ref 65–99)

## 2015-04-14 MED ORDER — BISACODYL 10 MG RE SUPP
10.0000 mg | Freq: Once | RECTAL | Status: AC
  Administered 2015-04-14: 10 mg via RECTAL
  Filled 2015-04-14: qty 1

## 2015-04-14 MED ORDER — FENTANYL CITRATE (PF) 100 MCG/2ML IJ SOLN
12.5000 ug | INTRAMUSCULAR | Status: DC | PRN
  Administered 2015-04-14 – 2015-04-17 (×4): 12.5 ug via INTRAVENOUS
  Filled 2015-04-14 (×4): qty 2

## 2015-04-14 MED ORDER — FAT EMULSION 20 % IV EMUL
180.0000 mL | INTRAVENOUS | Status: AC
  Administered 2015-04-14: 180 mL via INTRAVENOUS
  Filled 2015-04-14: qty 250

## 2015-04-14 MED ORDER — DILTIAZEM LOAD VIA INFUSION
10.0000 mg | Freq: Once | INTRAVENOUS | Status: AC
  Administered 2015-04-14: 10 mg via INTRAVENOUS
  Filled 2015-04-14: qty 10

## 2015-04-14 MED ORDER — DILTIAZEM HCL 100 MG IV SOLR
5.0000 mg/h | INTRAVENOUS | Status: DC
  Administered 2015-04-14 – 2015-04-17 (×4): 5 mg/h via INTRAVENOUS
  Filled 2015-04-14 (×4): qty 100

## 2015-04-14 MED ORDER — TRACE MINERALS CR-CU-MN-SE-ZN 10-1000-500-60 MCG/ML IV SOLN
INTRAVENOUS | Status: AC
  Administered 2015-04-14: 17:00:00 via INTRAVENOUS
  Filled 2015-04-14: qty 1680

## 2015-04-14 NOTE — Clinical Social Work Note (Addendum)
Clinical Social Work Assessment  Patient Details  Name: Emily Wyatt MRN: [id number] Date of Birth: May 12, 1927  Date of referral:  04/14/15               Reason for consult:  Facility Placement                Permission sought to share information with:  Facility Art therapist granted to share information::  Yes, Verbal Permission Granted  Name::        Agency::     Relationship::     Contact Information:     Housing/Transportation Living arrangements for the past 2 months:  Single Family Home Source of Information:  Adult Children Patient Interpreter Needed:    Criminal Activity/Legal Involvement Pertinent to Current Situation/Hospitalization:    Significant Relationships:  Adult Children, Other Family Members (grandson) Lives with:    Do you feel safe going back to the place where you live?    Need for family participation in patient care:  Yes (Comment)  Care giving concerns:  Pt's daughter is concerned that pt will not go back to herself and be able to talk again   Social Worker assessment / plan:  CSW met with pt, her daughter and grandson at bedside to discuss possible SNF.  CSW explained role and provided SNF process.  CSW encouraged pt's daughter and grandson to discuss pt history.  CSW encouraged pt's daughter and grandson to explore thoughts and feelings related to pt health and rehab.  CSW provided supportive listening and will send information out to SNF's.  Pt's MD met with family while CSW was in the room and discussed possible stroke after surgery since pt is unresponsive.  MD also discontinued Haldol stating that it could cause similar symptoms in the elderly.  MD hopeful that pt will return back to being able to respond to family  Employment status:  Retired Forensic scientist:  Managed Care PT Recommendations:  [place] / Referral to community resources:     Patient/Family's Response to care:  Pt unresponsive  for past couple of days.  Pt's daughter and grandson discussed pt living independently before last week.  Pt had bowl obstruction and required surgery.  Pt was speaking after surgery but a couple of days after that became unresponsive.  Pt lived on her own and her daughter lived across the street. Pt had just started in home therapy (had 5 visits) to help with her balance before hospitalization.  Family discussed pt had xray this morning of her stomach and are awaiting results.  Pt's family discussed pt's history of anxiety and depression before hospitalization.  Pt's family hopeful that if pt comes off of haldol she may regain her speech.   Patient/Family's Understanding of and Emotional Response to Diagnosis, Current Treatment, and Prognosis:  Pt unresponsive and unsure if she understands what is happening around her.  She cannot speak.  Pt's family hopeful that xray, cat scan or coming off of haldol will help pt regain her speech.  Pt's daughter ok with rehab at discharge and hopeful that pt will regain her strength.  First choice is [facility]  Emotional Assessment Appearance:  Appears stated age Attitude/Demeanor/Rapport:  Unresponsive Affect (typically observed):  Unable to Assess Orientation:   (unknown pt is not speaking ) Alcohol / Substance use:    Psych involvement (Current and /or in the community):     Discharge Needs  Concerns to be addressed:    Readmission within  the last 30 days:    Current discharge risk:    Barriers to Discharge:  No Barriers Identified   Carlean Jews, LCSW 04/14/2015, 2:16 PM

## 2015-04-14 NOTE — Progress Notes (Addendum)
9 Days Post-Op  Subjective: Stable.  Remains feeble, deconditioned, confused, but not agitated.  Not very interactive. NG out, No BM's recorded   Objective: Vital signs in last 24 hours: Temp:  [98.1 F (36.7 C)-99.5 F (37.5 C)] 98.1 F (36.7 C) (09/11 0612) Pulse Rate:  [96-99] 96 (09/11 0612) Resp:  [20-21] 20 (09/11 0612) BP: (129-136)/(74-90) 132/90 mmHg (09/11 0612) SpO2:  [99 %-100 %] 99 % (09/11 0612) Last BM Date: 04/11/15  Intake/Output from previous day: 09/10 0701 - 09/11 0700 In: 2230 [I.V.:232; IV Piggyback:200; TPN:1798] Out: 3025 [Urine:3025] Intake/Output this shift:    General appearance: Awake.  Answers simple questions.  Oriented to person only.  Extremely weak and deconditioned. GI: Abdomen soft.  Nontender.  Midline wound okay.  Mildly distended  Lab Results:  Results for orders placed or performed during the hospital encounter of 04/05/15 (from the past 24 hour(s))  Glucose, capillary     Status: Abnormal   Collection Time: 04/13/15  6:05 PM  Result Value Ref Range   Glucose-Capillary 126 (H) 65 - 99 mg/dL  Glucose, capillary     Status: Abnormal   Collection Time: 04/13/15 11:52 PM  Result Value Ref Range   Glucose-Capillary 128 (H) 65 - 99 mg/dL  Glucose, capillary     Status: Abnormal   Collection Time: 04/14/15  7:39 AM  Result Value Ref Range   Glucose-Capillary 137 (H) 65 - 99 mg/dL     Studies/Results: No results found.  Marland Kitchen antiseptic oral rinse  7 mL Mouth Rinse QID  . chlorhexidine gluconate  15 mL Mouth Rinse BID  . famotidine (PEPCID) IV  20 mg Intravenous Q12H  . insulin aspart  0-9 Units Subcutaneous Q8H  . sodium chloride  10-40 mL Intracatheter Q12H  . sodium chloride  3 mL Intravenous Q12H     Assessment/Plan: s/p Procedure(s):  EXPLORATORY LAPAROTOMY, SMALL BOWEL RESECTION, REPAIR OF INCARCERATED HERNIA  POD #9. Laparotomy, small bowel resection, repair of incarcerated obturator hernia with Flex HD mesh and  peritoneal closure. Dr. Johna Sheriff  Ileus seems to be resolving, but still no bowel function.  Will get AXR to evaluate.    Currently on TNA.Marland Kitchen  PT consult reordered. Needs to mobilize to tolerance  ID.--She completed a course of Rocephin for poss aspiration PNA  Hypokalemia. Resolved currently  Postop delirium. On Haldol.  Atrial fibrillation, not on anticoagulation History of GI bleed 2 years ago History of rectal prolapse History anxiety and depression   AXR shows relatively normal small bowel with minimal dilated loops but stool filled colon.  Will try suppository today   @  LOS: 9 days    Psalm Arman C. 04/14/2015  . .prob

## 2015-04-14 NOTE — Progress Notes (Signed)
HR sustaining at 150. PRN labetolol given. HR returned to below 100, but continues to fluctuate up to 140s. Will continue to monitor.   Leonidas Romberg, RN

## 2015-04-14 NOTE — Progress Notes (Signed)
PARENTERAL NUTRITION CONSULT NOTE - Follow up  Pharmacy Consult for TPN Indication: Prolonged Ileus  Allergies  Allergen Reactions  . Codeine Nausea And Vomiting  . Morphine And Related Nausea And Vomiting  . Promethazine Hcl     hallucinations    Patient Measurements: Height: 5\' 5"  (165.1 cm) Weight: 112 lb 7 oz (51 kg) IBW/kg (Calculated) : 57  Vital Signs: Temp: 98.1 F (36.7 C) (09/11 0612) Temp Source: Axillary (09/11 0612) BP: 132/90 mmHg (09/11 0612) Pulse Rate: 96 (09/11 0612) Intake/Output from previous day: 09/10 0701 - 09/11 0700 In: 2230 [I.V.:232; IV Piggyback:200; TPN:1798] Out: 3025 [Urine:3025]  Labs:  Recent Labs  04/12/15 0520  WBC 10.2  HGB 10.8*  HCT 31.9*  PLT 290     Recent Labs  04/12/15 0520 04/13/15 0610  NA 136 134*  K 4.3 4.6  CL 102 101  CO2 27 28  GLUCOSE 114* 127*  BUN 14 18  CREATININE 0.32* 0.32*  CALCIUM 8.6* 8.5*  MG  --  1.9  PHOS  --  3.6   Estimated Creatinine Clearance: 41.4 mL/min (by C-G formula based on Cr of 0.32).    Recent Labs  04/13/15 1805 04/13/15 2352 04/14/15 0739  GLUCAP 126* 128* 137*   Insulin Requirements in the past 24 hours: 3 units sensitive Novolog SSI  Current Nutrition: NG tube clamped, clear liquid diet (9/8) - little PO intake  IVF: D5 NS KCl 40 meq/L @ KVO  Central access: PICC  TPN start date: 9/7  ASSESSMENT                                                                                                          HPI: 32 yoF presents to ED on 9/2 with intractable N/V. PMH includes afib, COPD, recurrent bowel obstruction and hernia surgery. CT (9/2) shows SBO from incarcerated hernia, as well as bilateral pneumonia or aspiration.  NG tube placed, surgery is consulted, s/p OR on 9/2 for hernia and SBO.  She has not had any stool or flatus as of 9/7, still has ileus, and is unable to tolerate enteral feedings.  Pharmacy is consulted to start TPN.  High risk of refeeding, pt has  had poor PO intake for several weeks, NPO since admission, and has electrolyte abnormalities.  Significant events:  9/2 s/p OR for exp lap, small bowel resection, hernia repair, peritoneal closure. 9/7 Remains unable to tolerate enteral feedings, TPN ordered. 9/9: NGT d/ced  Today:   Glucose: at goal < 150's  Electrolytes: Na slightly low at 1.34, Mag 1.9; Phos and K WNL. Corr Ca WNL (9/10)  Renal: SCr remains low.  LFTs:  AST/ALT, Tbili, and Alk Phos WNL. (9/8)  TGs: 103 (9/8)  Prealbumin:  9.3 (9/8)  Continue famotidine outside TPN  NUTRITIONAL GOALS  RD recs (9/6): 75-85 g protein/day, 1550-1750 kcal/day Clinimix 5/15 at a goal rate of 70 ml/hr + 20% fat emulsion at 7.5 ml/hr to provide: 84 g/day protein, 1553 Kcal/day.  PLAN                                                                                                                         At 1800 today:  Continue Clinimix E 5/15 at 70 ml/hr.  continue 20% fat emulsion to 7.5 ml/hr (to keep fat Kcal < 30% of total daily Kcals)  TPN to contain standard multivitamins and trace elements.  Continue CBGs and sensitive SSI q8h  TPN lab panels on Mondays & Thursdays.  F/u daily.  If pt tolerates oral intake, recommend maximizing enteral supplements as able and d/c TPN.   Dia Sitter, PharmD, BCPS 04/14/2015 10:13 AM

## 2015-04-14 NOTE — Progress Notes (Addendum)
[facility] PROGRESS NOTE  Emily Wyatt ZOX:[id number] DOB: Dec 03, 1926 DOA: 04/05/2015 PCP: Pearson Grippe, MD  Assessment/Plan: 1. Post operative delirium- patient continues to be confused and nonverbal. Will obtain CT head to rule out CVA. Will discontinue Haldol at this time. 2. Acute respiratory failure- resolved, patient was started on Zosyn and then later switched to ceftriaxone completed 7 days of therapy for possible aspiration pneumonitis.  3. Hypokalemia- potassium replaced, today potassium is 4.5 4. Chronic atrial fibrillation- continue Labetolol 10 mg q 2 hr prn, patient is not on anticoagulation. 5. Malnutrition- started TNA via picc line. 6. Anemia- post op, hemoglobin is stable at 10.1  Code Status: DNR Family Communication: Discussed with daughter on phone on 04/12/15 Disposition Plan: Discussed with patient's daughter and grandson at bedside. If CT head is negative for stroke and patient doesn't improve in next 24-48 hours will consider palliative care consultation for goals of care.   Consultants:  CCM  Surgery   Procedures:  S/P Laparotomy, small bowel resection, repair of incarcerated obturator hernia with Flex HD mesh and peritoneal closure.  Antibiotics:  Ceftriaxone  HPI/Subjective: 81 year old female admitted on 9-16 with abdominal pain and distention with vomiting. At that time she was found to have incarcerated hernia and small bowel obstruction. Patient underwent exploratory laparotomy on 9-16 by Dr. Johna Sheriff. At that time she had a small bowel resection, repair of incarcerated obturator hernia. Patient was extubated in the ICU postextubation she has been confused. Patient also had increased work of breathing, found to have possible pneumonia started on antibiotic for aspiration pneumonia.  At this time patient continues to be alert, nonverbal. Daughter at bedside.  Objective: Filed Vitals:   04/14/15 1320  BP: 125/75  Pulse: 91  Temp: 98.1 F  (36.7 C)  Resp: 32    Intake/Output Summary (Last 24 hours) at 04/14/15 1339 Last data filed at 04/14/15 1300  Gross per 24 hour  Intake 2230.01 ml  Output   3625 ml  Net -1394.99 ml   Filed Weights   04/05/15 1103 04/05/15 2300 04/07/15 0400  Weight: 44.906 kg (99 lb) 50.1 kg (110 lb 7.2 oz) 51 kg (112 lb 7 oz)    Exam:   General:  Appears in no acute distress, confused  Cardiovascular: S1-S2 is regular  Respiratory: Clear to auscultation bilaterally  Abdomen: Soft, nontender, no organomegaly  Musculoskeletal: No cyanosis/clubbing/edema of the lower extremities   Data Reviewed: Basic Metabolic Panel:  Recent Labs Lab 04/10/15 0340 04/10/15 1450 04/11/15 0543 04/12/15 0520 04/13/15 0610  NA 138 138 137 136 134*  K 2.6* 5.2* 4.5 4.3 4.6  CL 106 108 107 102 101  CO2 26 22 24 27 28   GLUCOSE 128* 131* 145* 114* 127*  BUN 13 13 10 14 18   CREATININE <0.30* 0.42* 0.38* 0.32* 0.32*  CALCIUM 7.8* 8.5* 8.2* 8.6* 8.5*  MG 1.3*  --  1.8  --  1.9  PHOS 1.9*  --  2.6  --  3.6   Liver Function Tests:  Recent Labs Lab 04/08/15 0348 04/11/15 0543  AST 44* 29  ALT 60* 33  ALKPHOS 83 69  BILITOT 1.7* 0.8  PROT 5.8* 5.5*  ALBUMIN 2.9* 2.9*   No results for input(s): LIPASE, AMYLASE in the last 168 hours. No results for input(s): AMMONIA in the last 168 hours. CBC:  Recent Labs Lab 04/08/15 0348 04/10/15 0340 04/11/15 0543 04/12/15 0520  WBC 6.6 7.8 9.5 10.2  NEUTROABS 5.4  --  7.2  --  HGB 9.8* 9.9* 10.1* 10.8*  HCT 30.6* 29.7* 30.3* 31.9*  MCV 96.8 93.1 94.1 94.1  PLT 185 223 240 290   Cardiac Enzymes: No results for input(s): CKTOTAL, CKMB, CKMBINDEX, TROPONINI in the last 168 hours. BNP (last 3 results) No results for input(s): BNP in the last 8760 hours.  ProBNP (last 3 results) No results for input(s): PROBNP in the last 8760 hours.  CBG:  Recent Labs Lab 04/12/15 2350 04/13/15 0731 04/13/15 1805 04/13/15 2352 04/14/15 0739  GLUCAP  133* 143* 126* 128* 137*    Recent Results (from the past 240 hour(s))  MRSA PCR Screening     Status: None   Collection Time: 04/05/15 10:39 PM  Result Value Ref Range Status   MRSA by PCR NEGATIVE NEGATIVE Final    Comment:        The GeneXpert MRSA Assay (FDA approved for NASAL specimens only), is one component of a comprehensive MRSA colonization surveillance program. It is not intended to diagnose MRSA infection nor to guide or monitor treatment for MRSA infections.      Studies: Ct Head Wo Contrast  04/14/2015   CLINICAL DATA:  Behavioral changes. Confusion post extubation. Recent abdominal surgery.  EXAM: CT HEAD WITHOUT CONTRAST  TECHNIQUE: Contiguous axial images were obtained from the base of the skull through the vertex without intravenous contrast.  COMPARISON:  09/26/2004.  FINDINGS: No evidence for acute infarction, hemorrhage, mass lesion, hydrocephalus, or extra-axial fluid. Moderate atrophy. Small vessel disease. Calvarium intact. BILATERAL ocular surgery. Vascular calcification. No sinus or mastoid disease. Similar appearance to priors.  IMPRESSION: Stable chronic changes as described. No acute intracranial findings.   Electronically Signed   By: Elsie Stain M.D.   On: 04/14/2015 10:36   Dg Abd Portable 2v  04/14/2015   CLINICAL DATA:  81 year old female with a history of bowel obstruction secondary to obturator hernia.  EXAM: PORTABLE ABDOMEN - 2 VIEW  COMPARISON:  CT 04/05/2015, plain film 04/07/2015, 04/11/2015  FINDINGS: Gas within small bowel and colon. Formed stool within the rectum and sigmoid. No abnormally distended small bowel or colon.  Compared to plain film 04/03/2015 and the CT 04/05/2015 there has been decreased diameter of loops within the pelvis.  Decubitus image demonstrates no evidence of free air.  No air-fluid levels.  Vascular calcifications throughout the abdomen pelvis.  Surgical staples along the midline pelvis. Surgical suture line within the  low pelvis.  Interval removal of gastric tube.  IMPRESSION: Postoperative changes in this patient status post exploratory laparotomy, small-bowel resection, and incarcerated hernia repair, with no evidence of obstruction.  Vascular calcifications.  Signed,  Yvone Neu. Loreta Ave, DO  [facility]  [facility]   Electronically Signed   By: Gilmer Mor D.O.   On: 04/14/2015 11:22    Scheduled Meds: . antiseptic oral rinse  7 mL Mouth Rinse QID  . chlorhexidine gluconate  15 mL Mouth Rinse BID  . famotidine (PEPCID) IV  20 mg Intravenous Q12H  . insulin aspart  0-9 Units Subcutaneous Q8H  . sodium chloride  10-40 mL Intracatheter Q12H  . sodium chloride  3 mL Intravenous Q12H   Continuous Infusions: . dextrose 5 % and 0.9 % NaCl with KCl 40 mEq/L 10 mL/hr at 04/12/15 0600  . fat emulsion 180 mL (04/13/15 1740)  . Marland KitchenTPN (CLINIMIX-E) Adult     And  . fat emulsion    . Marland KitchenTPN (CLINIMIX-E) Adult 70 mL/hr at 04/13/15 1740    Principal  Problem:   Incarcerated obturator hernia with SBO Active Problems:   Atrial fibrillation   Chronic asthma   Chronic respiratory failure   SBO (small bowel obstruction)   Incarcerated hernia   Acute respiratory failure   Aspiration pneumonia    Time spent: 25 min    [facility] S  [facility] Pager [phone]. If 7PM-7AM, please contact night-coverage at [email or web address], password [facility] 04/14/2015, 1:39 PM  LOS: 9 days

## 2015-04-15 ENCOUNTER — Ambulatory Visit: Payer: 59 | Admitting: Internal Medicine

## 2015-04-15 DIAGNOSIS — R410 Disorientation, unspecified: Secondary | ICD-10-CM

## 2015-04-15 DIAGNOSIS — I4891 Unspecified atrial fibrillation: Secondary | ICD-10-CM

## 2015-04-15 DIAGNOSIS — R4182 Altered mental status, unspecified: Secondary | ICD-10-CM | POA: Insufficient documentation

## 2015-04-15 DIAGNOSIS — I481 Persistent atrial fibrillation: Secondary | ICD-10-CM

## 2015-04-15 LAB — DIFFERENTIAL
BASOS ABS: 0 10*3/uL (ref 0.0–0.1)
Basophils Relative: 0 % (ref 0–1)
Eosinophils Absolute: 0.2 10*3/uL (ref 0.0–0.7)
Eosinophils Relative: 2 % (ref 0–5)
LYMPHS PCT: 6 % — AB (ref 12–46)
Lymphs Abs: 0.7 10*3/uL (ref 0.7–4.0)
MONO ABS: 1 10*3/uL (ref 0.1–1.0)
MONOS PCT: 8 % (ref 3–12)
NEUTROS ABS: 10.2 10*3/uL — AB (ref 1.7–7.7)
Neutrophils Relative %: 84 % — ABNORMAL HIGH (ref 43–77)

## 2015-04-15 LAB — COMPREHENSIVE METABOLIC PANEL
ALBUMIN: 2.9 g/dL — AB (ref 3.5–5.0)
ALK PHOS: 80 U/L (ref 38–126)
ALT: 27 U/L (ref 14–54)
ANION GAP: 6 (ref 5–15)
AST: 29 U/L (ref 15–41)
BUN: 23 mg/dL — ABNORMAL HIGH (ref 6–20)
CHLORIDE: 99 mmol/L — AB (ref 101–111)
CO2: 28 mmol/L (ref 22–32)
Calcium: 8.4 mg/dL — ABNORMAL LOW (ref 8.9–10.3)
Creatinine, Ser: <0.3 mg/dL — ABNORMAL LOW (ref 0.44–1.00)
GLUCOSE: 124 mg/dL — AB (ref 65–99)
POTASSIUM: 4.2 mmol/L (ref 3.5–5.1)
SODIUM: 133 mmol/L — AB (ref 135–145)
Total Bilirubin: 0.6 mg/dL (ref 0.3–1.2)
Total Protein: 5.9 g/dL — ABNORMAL LOW (ref 6.5–8.1)

## 2015-04-15 LAB — TRIGLYCERIDES: TRIGLYCERIDES: 35 mg/dL (ref <150)

## 2015-04-15 LAB — GLUCOSE, CAPILLARY
GLUCOSE-CAPILLARY: 122 mg/dL — AB (ref 65–99)
GLUCOSE-CAPILLARY: 129 mg/dL — AB (ref 65–99)
GLUCOSE-CAPILLARY: 133 mg/dL — AB (ref 65–99)

## 2015-04-15 LAB — CBC
HEMATOCRIT: 31.2 % — AB (ref 36.0–46.0)
HEMOGLOBIN: 10.4 g/dL — AB (ref 12.0–15.0)
MCH: 31.5 pg (ref 26.0–34.0)
MCHC: 33.3 g/dL (ref 30.0–36.0)
MCV: 94.5 fL (ref 78.0–100.0)
Platelets: 347 10*3/uL (ref 150–400)
RBC: 3.3 MIL/uL — AB (ref 3.87–5.11)
RDW: 14.5 % (ref 11.5–15.5)
WBC: 12 10*3/uL — AB (ref 4.0–10.5)

## 2015-04-15 LAB — MAGNESIUM: Magnesium: 1.8 mg/dL (ref 1.7–2.4)

## 2015-04-15 LAB — PREALBUMIN: Prealbumin: 14.2 mg/dL — ABNORMAL LOW (ref 18–38)

## 2015-04-15 LAB — PHOSPHORUS: PHOSPHORUS: 3.1 mg/dL (ref 2.5–4.6)

## 2015-04-15 MED ORDER — FAT EMULSION 20 % IV EMUL
180.0000 mL | INTRAVENOUS | Status: AC
  Administered 2015-04-15: 180 mL via INTRAVENOUS
  Filled 2015-04-15: qty 250

## 2015-04-15 MED ORDER — TRACE MINERALS CR-CU-MN-SE-ZN 10-1000-500-60 MCG/ML IV SOLN
INTRAVENOUS | Status: AC
  Administered 2015-04-15: 19:00:00 via INTRAVENOUS
  Filled 2015-04-15: qty 1680

## 2015-04-15 MED ORDER — BISACODYL 10 MG RE SUPP
10.0000 mg | Freq: Once | RECTAL | Status: AC
  Administered 2015-04-15: 10 mg via RECTAL
  Filled 2015-04-15: qty 1

## 2015-04-15 NOTE — Progress Notes (Signed)
[facility] PROGRESS NOTE  Emily Wyatt WUJ:[id number] DOB: March 22, 1927 DOA: 04/05/2015 PCP: Pearson Grippe, MD  Assessment/Plan: 1. Post operative delirium- patient continues to be confused and nonverbal. CT head was negative for stroke. Patient is not agitated this time. 2. Acute respiratory failure- resolved, patient was started on Zosyn and then later switched to ceftriaxone completed 7 days of therapy for possible aspiration pneumonitis.  3. Hypokalemia- potassium replaced 4. Atrial fibrillation with RVR - patient currently on Cardizem drip. Heart rate controlled.  5. Malnutrition- started TNA via picc line. 6. Anemia- post op, hemoglobin is stable at 10.1  Code Status: DNR Family Communication: Discussed with daughter on phone on 04/12/15 Disposition Plan: Discussed with patient's daughter on phone, patient does not improve in much. Will get palliative care consultation involved to address goals of care.    Consultants:  CCM  Surgery   Procedures:  S/P Laparotomy, small bowel resection, repair of incarcerated obturator hernia with Flex HD mesh and peritoneal closure.  Antibiotics:  Ceftriaxone  HPI/Subjective: 81 year old female admitted on 9-16 with abdominal pain and distention with vomiting. At that time she was found to have incarcerated hernia and small bowel obstruction. Patient underwent exploratory laparotomy on 9-16 by Dr. Johna Sheriff. At that time she had a small bowel resection, repair of incarcerated obturator hernia. Patient was extubated in the ICU postextubation she has been confused. Patient also had increased work of breathing, found to have possible pneumonia started on antibiotic for aspiration pneumonia.  At this time patient continues to be nonverbal and confused. CT head is negative for stroke.   Objective: Filed Vitals:   04/15/15 0600  BP: 104/60  Pulse: 102  Temp:   Resp: 20    Intake/Output Summary (Last 24 hours) at 04/15/15 1205 Last data  filed at 04/15/15 0800  Gross per 24 hour  Intake 2184.83 ml  Output   2525 ml  Net -340.17 ml   Filed Weights   04/05/15 1103 04/05/15 2300 04/07/15 0400  Weight: 44.906 kg (99 lb) 50.1 kg (110 lb 7.2 oz) 51 kg (112 lb 7 oz)    Exam:   General:  Appears in no acute distress, confused  Cardiovascular: S1-S2 is regular  Respiratory: Clear to auscultation bilaterally  Abdomen: Soft, nontender, no organomegaly  Musculoskeletal: No cyanosis/clubbing/edema of the lower extremities   Data Reviewed: Basic Metabolic Panel:  Recent Labs Lab 04/10/15 0340 04/10/15 1450 04/11/15 0543 04/12/15 0520 04/13/15 0610 04/15/15 0600  NA 138 138 137 136 134* 133*  K 2.6* 5.2* 4.5 4.3 4.6 4.2  CL 106 108 107 102 101 99*  CO2 26 22 24 27 28 28   GLUCOSE 128* 131* 145* 114* 127* 124*  BUN 13 13 10 14 18  23*  CREATININE <0.30* 0.42* 0.38* 0.32* 0.32* <0.30*  CALCIUM 7.8* 8.5* 8.2* 8.6* 8.5* 8.4*  MG 1.3*  --  1.8  --  1.9 1.8  PHOS 1.9*  --  2.6  --  3.6 3.1   Liver Function Tests:  Recent Labs Lab 04/11/15 0543 04/15/15 0600  AST 29 29  ALT 33 27  ALKPHOS 69 80  BILITOT 0.8 0.6  PROT 5.5* 5.9*  ALBUMIN 2.9* 2.9*   No results for input(s): LIPASE, AMYLASE in the last 168 hours. No results for input(s): AMMONIA in the last 168 hours. CBC:  Recent Labs Lab 04/10/15 0340 04/11/15 0543 04/12/15 0520 04/15/15 0600  WBC 7.8 9.5 10.2 12.0*  NEUTROABS  --  7.2  --  10.2*  HGB  9.9* 10.1* 10.8* 10.4*  HCT 29.7* 30.3* 31.9* 31.2*  MCV 93.1 94.1 94.1 94.5  PLT 223 240 290 347   Cardiac Enzymes: No results for input(s): CKTOTAL, CKMB, CKMBINDEX, TROPONINI in the last 168 hours. BNP (last 3 results) No results for input(s): BNP in the last 8760 hours.  ProBNP (last 3 results) No results for input(s): PROBNP in the last 8760 hours.  CBG:  Recent Labs Lab 04/13/15 2352 04/14/15 0739 04/14/15 1555 04/15/15 0003 04/15/15 0738  GLUCAP 128* 137* 130* 122* 133*     Recent Results (from the past 240 hour(s))  MRSA PCR Screening     Status: None   Collection Time: 04/05/15 10:39 PM  Result Value Ref Range Status   MRSA by PCR NEGATIVE NEGATIVE Final    Comment:        The GeneXpert MRSA Assay (FDA approved for NASAL specimens only), is one component of a comprehensive MRSA colonization surveillance program. It is not intended to diagnose MRSA infection nor to guide or monitor treatment for MRSA infections.      Studies: Ct Head Wo Contrast  04/14/2015   CLINICAL DATA:  Behavioral changes. Confusion post extubation. Recent abdominal surgery.  EXAM: CT HEAD WITHOUT CONTRAST  TECHNIQUE: Contiguous axial images were obtained from the base of the skull through the vertex without intravenous contrast.  COMPARISON:  09/26/2004.  FINDINGS: No evidence for acute infarction, hemorrhage, mass lesion, hydrocephalus, or extra-axial fluid. Moderate atrophy. Small vessel disease. Calvarium intact. BILATERAL ocular surgery. Vascular calcification. No sinus or mastoid disease. Similar appearance to priors.  IMPRESSION: Stable chronic changes as described. No acute intracranial findings.   Electronically Signed   By: Elsie Stain M.D.   On: 04/14/2015 10:36   Dg Abd Portable 2v  04/14/2015   CLINICAL DATA:  81 year old female with a history of bowel obstruction secondary to obturator hernia.  EXAM: PORTABLE ABDOMEN - 2 VIEW  COMPARISON:  CT 04/05/2015, plain film 04/07/2015, 04/11/2015  FINDINGS: Gas within small bowel and colon. Formed stool within the rectum and sigmoid. No abnormally distended small bowel or colon.  Compared to plain film 04/03/2015 and the CT 04/05/2015 there has been decreased diameter of loops within the pelvis.  Decubitus image demonstrates no evidence of free air.  No air-fluid levels.  Vascular calcifications throughout the abdomen pelvis.  Surgical staples along the midline pelvis. Surgical suture line within the low pelvis.  Interval  removal of gastric tube.  IMPRESSION: Postoperative changes in this patient status post exploratory laparotomy, small-bowel resection, and incarcerated hernia repair, with no evidence of obstruction.  Vascular calcifications.  Signed,  Yvone Neu. Loreta Ave, DO  [facility]  [facility]   Electronically Signed   By: Gilmer Mor D.O.   On: 04/14/2015 11:22    Scheduled Meds: . antiseptic oral rinse  7 mL Mouth Rinse QID  . bisacodyl  10 mg Rectal Once  . chlorhexidine gluconate  15 mL Mouth Rinse BID  . famotidine (PEPCID) IV  20 mg Intravenous Q12H  . insulin aspart  0-9 Units Subcutaneous Q8H  . sodium chloride  10-40 mL Intracatheter Q12H  . sodium chloride  3 mL Intravenous Q12H   Continuous Infusions: . dextrose 5 % and 0.9 % NaCl with KCl 40 mEq/L 10 mL/hr at 04/12/15 0600  . diltiazem (CARDIZEM) infusion 5 mg/hr (04/15/15 0824)  . Marland KitchenTPN (CLINIMIX-E) Adult 70 mL/hr at 04/14/15 1716   And  . fat emulsion 180 mL (04/14/15 1716)  . Marland Kitchen  TPN (CLINIMIX-E) Adult     And  . fat emulsion      Principal Problem:   Incarcerated obturator hernia with SBO Active Problems:   Atrial fibrillation   Chronic asthma   Chronic respiratory failure   SBO (small bowel obstruction)   Incarcerated hernia   Acute respiratory failure   Aspiration pneumonia    Time spent: 25 min    [facility]  [facility] Pager [phone]. If 7PM-7AM, please contact night-coverage at [email or web address], password [facility] 04/15/2015, 12:05 PM  LOS: 10 days

## 2015-04-15 NOTE — Progress Notes (Signed)
Speech Language Pathology Treatment: Dysphagia  Patient Details Name: Emily Wyatt MRN: [id number] DOB: Apr 09, 1927 Today's Date: 04/15/2015 Time: 0950-1000 SLP Time Calculation (min) (ACUTE ONLY): 10 min  Assessment / Plan / Recommendation Clinical Impression  Mental status continues to be largest barrier for ability to consume po.  Open mouth breathing posture noted with dried oral secretions on posterior soft palate.  Despite total verbal, visual cues, pt did not following directions to orally transit water via toothette nor swallow.  Very delayed swallow noted x1 - suspect due to reflexive pharyngeal swallow triggered with moisture, followed by weak, nonproductive cough - concerning for aspiration.  SlP able to clear dried secretions with repeated moisture via toothette and oral suction.  Pt opened her eyes twice during session and attempted to speak x1 - speech was severely dysarthric/unintelligible with decreased phonatory strength.    Concern for swallow ability prognosis present due to continued decreased mental status x several days.  Note plans for possible palliative referral.  SLP to follow up pending care plan.    HPI Other Pertinent Information: 81 yo female adm to [facility] with nausea/vomiting - found to have incarcerated obturator hernia s/p exploratory lap surgery.  Pt with ileus post op, had NG tube - now removed.  Has TPN.  Pt with respiratory issues after extubation and swallow evaluation ordered.  Pt was being treated for aspiration pna due to aspiration of emesis.     Pertinent Vitals Pain Assessment: Faces Faces Pain Scale: No hurt  SLP Plan  Continue with current plan of care    Recommendations Diet recommendations: NPO- single small ice chips only when pt FULLY ALERT  Medication Administration: Via alternative means              Follow up Recommendations: Other (comment) (tbd) Plan: Continue with current plan of care    GO     Mills Koller,  MS [facility] SLP [phone]

## 2015-04-15 NOTE — Progress Notes (Signed)
Nutrition Follow-up  DOCUMENTATION CODES:   Severe malnutrition in context of chronic illness, Underweight  INTERVENTION:  - Continue TPN per pharmacy - RD will continue to monitor  NUTRITION DIAGNOSIS:   Malnutrition related to chronic illness as evidenced by energy intake < or equal to 75% for > or equal to 1 month, severe depletion of muscle mass, severe depletion of body fat. -ongoing  GOAL:   Patient will meet greater than or equal to 90% of their needs -met with TPN  MONITOR:   Diet advancement, Labs, Weight trends, Skin, I & O's, Other (Comment) (TPN regimen)  REASON FOR ASSESSMENT:   Other (Comment) (Low BMI)  ASSESSMENT:   81 year old female with a history of atrial fibrillation, not an anticoagulation because of life-threatening gastro-intestinal bleeding within the last 2 years, taken off of Coumadin, COPD, who presents to the ER, brought in by EMS for fecal emesis for the last 24 hours. Patient is unable to provide any history and most of the history is obtained from the patient's daughter lives next door.   9/12 POD #10 and NGT now removed. Pt has not been seen by SLP since AM of 04/12/15.   She is currently receiving Clinimix E 5/15 @ 70 mL/hr with 20% lipids @ 7.5 mL/hr which is providing 1553 kcal, 84 grams protein which is meeting needs.  No new weight since admission; will continue to monitor for this to determine if needs need to be adjusted. Medications reviewed. Labs reviewed; CBGs: 121-144 mg/dL, Na: 133 mmol/L, Cl: 99 mmol/L, BUN elevated, creatinine low, Ca: 8.4 mg/dL.   9/9 - POD #7 ex lap with small bowel resection and repair of incarcerated hernia.  - NGT was clamped late last night versus early this AM and pt tolerating well.  - She continues to be NPO with TPN via PICC. - Pt currently receiving Clinimix E 5/15 _0  mL/hr with 20% lipids @ 10 mL/hr. This regimen is providing 84 grams protein and 1673 kcal which is meeting needs.  - SLP saw pt  earlier this AM and continues to recommend NPO status. TPN indication: prolonged ileus.  Per pharmacy note this AM: At 1800 today:  Continue Clinimix E 5/15 at 70 ml/hr.  Reduce 20% fat emulsion to 7.5 ml/hr (to keep fat Kcal < 30% of total daily Kcals)  Currently step-down patient so continue lipids  TPN to contain standard multivitamins and trace elements.  Continue CBGs and sensitive SSI q8h  TPN lab panels on Mondays & Thursdays.  F/u daily. If pt tolerates oral intake, recommend maximizing enteral supplements as able and d/c TPN.  9/6 - Pt in room with daughter at bedside. - Unable to obtain any history from patient d/t dementia and confusion.  - Per daughter, pt was not eating that much PTA. Pt would have toast for breakfast, would normally skip lunch or would graze on chips and then would eat a little dinner with her.  - Pt's daughter feels that she eats well with family but by herself she doesn't think to eat. - Pt is currently NPO with NGT for ileus and SBO. Pt may be started on TPN per critical care note.   Diet Order:  Diet NPO time specified Except for: Ice Chips, Other (See Comments) TPN (CLINIMIX-E) Adult TPN (CLINIMIX-E) Adult  Skin:  Reviewed, no issues  Last BM:  9/11  Height:   Ht Readings from Last 1 Encounters:  04/05/15 _1  (1.651 m)    Weight:   Wt Readings  from Last 1 Encounters:  04/07/15 112 lb 7 oz (51 kg)    Ideal Body Weight:  56.8 kg  BMI:  Body mass index is 18.71 kg/(m^2).  Estimated Nutritional Needs:   Kcal:  1550-1750  Protein:  75-85g  Fluid:  1.7L/day  EDUCATION NEEDS:   No education needs identified at this time     Jarome Matin, RD, LDN Inpatient Clinical Dietitian Pager # [phone] After hours/weekend pager # [phone]

## 2015-04-15 NOTE — Progress Notes (Signed)
Physical Therapy Treatment and Discharge from Acute PT Patient Details Name: Emily Wyatt MRN: [id number] DOB: March 23, 1927 Today's Date: 04/15/2015    History of Present Illness 81 year old female with a history of atrial fibrillation, not an anticoagulation because of life-threatening gastro-intestinal bleeding within the last 2 years, taken off of Coumadin, COPD, admitted due for fecal emesis for the last 24 hourss/p EXPLORATORY LAPAROTOMY, SMALL BOWEL RESECTION, REPAIR OF INCARCERATED HERNIA on 04/05/15 and extubated 9/4    PT Comments    Pt continues to remain lethargic.  Pt not following commands or attempting to assist with bed mobility.  Daughter present and states MD ordered palliative consult.  Pt asking questions about meaning and explained palliative team member would be coming to discuss pt's plan of care.  Daughter reports she would like patient to be comfortable however wished to have PT attempt mobility today to see how pt would perform/participate.  After observing pt's inability to cognitively and actively participate, daughter agreeable for PT to sign off at this time.  Daughter aware to ask for re-order if pt's status changes and/or patient would start to benefit.  PT to sign off at this time due to medical decline.   Follow Up Recommendations  Supervision/Assistance - 24 hour  Pt will likely require increased care.     Equipment Recommendations  None recommended by PT    Recommendations for Other Services       Precautions / Restrictions Precautions Precautions: Fall Precaution Comments: abdominal incision    Mobility  Bed Mobility Overal bed mobility: Needs Assistance Bed Mobility: Rolling;Sidelying to Sit;Sit to Sidelying Rolling: Total assist;+2 for physical assistance;+2 for safety/equipment Sidelying to sit: Total assist;+2 for physical assistance;+2 for safety/equipment     Sit to sidelying: +2 for physical assistance;Total assist;+2 for  safety/equipment General bed mobility comments: ulitized bed pad to assist pt with sitting EOB and then returning to supine, pt did not attempt to initiate or assist, not following commands  Transfers                    Ambulation/Gait                 Stairs            Wheelchair Mobility    Modified Rankin (Stroke Patients Only)       Balance Overall balance assessment: Needs assistance Sitting-balance support: Bilateral upper extremity supported;Feet supported Sitting balance-Leahy Scale: Zero                              Cognition Arousal/Alertness: Awake/alert   Overall Cognitive Status: Impaired/Different from baseline Area of Impairment: Following commands               General Comments: pt does not follow any commands, no verbalizations, not responding with yes/no or head shaking, unable to squeeze hand    Exercises      General Comments        Pertinent Vitals/Pain Pain Assessment: Faces Faces Pain Scale: Hurts little more Pain Location: grimacing upon return to supine Pain Descriptors / Indicators: Grimacing Pain Intervention(s): Monitored during session (daughter declined pain meds since pt appears better resting position)    Home Living                      Prior Function            PT Goals (current goals can  now be found in the care plan section) Progress towards PT goals: Not progressing toward goals - comment (daughter would like palliative approach, agreeable for PT to sign off at this time)    Frequency       PT Plan Other (comment) (pt not progressing, not able to participate at this time)    Co-evaluation             End of Session   Activity Tolerance: Patient limited by fatigue;Patient limited by lethargy Patient left: in bed;with call bell/phone within reach;with bed alarm set;with family/visitor present     Time: 1610-9604 PT Time Calculation (min) (ACUTE ONLY): 20  min  Charges:  $Therapeutic Activity: 8-22 mins                    G Codes:      Nolie Bignell,[name] 04/19/15, 3:10 PM Zenovia Jarred, PT, DPT 19-Apr-2015 Pager: [phone]

## 2015-04-15 NOTE — Progress Notes (Signed)
[facility] Progress Note  10 Days Post-Op  Subjective: Not very interactive.  Does not communicate with me.  Does not follow my commands.  Still failing swallow eval, NPO.  Does not appear to be in pain.  No N/V.  One BM recorded.   Objective: Vital signs in last 24 hours: Temp:  [97.6 F (36.4 C)-98.1 F (36.7 C)] 97.7 F (36.5 C) (09/12 0357) Pulse Rate:  [75-102] 102 (09/12 0600) Resp:  [20-32] 20 (09/12 0600) BP: (91-125)/(43-75) 104/60 mmHg (09/12 0600) SpO2:  [98 %-100 %] 99 % (09/12 0357) Last BM Date:  (pre surgery)  Intake/Output from previous day: 09/11 0701 - 09/12 0700 In: 2234.8 [I.V.:319; IV Piggyback:150; TPN:1765.8] Out: 2525 [Urine:2525] Intake/Output this shift:    PE: Gen:  Alert, NAD, pleasant Abd: Soft, NT/ND, no grimace to palpation, +BS, no HSM, incisions C/D/I with staples in place   Lab Results:   Recent Labs  04/15/15 0600  WBC 12.0*  HGB 10.4*  HCT 31.2*  PLT 347   BMET  Recent Labs  04/13/15 0610 04/15/15 0600  NA 134* 133*  K 4.6 4.2  CL 101 99*  CO2 28 28  GLUCOSE 127* 124*  BUN 18 23*  CREATININE 0.32* <0.30*  CALCIUM 8.5* 8.4*   PT/INR No results for input(s): LABPROT, INR in the last 72 hours. CMP     Component Value Date/Time   NA 133* 04/15/2015 0600   K 4.2 04/15/2015 0600   CL 99* 04/15/2015 0600   CO2 28 04/15/2015 0600   GLUCOSE 124* 04/15/2015 0600   BUN 23* 04/15/2015 0600   CREATININE <0.30* 04/15/2015 0600   CALCIUM 8.4* 04/15/2015 0600   PROT 5.9* 04/15/2015 0600   ALBUMIN 2.9* 04/15/2015 0600   AST 29 04/15/2015 0600   ALT 27 04/15/2015 0600   ALKPHOS 80 04/15/2015 0600   BILITOT 0.6 04/15/2015 0600   GFRNONAA NOT CALCULATED 04/15/2015 0600   GFRAA NOT CALCULATED 04/15/2015 0600   Lipase     Component Value Date/Time   LIPASE 22 11/10/2009 2225       Studies/Results: Ct Head Wo Contrast  04/14/2015   CLINICAL DATA:  Behavioral changes. Confusion post extubation. Recent  abdominal surgery.  EXAM: CT HEAD WITHOUT CONTRAST  TECHNIQUE: Contiguous axial images were obtained from the base of the skull through the vertex without intravenous contrast.  COMPARISON:  09/26/2004.  FINDINGS: No evidence for acute infarction, hemorrhage, mass lesion, hydrocephalus, or extra-axial fluid. Moderate atrophy. Small vessel disease. Calvarium intact. BILATERAL ocular surgery. Vascular calcification. No sinus or mastoid disease. Similar appearance to priors.  IMPRESSION: Stable chronic changes as described. No acute intracranial findings.   Electronically Signed   By: Elsie Stain M.D.   On: 04/14/2015 10:36   Dg Abd Portable 2v  04/14/2015   CLINICAL DATA:  81 year old female with a history of bowel obstruction secondary to obturator hernia.  EXAM: PORTABLE ABDOMEN - 2 VIEW  COMPARISON:  CT 04/05/2015, plain film 04/07/2015, 04/11/2015  FINDINGS: Gas within small bowel and colon. Formed stool within the rectum and sigmoid. No abnormally distended small bowel or colon.  Compared to plain film 04/03/2015 and the CT 04/05/2015 there has been decreased diameter of loops within the pelvis.  Decubitus image demonstrates no evidence of free air.  No air-fluid levels.  Vascular calcifications throughout the abdomen pelvis.  Surgical staples along the midline pelvis. Surgical suture line within the low pelvis.  Interval removal of gastric tube.  IMPRESSION: Postoperative changes in  this patient status post exploratory laparotomy, small-bowel resection, and incarcerated hernia repair, with no evidence of obstruction.  Vascular calcifications.  Signed,  Yvone Neu. Loreta Ave, DO  [facility]  [facility]   Electronically Signed   By: Gilmer Mor D.O.   On: 04/14/2015 11:22    Anti-infectives: Anti-infectives    Start     Dose/Rate Route Frequency Ordered Stop   04/08/15 1000  cefTRIAXone (ROCEPHIN) 1 g in dextrose 5 % 50 mL IVPB  Status:  Discontinued     1  g 100 mL/hr over 30 Minutes Intravenous Every 24 hours 04/08/15 0815 04/12/15 1439   04/06/15 0000  piperacillin-tazobactam (ZOSYN) IVPB 3.375 g  Status:  Discontinued     3.375 g 12.5 mL/hr over 240 Minutes Intravenous Every 8 hours 04/05/15 1809 04/08/15 0815   04/05/15 2200  piperacillin-tazobactam (ZOSYN) IVPB 3.375 g  Status:  Discontinued     3.375 g 100 mL/hr over 30 Minutes Intravenous 3 times per day 04/05/15 1802 04/05/15 1805   04/05/15 1630  piperacillin-tazobactam (ZOSYN) IVPB 3.375 g     3.375 g 100 mL/hr over 30 Minutes Intravenous  Once 04/05/15 1623 04/05/15 1754       Assessment/Plan POD #10. Laparotomy, small bowel resection, repair of incarcerated obturator hernia with Flex HD mesh and peritoneal closure. - Dr. Johna Sheriff -Ileus seems to be resolving, but still no bowel function. NG already out.  Will get AXR to evaluate.  -NPO secondary to dysphagia and risk of aspiration, SLP following -Continue TNA. -PT following -AXR shows relatively normal small bowel with minimal dilated loops but stool filled colon.  -Repeat suppository today -WBC 12.0  ID-She completed a course of Rocephin for poss aspiration PNA  Hypokalemia. Resolved currently  Postop delirium. CT yesterday negative for acute findings  Atrial fibrillation, not on anticoagulation History of GI bleed 2 years ago History of rectal prolapse History anxiety and depression       LOS: 10 days    Nonie Hoyer 04/15/2015, 9:46 AM Pager: [phone]

## 2015-04-15 NOTE — Care Management Important Message (Signed)
Important Message  Patient Details  Name: Emily Wyatt MRN: [id number] Date of Birth: 05/25/1927   Medicare Important Message Given:  Yes-second notification given    Haskell Flirt 04/15/2015, 12:15 PMImportant Message  Patient Details  Name: Emily Wyatt MRN: [id number] Date of Birth: 07-13-1927   Medicare Important Message Given:  Yes-second notification given    Haskell Flirt 04/15/2015, 12:15 PM

## 2015-04-15 NOTE — Progress Notes (Signed)
PARENTERAL NUTRITION CONSULT NOTE - Follow up  Pharmacy Consult for TPN Indication: Prolonged Ileus  Allergies  Allergen Reactions  . Codeine Nausea And Vomiting  . Morphine And Related Nausea And Vomiting  . Promethazine Hcl     hallucinations    Patient Measurements: Height: 5\' 5"  (165.1 cm) Weight: 112 lb 7 oz (51 kg) IBW/kg (Calculated) : 57  Vital Signs: Temp: 97.7 F (36.5 C) (09/12 0357) Temp Source: Axillary (09/12 0357) BP: 108/60 mmHg (09/12 0432) Pulse Rate: 98 (09/12 0432) Intake/Output from previous day: 09/11 0701 - 09/12 0700 In: 2234.8 [I.V.:319; IV Piggyback:150; TPN:1765.8] Out: 2525 [Urine:2525]  Labs:  Recent Labs  04/15/15 0600  WBC 12.0*  HGB 10.4*  HCT 31.2*  PLT 347     Recent Labs  04/13/15 0610 04/15/15 0600  NA 134* 133*  K 4.6 4.2  CL 101 99*  CO2 28 28  GLUCOSE 127* 124*  BUN 18 23*  CREATININE 0.32* <0.30*  CALCIUM 8.5* 8.4*  MG 1.9 1.8  PHOS 3.6 3.1  PROT  --  5.9*  ALBUMIN  --  2.9*  AST  --  29  ALT  --  27  ALKPHOS  --  80  BILITOT  --  0.6   CrCl cannot be calculated (Patient has no serum creatinine result on file.).    Recent Labs  04/14/15 1555 04/15/15 0003 04/15/15 0738  GLUCAP 130* 122* 133*   Insulin Requirements in the past 24 hours: 3 units sensitive Novolog SSI  Current Nutrition: NG tube clamped, clear liquid diet (9/8) - little PO intake  IVF: D5 NS KCl 40 meq/L @ KVO  Central access: PICC  TPN start date: 9/7  ASSESSMENT                                                                                                          HPI: 89 yoF presents to ED on 9/2 with intractable N/V. PMH includes afib, COPD, recurrent bowel obstruction and hernia surgery. CT (9/2) shows SBO from incarcerated hernia, as well as bilateral pneumonia or aspiration.  NG tube placed, surgery is consulted, s/p OR on 9/2 for hernia and SBO.  She has not had any stool or flatus as of 9/7, still has ileus, and is  unable to tolerate enteral feedings.  Pharmacy is consulted to start TPN.  High risk of refeeding, pt has had poor PO intake for several weeks, NPO since admission, and has electrolyte abnormalities.  Significant events:  9/2 s/p OR for exp lap, small bowel resection, hernia repair, peritoneal closure. 9/7 Remains unable to tolerate enteral feedings, TPN ordered. 9/9: NGT d/ced  Today:   Glucose: at goal < 150's  Electrolytes: Na slightly low at 133; Mg, Phos, K , CorrCa WNL  Renal: SCr remains low.  LFTs:  AST/ALT, Tbili, and Alk Phos WNL. (9/8)  TGs: wnl  Prealbumin:  Improved; 9.3 >> 14.2  Continue famotidine outside TPN  NUTRITIONAL GOALS  RD recs (9/6): 75-85 g protein/day, 1550-1750 kcal/day Clinimix 5/15 at a goal rate of 70 ml/hr + 20% fat emulsion at 7.5 ml/hr to provide: 84 g/day protein, 1553 Kcal/day.  PLAN                                                                                                                         At 1800 today:  Continue Clinimix E 5/15 at 70 ml/hr  Continue 20% fat emulsion to 7.5 ml/hr (to keep fat Kcal < 30% of total daily Kcals)  TPN to contain standard multivitamins and trace elements.  Continue CBGs and sensitive SSI q8h  TPN lab panels on Mondays & Thursdays.  F/u daily.  If pt tolerates oral intake, recommend maximizing enteral supplements as able and d/c TPN.  Reuel Boom, PharmD, BCPS Pager: [phone] 04/15/2015, 8:37 AM

## 2015-04-15 NOTE — Progress Notes (Signed)
   04/15/15 1000  Clinical Encounter Type  Visited With Patient  Visit Type Follow-up  Referral From Nurse;Family  Consult/Referral To Chaplain   Chaplain followed up with the patient. Patient was asleep upon Chaplains arrival. Lunette Stands will follow up with the patient and her daughter at a better time.

## 2015-04-16 DIAGNOSIS — Z66 Do not resuscitate: Secondary | ICD-10-CM

## 2015-04-16 DIAGNOSIS — K5669 Other intestinal obstruction: Secondary | ICD-10-CM

## 2015-04-16 DIAGNOSIS — Z515 Encounter for palliative care: Secondary | ICD-10-CM

## 2015-04-16 LAB — GLUCOSE, CAPILLARY
GLUCOSE-CAPILLARY: 121 mg/dL — AB (ref 65–99)
GLUCOSE-CAPILLARY: 124 mg/dL — AB (ref 65–99)
Glucose-Capillary: 127 mg/dL — ABNORMAL HIGH (ref 65–99)

## 2015-04-16 MED ORDER — TRACE MINERALS CR-CU-MN-SE-ZN 10-1000-500-60 MCG/ML IV SOLN
INTRAVENOUS | Status: DC
  Administered 2015-04-16: 18:00:00 via INTRAVENOUS
  Filled 2015-04-16: qty 1680

## 2015-04-16 MED ORDER — FAT EMULSION 20 % IV EMUL
240.0000 mL | INTRAVENOUS | Status: DC
  Administered 2015-04-16: 240 mL via INTRAVENOUS
  Filled 2015-04-16: qty 250

## 2015-04-16 NOTE — Progress Notes (Signed)
11 Days Post-Op  Subjective: No improvement in her mentation at all.  Family in the rooms reports she was having trouble swallowing at home and trouble with memory at home.  Her daughter did most everything for her at home before this surgery.  Her daughter has discussed this with Dr. Sharl Ma and a Palliative consult is in progress.  Objective: Vital signs in last 24 hours: Temp:  [97.8 F (36.6 C)-98.1 F (36.7 C)] 97.8 F (36.6 C) (09/12 2100) Pulse Rate:  [78-101] 101 (09/13 1115) Resp:  [18-22] 20 (09/13 1115) BP: (99-113)/(53-62) 113/53 mmHg (09/13 1115) SpO2:  [97 %-100 %] 97 % (09/13 1115) Last BM Date: 04/16/15 NPO 1975 urine Last BM 04/14/15 Afebrile, VSS No labs today , last WBC 12,000 CXR 04/14/15:  Postoperative changes in this patient status post exploratory laparotomy, small-bowel resection, and incarcerated hernia repair, with no evidence of obstruction. CT head 9/11:  Stable chronic changes as described. No acute intracranial findings.  Intake/Output from previous day: 09/12 0701 - 09/13 0700 In: 3363.9 [I.V.:315; IV Piggyback:100; TPN:2948.9] Out: 1975 [Urine:1975] Intake/Output this shift: Total I/O In: -  Out: 150 [Urine:150]  General appearance: she is not responsive to me.  she is sleeping during bed change after incontience.   Resp: clear to auscultation bilaterally GI: soft, no pain on palpation.  suture line looks fine,, + BS and BM  Lab Results:   Recent Labs  04/15/15 0600  WBC 12.0*  HGB 10.4*  HCT 31.2*  PLT 347    BMET  Recent Labs  04/15/15 0600  NA 133*  K 4.2  CL 99*  CO2 28  GLUCOSE 124*  BUN 23*  CREATININE <0.30*  CALCIUM 8.4*   PT/INR No results for input(s): LABPROT, INR in the last 72 hours.   Recent Labs Lab 04/11/15 0543 04/15/15 0600  AST 29 29  ALT 33 27  ALKPHOS 69 80  BILITOT 0.8 0.6  PROT 5.5* 5.9*  ALBUMIN 2.9* 2.9*     Lipase     Component Value Date/Time   LIPASE 22 11/10/2009 2225      Studies/Results: No results found.  Medications: . antiseptic oral rinse  7 mL Mouth Rinse QID  . chlorhexidine gluconate  15 mL Mouth Rinse BID  . famotidine (PEPCID) IV  20 mg Intravenous Q12H  . insulin aspart  0-9 Units Subcutaneous Q8H  . sodium chloride  10-40 mL Intracatheter Q12H  . sodium chloride  3 mL Intravenous Q12H   . dextrose 5 % and 0.9 % NaCl with KCl 40 mEq/L 10 mL/hr at 04/12/15 0600  . diltiazem (CARDIZEM) infusion 5 mg/hr (04/16/15 1032)  . Marland KitchenTPN (CLINIMIX-E) Adult 70 mL/hr at 04/15/15 1857   And  . fat emulsion 180 mL (04/15/15 1857)  . Marland KitchenTPN (CLINIMIX-E) Adult     And  . fat emulsion      Assessment/Plan Obturator hernia with obstruction, SBO (small bowel obstruction),Small bowel necrosis S/p EXPLORATORY LAPAROTOMY, SMALL BOWEL RESECTION, REPAIR OF INCARCERATED HERNIA, 04/05/15, Dr. Glenna Fellows T POD 11 Acute respiratory issues resolved  Post op delirium Swallowing concerns secondary to mental status AF not on anticoagulation; currently on Cardizem drip Anemia Hx of GI bleed 2 years ago Hx of rectal prolapase IBS, Hx of diverticulosis Hx of anxiety and depression Antibiotics: Day 4 Zosyn DVT: I have added SCD's  Plan:  Family is awaiting Palliative consult, Dr. Sharl Ma has discussed options with daughter on the phone per family report. I think she is doing fine  from the surgery.  Swallow has not been tested further due to he mental status.  We will follow and await Palliative recommendations.      LOS: 11 days    Jalen Oberry 04/16/2015

## 2015-04-16 NOTE — Consult Note (Signed)
Consultation Note Date: 04/16/2015   Patient Name: Emily Wyatt  DOB: 1927-06-12  MRN: [id number]  Age / Sex: 81 y.o., female   PCP: Pearson Grippe, MD Referring Physician: Meredeth Ide, MD  Reason for Consultation: Goals of care, symptom management     Palliative Care Assessment and Plan Summary of Established Goals of Care and Medical Treatment Preferences    Palliative Care Discussion Held Today:    This NP Lorinda Creed reviewed medical records, received report from team, assessed the patient and then meet at the patient's bedside along with her daughter and grand-son and pastor  to discuss diagnosis, prognosis, GOC, EOL wishes disposition and options.  A detailed discussion was had today regarding advanced directives.  Concepts specific to code status, artifical feeding and hydration, continued IV antibiotics and rehospitalization was had.  The difference between a aggressive medical intervention path  and a palliative comfort care path for this patient at this time was had.  Values and goals of care important to patient and family were attempted to be elicited.  Discussed concept of mortality and limitations of medical intervetnions  Concept of Hospice and Palliative Care were discussed  Natural trajectory and expectations at EOL were discussed.  Questions and concerns addressed.  Hard Choices booklet left for review. Family encouraged to call with questions or concerns.  PMT will continue to support holistically.   Primary Decision Maker: daughter  Goals of Care/Code Status/Advance Care Planning:   Code Status: DNR/DNI Continue current medical treatment plan.   After today's discussion family is processing the overall poor prognosis and faces advanced directive decisions and anticipatory care needs. They plan to talk again in the morning with this NP   Psycho-social/Spiritual:   Support System: family  Desire for further Chaplaincy support:  Strong community church  support  Prognosis: days to weeks  Discharge Planning:  Pending family decisions.  Will further discuss with family in the morning.       Chief Complaint: abdominal pain  History of Present Illness:   81 year old female with a history of atrial fibrillation, not on anticoagulation because of life-threatening gastro-intestinal bleeding within the last 2 years, taken off of Coumadin, COPD, who presents to the ER, brought in by EMS for fecal emesis for the last 24 hours.  Patient does have a history of recurrent bowel obstructions and hernia surgeries.  She was found to have Small bowel obstruction secondary to an incarcerated left obturator hernia and aspiration pneumonia.  She has had prior abdominal surgeries. Had rectal prolapse. Underwent laparoscopically assisted sigmoid colectomy and rectopexy in 2010. Laparoscopic cholecystectomy in 2002.  Per family at baseline independent and with quality of life.  Post operative delirium- patient continues to be confused and nonverbal. CT head was negative for stroke. S/p exploratory laparotomy, repair of incarcerated hernia- surgery following. Patient on TNA Acute respiratory failure- resolved, patient was started on Zosyn and then later switched to ceftriaxone completed 7 days of therapy for possible aspiration pneumonitis.  Hypokalemia- potassium replaced Atrial fibrillation with RVR - patient currently on Cardizem drip. Heart rate controlled.  Malnutrition- started TNA via picc line.        Primary Diagnoses  Present on Admission:  . Incarcerated obturator hernia with SBO . Atrial fibrillation . Chronic asthma . Chronic respiratory failure . Incarcerated hernia  Palliative Review of Systems:    -unable to illicit to to decreased cognition, minimally resposnive   I have reviewed the medical record, interviewed the patient and family, and  examined the patient. The following aspects are pertinent.  Past Medical History    Diagnosis Date  . Diverticulitis   . Atrial fibrillation   . LGI bleed   . Anticoagulant long-term use   . Anemia due to blood loss   . Rectal prolapse   . Diverticulosis of colon   . Chronic headaches   . Gallstones   . IBS (irritable bowel syndrome)   . Bowel obstruction   . UTI (lower urinary tract infection)   . Depression   . Anxiety    Social History   Social History  . Marital Status: Widowed    Spouse Name: N/A  . Number of Children: 4  . Years of Education: N/A   Occupational History  . housewife    Social History Main Topics  . Smoking status: Former Smoker -- 0.50 packs/day for 5 years    Types: Cigarettes    Quit date: 08/04/1983  . Smokeless tobacco: Never Used  . Alcohol Use: No  . Drug Use: No  . Sexual Activity:    Partners: Male   Other Topics Concern  . None   Social History Narrative   Family History  Problem Relation Age of Onset  . Colon cancer Sister   . Heart disease Sister   . Stroke Mother    Scheduled Meds: . antiseptic oral rinse  7 mL Mouth Rinse QID  . chlorhexidine gluconate  15 mL Mouth Rinse BID  . famotidine (PEPCID) IV  20 mg Intravenous Q12H  . insulin aspart  0-9 Units Subcutaneous Q8H  . sodium chloride  10-40 mL Intracatheter Q12H  . sodium chloride  3 mL Intravenous Q12H   Continuous Infusions: . dextrose 5 % and 0.9 % NaCl with KCl 40 mEq/L 10 mL/hr at 04/12/15 0600  . diltiazem (CARDIZEM) infusion 5 mg/hr (04/15/15 1500)  . Marland KitchenTPN (CLINIMIX-E) Adult 70 mL/hr at 04/15/15 1857   And  . fat emulsion 180 mL (04/15/15 1857)  . Marland KitchenTPN (CLINIMIX-E) Adult     And  . fat emulsion     PRN Meds:.acetaminophen, fentaNYL (SUBLIMAZE) injection, labetalol, levalbuterol, lip balm, ondansetron **OR** ondansetron (ZOFRAN) IV, sodium chloride Medications Prior to Admission:  Prior to Admission medications   Medication Sig Start Date End Date Taking? Authorizing Provider  acetaminophen (TYLENOL) 325 MG tablet Take 650 mg by  mouth every 6 (six) hours as needed.   Yes Historical Provider, MD  ADVAIR DISKUS 250-50 MCG/DOSE AEPB INHALE ONE PUFF BY MOUTH EVERY 12 HOURS Patient taking differently: Inhale 2 puffs twice daily 02/22/15  Yes Nyoka Cowden, MD  albuterol (PROVENTIL HFA;VENTOLIN HFA) 108 (90 BASE) MCG/ACT inhaler Inhale 2 puffs into the lungs every 4 (four) hours as needed for wheezing or shortness of breath. 07/30/13  Yes Azalia Bilis, MD  aspirin 81 MG tablet Take 81 mg by mouth daily.   Yes Historical Provider, MD  BIOTIN PO Take 1,000 mg by mouth 2 (two) times daily.    Yes Historical Provider, MD  cholecalciferol (VITAMIN D) 1000 UNITS tablet Take 1,000 Units by mouth daily.   Yes Historical Provider, MD  cyanocobalamin 1000 MCG tablet Take 100 mcg by mouth daily.   Yes Historical Provider, MD  cyclobenzaprine (FLEXERIL) 10 MG tablet Take 5 mg by mouth 3 (three) times daily as needed for muscle spasms.   Yes Historical Provider, MD  diltiazem (TIAZAC) 180 MG 24 hr capsule Take 1 capsule (180 mg total) by mouth daily. 09/10/14  Yes Chrystie Nose,  MD  docusate sodium (COLACE) 100 MG capsule Take 100 mg by mouth daily as needed for mild constipation. Stool softenener   Yes Historical Provider, MD  escitalopram (LEXAPRO) 10 MG tablet Take 15 mg by mouth daily.   Yes Historical Provider, MD  Ibuprofen 200 MG CAPS Take 1 capsule by mouth daily as needed.   Yes Historical Provider, MD  naproxen sodium (ANAPROX) 220 MG tablet Take 220 mg by mouth 3 (three) times daily as needed.   Yes Historical Provider, MD  Potassium Gluconate 595 MG CAPS Take 1 capsule by mouth daily.   Yes Historical Provider, MD  Dextromethorphan-Guaifenesin [facility] DM PO) Take by mouth 2 (two) times daily.    Historical Provider, MD   Allergies  Allergen Reactions  . Codeine Nausea And Vomiting  . Morphine And Related Nausea And Vomiting  . Promethazine Hcl     hallucinations   CBC:    Component Value Date/Time   WBC 12.0* 04/15/2015  0600   HGB 10.4* 04/15/2015 0600   HCT 31.2* 04/15/2015 0600   PLT 347 04/15/2015 0600   MCV 94.5 04/15/2015 0600   NEUTROABS 10.2* 04/15/2015 0600   LYMPHSABS 0.7 04/15/2015 0600   MONOABS 1.0 04/15/2015 0600   EOSABS 0.2 04/15/2015 0600   BASOSABS 0.0 04/15/2015 0600   Comprehensive Metabolic Panel:    Component Value Date/Time   NA 133* 04/15/2015 0600   K 4.2 04/15/2015 0600   CL 99* 04/15/2015 0600   CO2 28 04/15/2015 0600   BUN 23* 04/15/2015 0600   CREATININE <0.30* 04/15/2015 0600   GLUCOSE 124* 04/15/2015 0600   CALCIUM 8.4* 04/15/2015 0600   AST 29 04/15/2015 0600   ALT 27 04/15/2015 0600   ALKPHOS 80 04/15/2015 0600   BILITOT 0.6 04/15/2015 0600   PROT 5.9* 04/15/2015 0600   ALBUMIN 2.9* 04/15/2015 0600    Physical Exam:  Vital Signs: BP 108/62 mmHg  Pulse 83  Temp(Src) 97.8 F (36.6 C) (Axillary)  Resp 20  Ht 5\' 5"  (1.651 m)  Wt 51 kg (112 lb 7 oz)  BMI 18.71 kg/m2  SpO2 99% SpO2: SpO2: 99 % O2 Device: O2 Device: Nasal Cannula O2 Flow Rate: O2 Flow Rate (L/min): 2 L/min Intake/output summary:  Intake/Output Summary (Last 24 hours) at 04/16/15 0836 Last data filed at 04/16/15 1610  Gross per 24 hour  Intake 3363.9 ml  Output   2125 ml  Net 1238.9 ml   LBM: Last BM Date: 04/16/15 Baseline Weight: Weight: 44.906 kg (99 lb) Most recent weight: Weight: 51 kg (112 lb 7 oz)  Exam Findings:   General: minimally responsive, cachectic  HEENT: dry buccal membranes, no exudate CVS: RRR Resp: sonorous, decreased in bases Abd: soft, +bs Extrem: without edema Skin: warm and dry           Palliative Performance Scale: 20 %                Additional Data Reviewed: Recent Labs     04/15/15  0600  WBC  12.0*  HGB  10.4*  PLT  347  NA  133*  BUN  23*  CREATININE  <0.30*     Time In: 1500 Time Out: 1630 Time Total: 90 min min  Greater than 50%  of this time was spent counseling and coordinating care related to the above assessment and  plan.  Discussed with  Dr  Sharl Ma  Signed by: Lorinda Creed, NP  Canary Brim, NP  04/16/2015, 8:36 AM  Please contact Palliative Medicine Team phone at [phone] for questions and concerns.   See AMION for contact information

## 2015-04-16 NOTE — Progress Notes (Signed)
Cardizem drip was not restarted from the last bag, I was not aware of this until 10:00am when I went to hang another medication.  Dr. Sharl Ma was notified and safety zone done.  Cardizem drip was restarted per Dr. Sharl Ma at 10:32.  Will continue to monitor patient.

## 2015-04-16 NOTE — Progress Notes (Addendum)
[facility] PROGRESS NOTE  Emily Wyatt:[id number] DOB: Sep 14, 1926 DOA: 04/05/2015 PCP: Pearson Grippe, MD  Assessment/Plan: 1. Post operative delirium- patient continues to be confused and nonverbal. CT head was negative for stroke. Patient is not agitated this time. 2. S/p exploratory laparotomy, repair of incarcerated hernia- surgery following. Patient on TNA 3. Acute respiratory failure- resolved, patient was started on Zosyn and then later switched to ceftriaxone completed 7 days of therapy for possible aspiration pneumonitis.  4. Hypokalemia- potassium replaced 5. Atrial fibrillation with RVR - patient currently on Cardizem drip. Heart rate controlled.  6. Malnutrition- started TNA via picc line. 7. Anemia- post op, hemoglobin is stable at 10.1  Code Status: DNR Family Communication: Discussed with daughter on phone on 04/12/15 Disposition Plan: Discussed with patient's daughter on phone,  palliative care  involved to address goals of care.    Consultants:  CCM  Surgery   Procedures:  S/P Laparotomy, small bowel resection, repair of incarcerated obturator hernia with Flex HD mesh and peritoneal closure.  Antibiotics:  Ceftriaxone  HPI/Subjective: 81 year old female admitted on 9-16 with abdominal pain and distention with vomiting. At that time she was found to have incarcerated hernia and small bowel obstruction. Patient underwent exploratory laparotomy on 9-16 by Dr. Johna Sheriff. At that time she had a small bowel resection, repair of incarcerated obturator hernia. Patient was extubated in the ICU postextubation she has been confused. Patient also had increased work of breathing, found to have possible pneumonia started on antibiotic for aspiration pneumonia.   patient continues to be nonverbal and confused. CT head is negative for stroke. Palliative care  Has been consulted, and meeting is scheduled for today.  Objective: Filed Vitals:   04/16/15 1115  BP: 113/53   Pulse: 101  Temp:   Resp: 20    Intake/Output Summary (Last 24 hours) at 04/16/15 1302 Last data filed at 04/16/15 1229  Gross per 24 hour  Intake 3313.9 ml  Output   2325 ml  Net  988.9 ml   Filed Weights   04/05/15 1103 04/05/15 2300 04/07/15 0400  Weight: 44.906 kg (99 lb) 50.1 kg (110 lb 7.2 oz) 51 kg (112 lb 7 oz)    Exam:   General:  Appears in no acute distress, confused  Cardiovascular: S1-S2 is regular  Respiratory: Clear to auscultation bilaterally  Abdomen: Soft, nontender, no organomegaly  Musculoskeletal: No cyanosis/clubbing/edema of the lower extremities   Data Reviewed: Basic Metabolic Panel:  Recent Labs Lab 04/10/15 0340 04/10/15 1450 04/11/15 0543 04/12/15 0520 04/13/15 0610 04/15/15 0600  NA 138 138 137 136 134* 133*  K 2.6* 5.2* 4.5 4.3 4.6 4.2  CL 106 108 107 102 101 99*  CO2 GLUCOSE 128* 131* 145* 114* 127* 124*  BUN 23*  CREATININE <0.30* 0.42* 0.38* 0.32* 0.32* <0.30*  CALCIUM 7.8* 8.5* 8.2* 8.6* 8.5* 8.4*  MG 1.3*  --  1.8  --  1.9 1.8  PHOS 1.9*  --  2.6  --  3.6 3.1   Liver Function Tests:  Recent Labs Lab 04/11/15 0543 04/15/15 0600  AST 29 29  ALT 33 27  ALKPHOS 69 80  BILITOT 0.8 0.6  PROT 5.5* 5.9*  ALBUMIN 2.9* 2.9*   No results for input(s): LIPASE, AMYLASE in the last 168 hours. No results for input(s): AMMONIA in the last 168 hours. CBC:  Recent Labs Lab 04/10/15 0340 04/11/15 0543 04/12/15 0520 04/15/15 0600  WBC  7.8 9.5 10.2 12.0*  NEUTROABS  --  7.2  --  10.2*  HGB 9.9* 10.1* 10.8* 10.4*  HCT 29.7* 30.3* 31.9* 31.2*  MCV 93.1 94.1 94.1 94.5  PLT 223 240 290 347   Cardiac Enzymes: No results for input(s): CKTOTAL, CKMB, CKMBINDEX, TROPONINI in the last 168 hours. BNP (last 3 results) No results for input(s): BNP in the last 8760 hours.  ProBNP (last 3 results) No results for input(s): PROBNP in the last 8760 hours.  CBG:  Recent Labs Lab 04/15/15 0003  04/15/15 0738 04/15/15 1621 04/16/15 0135 04/16/15 0741  GLUCAP 122* 133* 129* 121* 124*    No results found for this or any previous visit (from the past 240 hour(s)).   Studies: No results found.  Scheduled Meds: . antiseptic oral rinse  7 mL Mouth Rinse QID  . chlorhexidine gluconate  15 mL Mouth Rinse BID  . famotidine (PEPCID) IV  20 mg Intravenous Q12H  . insulin aspart  0-9 Units Subcutaneous Q8H  . sodium chloride  10-40 mL Intracatheter Q12H  . sodium chloride  3 mL Intravenous Q12H   Continuous Infusions: . dextrose 5 % and 0.9 % NaCl with KCl 40 mEq/L 10 mL/hr at 04/12/15 0600  . diltiazem (CARDIZEM) infusion 5 mg/hr (04/16/15 1032)  . Marland KitchenTPN (CLINIMIX-E) Adult 70 mL/hr at 04/15/15 1857   And  . fat emulsion 180 mL (04/15/15 1857)  . Marland KitchenTPN (CLINIMIX-E) Adult     And  . fat emulsion      Principal Problem:   Incarcerated obturator hernia with SBO Active Problems:   Atrial fibrillation   Chronic asthma   Chronic respiratory failure   SBO (small bowel obstruction)   Incarcerated hernia   Acute respiratory failure   Aspiration pneumonia   Acute delirium   Altered mental status    Time spent: 25 min    [facility] S  [facility] Pager [phone]. If 7PM-7AM, please contact night-coverage at [email or web address], password [facility] 04/16/2015, 1:02 PM  LOS: 11 days

## 2015-04-16 NOTE — Progress Notes (Signed)
PARENTERAL NUTRITION CONSULT NOTE - Follow up  Pharmacy Consult for TPN Indication: Prolonged Ileus  Allergies  Allergen Reactions  . Codeine Nausea And Vomiting  . Morphine And Related Nausea And Vomiting  . Promethazine Hcl     hallucinations    Patient Measurements: Height: 5\' 5"  (165.1 cm) Weight: 112 lb 7 oz (51 kg) IBW/kg (Calculated) : 57  Vital Signs: Temp: 97.8 F (36.6 C) (09/12 2100) Temp Source: Axillary (09/12 2100) BP: 108/62 mmHg (09/13 0624) Pulse Rate: 83 (09/12 2100) Intake/Output from previous day: 09/12 0701 - 09/13 0700 In: 3363.9 [I.V.:315; IV Piggyback:100; TPN:2948.9] Out: 1975 [Urine:1975]  Labs:  Recent Labs  04/15/15 0600  WBC 12.0*  HGB 10.4*  HCT 31.2*  PLT 347     Recent Labs  04/15/15 0600  NA 133*  K 4.2  CL 99*  CO2 28  GLUCOSE 124*  BUN 23*  CREATININE <0.30*  CALCIUM 8.4*  MG 1.8  PHOS 3.1  PROT 5.9*  ALBUMIN 2.9*  AST 29  ALT 27  ALKPHOS Emily  BILITOT 0.6  PREALBUMIN 14.2*  TRIG 35   CrCl cannot be calculated (Patient has no serum creatinine result on file.).    Recent Labs  04/15/15 1621 04/16/15 0135 04/16/15 0741  GLUCAP 129* 121* 124*   Insulin Requirements in the past 24 hours: 3 units sensitive Novolog SSI  Current Nutrition: NPO  IVF: D5 NS KCl 40 meq/L @ KVO  Central access: PICC  TPN start date: 9/7  ASSESSMENT                                                                                                          HPI: Emily Wyatt presents to ED on 9/2 with intractable N/V. PMH includes afib, COPD, recurrent bowel obstruction and hernia surgery. CT (9/2) shows SBO from incarcerated hernia, as well as bilateral pneumonia or aspiration.  NG tube placed, surgery is consulted, s/p OR on 9/2 for hernia and SBO.  She has not had any stool or flatus as of 9/7, still has ileus, and is unable to tolerate enteral feedings.  Pharmacy is consulted to start TPN.  High risk of refeeding, pt has had poor PO  intake for several weeks, NPO since admission, and has electrolyte abnormalities.  Significant events:  9/2 s/p OR for exp lap, small bowel resection, hernia repair, peritoneal closure. 9/7 Remains unable to tolerate enteral feedings, TPN ordered. 9/9: NGT d/ced 9/13: remains nonverbal/confused postop.  No N/V but still failing swallow eval.  Palliative care consulted.  Today:   Glucose: at goal < 150's  Electrolytes: (9/12) Na slightly low at 133; others wnl  Renal: SCr remains low; good UOP  LFTs: albumin low, others wnl  TGs: wnl  Prealbumin:  Improved; 9.3 >> 14.2  Continue famotidine outside TPN  NUTRITIONAL GOALS  RD recs (9/6): 75-85 g protein/day, 1550-1750 kcal/day Clinimix 5/15 at a goal rate of 70 ml/hr + 20% fat emulsion at 10 ml/hr to provide: 84 g/day protein; 1672 Kcal/day.  PLAN                                                                                                                         At 1800 today:  Continue Clinimix E 5/15 at 70 ml/hr  Can increase 20% fat emulsion to standard 10 ml/hr (< 30% of calories now that TPN at goal rate)  TPN to contain standard multivitamins and trace elements.  Continue CBGs and sensitive SSI q8h  TPN lab panels on Mondays & Thursdays.  No levels require immediate f/u tomorrow.  F/u daily.  Bernadene Person, PharmD, BCPS Pager: [phone] 04/16/2015, 8:18 AM

## 2015-04-17 DIAGNOSIS — R52 Pain, unspecified: Secondary | ICD-10-CM

## 2015-04-17 DIAGNOSIS — K913 Postprocedural intestinal obstruction: Secondary | ICD-10-CM

## 2015-04-17 DIAGNOSIS — K9189 Other postprocedural complications and disorders of digestive system: Secondary | ICD-10-CM

## 2015-04-17 DIAGNOSIS — K567 Ileus, unspecified: Secondary | ICD-10-CM | POA: Insufficient documentation

## 2015-04-17 DIAGNOSIS — Z515 Encounter for palliative care: Secondary | ICD-10-CM

## 2015-04-17 DIAGNOSIS — Z66 Do not resuscitate: Secondary | ICD-10-CM

## 2015-04-17 DIAGNOSIS — J9611 Chronic respiratory failure with hypoxia: Secondary | ICD-10-CM

## 2015-04-17 LAB — GLUCOSE, CAPILLARY
GLUCOSE-CAPILLARY: 129 mg/dL — AB (ref 65–99)
Glucose-Capillary: 100 mg/dL — ABNORMAL HIGH (ref 65–99)
Glucose-Capillary: 126 mg/dL — ABNORMAL HIGH (ref 65–99)

## 2015-04-17 MED ORDER — MORPHINE SULFATE (CONCENTRATE) 10 MG /0.5 ML PO SOLN: 5.0000 mg | ORAL | Status: AC | PRN

## 2015-04-17 MED ORDER — FENTANYL CITRATE (PF) 100 MCG/2ML IJ SOLN
25.0000 ug | INTRAMUSCULAR | Status: DC | PRN
  Administered 2015-04-17 – 2015-04-18 (×6): 25 ug via INTRAVENOUS
  Filled 2015-04-17 (×7): qty 2

## 2015-04-17 MED ORDER — ACETAMINOPHEN 650 MG RE SUPP: 650.0000 mg | Freq: Four times a day (QID) | RECTAL | Status: AC | PRN

## 2015-04-17 MED ORDER — DILTIAZEM HCL 100 MG IV SOLR: 5.0000 mg/h | INTRAVENOUS | Status: AC

## 2015-04-17 MED ORDER — TRACE MINERALS CR-CU-MN-SE-ZN 10-1000-500-60 MCG/ML IV SOLN
INTRAVENOUS | Status: AC
  Filled 2015-04-17: qty 1680

## 2015-04-17 MED ORDER — CHLORHEXIDINE GLUCONATE 0.12% ORAL RINSE (MEDLINE KIT): 15.0000 mL | Freq: Two times a day (BID) | OROMUCOSAL | Status: AC

## 2015-04-17 MED ORDER — TRACE MINERALS CR-CU-MN-SE-ZN 10-1000-500-60 MCG/ML IV SOLN
INTRAVENOUS | Status: DC
  Filled 2015-04-17: qty 1680

## 2015-04-17 MED ORDER — FAT EMULSION 20 % IV EMUL
240.0000 mL | INTRAVENOUS | Status: AC
  Filled 2015-04-17: qty 250

## 2015-04-17 MED ORDER — FAT EMULSION 20 % IV EMUL
240.0000 mL | INTRAVENOUS | Status: DC
  Filled 2015-04-17: qty 250

## 2015-04-17 MED ORDER — LORAZEPAM 2 MG/ML PO CONC: 1.0000 mg | ORAL | Status: AC | PRN

## 2015-04-17 NOTE — Progress Notes (Signed)
Speech Language Pathology Discharge Patient Details Name: Emily Wyatt MRN: [id number] DOB: 07-03-1927 Today's Date: 04/17/2015 Time:  -   Pt for comfort only and to dc to [facility] today.  Patient discharged from SLP services secondary to medical decline - will need to re-order SLP to resume therapy services and patient has made no progress toward goals in a reasonable time frame.  Please see latest therapy progress note for current level of functioning and progress toward goals.    Progress and discharge plan discussed with patient and/or caregiver: Patient unable to participate in discharge planning and no caregivers available  GO    Donavan Burnet, MS [facility] SLP [phone]

## 2015-04-17 NOTE — Care Management Note (Signed)
Case Management Note  Patient Details  Name: Emily Wyatt MRN: [id number] Date of Birth: 1927-08-01  Subjective/Objective:                    Action/Plan:d/c [facility]   Expected Discharge Date:   (UNKNOWN)               Expected Discharge Plan:  Hospice Medical Facility  In-House Referral:  Clinical Social Work  Discharge planning Services  CM Consult  Post Acute Care Choice:  NA Choice offered to:  NA  DME Arranged:    DME Agency:     HH Arranged:    HH Agency:     Status of Service:  Completed, signed off  Medicare Important Message Given:  Yes-second notification given Date Medicare IM Given:    Medicare IM give by:    Date Additional Medicare IM Given:    Additional Medicare Important Message give by:     If discussed at Long Length of Stay Meetings, dates discussed:    Additional Comments:  Lanier Clam, RN 04/17/2015, 1:01 PM

## 2015-04-17 NOTE — Progress Notes (Signed)
CSW received word back from Carley Hammed stating that [facility] is full today. CSW spoke with patient's daughter, Bonita Quin who asked for [facility]. CSW made referral to Lafonda Mosses at [facility] - awaiting response back re: bed availability/eligibility.      Lincoln Maxin, LCSW [facility] Clinical Social Worker cell #: [phone]

## 2015-04-17 NOTE — Discharge Summary (Addendum)
Physician Discharge Summary  Emily Wyatt MRN: [id number] DOB/AGE: 1927-01-25 81 y.o.  PCP: Pearson Grippe, MD   Admit date: 04/05/2015 Discharge date: 04/17/2015  Discharge Diagnoses:     Principal Problem:   Incarcerated obturator hernia with SBO Active Problems:   Atrial fibrillation   Chronic asthma   Chronic respiratory failure   SBO (small bowel obstruction)   Incarcerated hernia   Acute respiratory failure   Aspiration pneumonia   Acute delirium   Altered mental status   Palliative care encounter   DNR (do not resuscitate)    Patient will be discharged  To [facility]       Medication List    STOP taking these medications        aspirin 81 MG tablet     BIOTIN PO     cholecalciferol 1000 UNITS tablet  Commonly known as:  VITAMIN D     cyanocobalamin 1000 MCG tablet     Ibuprofen 200 MG Caps     naproxen sodium 220 MG tablet  Commonly known as:  ANAPROX      TAKE these medications        acetaminophen 325 MG tablet  Commonly known as:  TYLENOL  Take 650 mg by mouth every 6 (six) hours as needed.     acetaminophen 650 MG suppository  Commonly known as:  TYLENOL  Place 1 suppository (650 mg total) rectally every 6 (six) hours as needed for fever, mild pain or moderate pain.     ADVAIR DISKUS 250-50 MCG/DOSE Aepb  Generic drug:  Fluticasone-Salmeterol  INHALE ONE PUFF BY MOUTH EVERY 12 HOURS     albuterol 108 (90 BASE) MCG/ACT inhaler  Commonly known as:  PROVENTIL HFA;VENTOLIN HFA  Inhale 2 puffs into the lungs every 4 (four) hours as needed for wheezing or shortness of breath.     chlorhexidine gluconate 0.12 % solution  Commonly known as:  PERIDEX  15 mLs by Mouth Rinse route 2 (two) times daily.     cyclobenzaprine 10 MG tablet  Commonly known as:  FLEXERIL  Take 5 mg by mouth 3 (three) times daily as needed for muscle spasms.     diltiazem 180 MG 24 hr capsule  Commonly known as:  TIAZAC  Take 1 capsule (180 mg total) by mouth  daily.     docusate sodium 100 MG capsule  Commonly known as:  COLACE  Take 100 mg by mouth daily as needed for mild constipation. Stool softenener     escitalopram 10 MG tablet  Commonly known as:  LEXAPRO  Take 15 mg by mouth daily.     LORazepam 2 MG/ML concentrated solution  Commonly known as:  ATIVAN  Take 0.5 mLs (1 mg total) by mouth every 4 (four) hours as needed for anxiety.     morphine CONCENTRATE 10 mg / 0.5 ml concentrated solution  Place 0.25 mLs (5 mg total) under the tongue every 2 (two) hours as needed for severe pain.     MUCINEX DM PO  Take by mouth 2 (two) times daily.     Potassium Gluconate 595 MG Caps  Take 1 capsule by mouth daily.         Discharge Condition:   Disposition: 01-Home or Self Care   Consults:  Palliative care General surgery     Significant Diagnostic Studies:  Ct Head Wo Contrast  04/14/2015   CLINICAL DATA:  Behavioral changes. Confusion post extubation. Recent abdominal surgery.  EXAM: CT HEAD  WITHOUT CONTRAST  TECHNIQUE: Contiguous axial images were obtained from the base of the skull through the vertex without intravenous contrast.  COMPARISON:  09/26/2004.  FINDINGS: No evidence for acute infarction, hemorrhage, mass lesion, hydrocephalus, or extra-axial fluid. Moderate atrophy. Small vessel disease. Calvarium intact. BILATERAL ocular surgery. Vascular calcification. No sinus or mastoid disease. Similar appearance to priors.  IMPRESSION: Stable chronic changes as described. No acute intracranial findings.   Electronically Signed   By: Elsie Stain M.D.   On: 04/14/2015 10:36   Ct Abdomen Pelvis W Contrast  04/05/2015   CLINICAL DATA:  Vomiting for 2 days.  EXAM: CT ABDOMEN AND PELVIS WITH CONTRAST  TECHNIQUE: Multidetector CT imaging of the abdomen and pelvis was performed using the standard protocol following bolus administration of intravenous contrast.  CONTRAST:  80mL OMNIPAQUE IOHEXOL 300 MG/ML  SOLN  COMPARISON:   Abdominal series 04/05/2015.  CT 07/18/2011  FINDINGS: There is patchy bilateral airspace disease in the lower lungs. Findings are suggestive for pneumonia or aspiration. No large pleural effusions. There is enlargement of the heart, particularly the right atrium. Nasogastric tube extends into the proximal stomach but kinks back and terminates near the GE junction. The examination has limitations due to motion artifact. There is no evidence for free intraperitoneal air.  There is chronic dilatation of the intrahepatic and extrahepatic biliary system and post cholecystectomy. Extrahepatic bile duct measures up to 2.2 cm and similar to the prior examination. Chronic dilatation of the main pancreatic duct measuring 0.5 cm. Portal venous system is patent.  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 No gross abnormality to the adrenal tissue. Probable cyst in left kidney upper pole. No acute abnormality involving the kidneys. Again noted are prominent exophytic cysts involving the right kidney.  Despite having having the nasogastric tube, there continues to be a fluid-filled stomach. Again noted are bowel structures anterior to the gastric antrum.  There are dilated loops of small bowel in the abdomen containing fluid and gas. Small bowel loops measure up to 3.8 cm. Evidence for fluid or edema in the left lower abdomen on sequence 2, image 52. Large amount of fluid in the pelvis probably represents a distended urinary bladder. There is a left obturator hernia containing a loop of bowel with surrounding fluid. This appears to be the cause for the small bowel obstruction. Findings are compatible with an incarcerated hernia. In addition, there may be a small left femoral hernia. Evidence of previous right inguinal hernia repair. Evidence for rectocele along the left side the perineum. Uterus is absent. There are decompressed loops of small bowel in the right abdomen.  Probable  large Tarlov cysts in the sacrum. Old compression fracture involving T12 vertebral body. Compression deformity involving the T11 vertebral body is new since 2012. Multilevel disc disease. Grade 1 anterolisthesis at L3-L4.  IMPRESSION: Small bowel obstruction secondary to an incarcerated left obturator hernia. Fluid surrounding the herniated bowel loop. Small amount of fluid and edema in the left lower abdomen. In addition, there is a small left femoral hernia.  Bilateral airspace disease in the lower lungs compatible with bilateral pneumonia and/or aspiration.  The nasogastric tube is kinked in the proximal stomach and the tip is pointing cephalad near the GE junction. Recommend repositioning of the nasogastric tube.  Chronic biliary dilatation.  Bilateral kidney cysts.  Compression fractures as described. T11 compression fracture is new since 2012.  These results were called by telephone at the time  of interpretation on 04/05/2015 at 4:12 pm to [facility] , who verbally acknowledged these results.   Electronically Signed   By: Richarda Overlie M.D.   On: 04/05/2015 16:14   Dg Chest Port 1 View  04/10/2015   CLINICAL DATA:  Confirm PICC line placement  EXAM: PORTABLE CHEST - 1 VIEW  COMPARISON:  04/08/2015  FINDINGS: Right PICC line identified with tip to cavoatrial junction. No pneumothorax.  Stable cardiac enlargement and aortic calcification. NG tube noted with side hole catheter above the gastroesophageal junction and tip just beyond the gastroesophageal junction. CT-diffuse interstitial prominence stable. Right lower lobe infiltrate and left lower lobe consolidation stable.  IMPRESSION: Right PICC line as described.  Orogastric tube has been pulled back with side hole above the gastroesophageal junction.   Electronically Signed   By: Esperanza Heir M.D.   On: 04/10/2015 20:34   Dg Chest Port 1 View  04/08/2015   CLINICAL DATA:  81 year old female with confusion shortness of breath and weakness. Recent  respiratory failure and intubation. Initial encounter.  EXAM: PORTABLE CHEST - 1 VIEW  COMPARISON:  04/07/2015 and earlier.  FINDINGS: Portable AP semi upright view at 0154 hours. Extubated. Enteric tube remains in place. Stable cardiomegaly and mediastinal contours. Calcified aortic atherosclerosis. Mildly lower lung volumes. Continued dense retrocardiac opacity. Bilateral veiling pulmonary opacity also appears stable. Stable pulmonary vascularity. No pneumothorax.  IMPRESSION: 1. Extubated.  Mildly lower lung volumes.  Stable enteric tube. 2. Otherwise stable ventilation with lower lobe collapse consolidation and possible small pleural effusions.   Electronically Signed   By: Odessa Fleming M.D.   On: 04/08/2015 07:51   Dg Chest Port 1 View  04/07/2015   CLINICAL DATA:  Acute respiratory failure.  Ex-smoker.  EXAM: PORTABLE CHEST - 1 VIEW  COMPARISON:  Yesterday.  FINDINGS: Stable enlarged cardiac silhouette. Endotracheal tube in satisfactory position. Nasogastric tube extending into the stomach. No significant change in right basilar airspace opacity. Mildly improved left basilar airspace opacity. Stable prominence of the interstitial markings, diffuse osteopenia and bilateral shoulder degenerative changes. Stable thoracolumbar vertebral compression deformity.  IMPRESSION: 1. Mildly increased right basilar probable pneumonia. 2. Mildly improved left basilar atelectasis and possible pneumonia. 3. Stable cardiomegaly and chronic interstitial lung disease.   Electronically Signed   By: Beckie Salts M.D.   On: 04/07/2015 09:24   Portable Chest 1 View  04/06/2015   CLINICAL DATA:  Endotracheal tube placement. Intractable nausea and vomiting.  EXAM: PORTABLE CHEST - 1 VIEW  COMPARISON:  Chest radiograph April 05, 2015  FINDINGS: Interval intubation, distal tip projects 4 cm above the carina. Nasogastric tube tip and side port projects in proximal stomach.  Stable cardiomegaly. Calcified aortic knob. Diffuse  interstitial prominence, patchy lower lobe airspace opacities, increased on the RIGHT. No pleural effusion. No pneumothorax. RIGHT apical pleural thickening. Patient is osteopenic.  IMPRESSION: ET tube tip projects 4 cm above the carina. Nasogastric tube tip projects in proximal stomach.  Interstitial prominence, increasing bibasilar consolidation concerning for bronchopneumonia.  Stable cardiomegaly.   Electronically Signed   By: Awilda Metro M.D.   On: 04/06/2015 03:48   Dg Abd Acute W/chest  04/05/2015   CLINICAL DATA:  Nausea and vomiting for 3-4 days. Recent fall. Abdominal discomfort and distention.  EXAM: DG ABDOMEN ACUTE W/ 1V CHEST  COMPARISON:  04/03/2015 and 02/21/2015  FINDINGS: New densities in the left lower chest are concerning for airspace disease and pneumonia. Heart size is upper limits normal and stable. Atherosclerotic  calcifications at the aortic arch. Scoliosis of the thoracolumbar spine. Dilated gas-filled loops of bowel in the lower abdomen. These appear to represent dilated small bowel loops, largest measuring up to 4.1 cm. No evidence for free air on the decubitus image. Surgical changes in the pelvis.  IMPRESSION: Dilated small bowel loops in the abdomen. Findings are concerning for a small bowel obstruction. No evidence for free air.  Concern for new airspace densities in the left lung which could represent an area of pneumonia or aspiration.   Electronically Signed   By: Richarda Overlie M.D.   On: 04/05/2015 12:31   Dg Abd Portable 1v  04/11/2015   CLINICAL DATA:  Postoperative ileus.  EXAM: PORTABLE ABDOMEN - 1 VIEW  COMPARISON:  April 07, 2015.  FINDINGS: The bowel gas pattern is normal. Distal tip of nasogastric tube is in expected position of stomach and has been advanced more distally since prior exam. Midline surgical staples are noted. Status post cholecystectomy. Phlebolith is seen in right side of pelvis.  IMPRESSION: No evidence of bowel obstruction or ileus. Nasogastric  tube in grossly good position.   Electronically Signed   By: Lupita Raider, M.D.   On: 04/11/2015 07:07   Dg Abd Portable 1v  04/08/2015   CLINICAL DATA:  NG tube placement  EXAM: PORTABLE ABDOMEN - 1 VIEW  COMPARISON:  04/05/2015  FINDINGS: An enteric tube is been placed. The tip is just below the left hemidiaphragm consistent with location in the upper stomach. Proximal side hole is projected over the lower chest, likely in the distal esophagus. Consider advancing the tube 4-5 cm for better placement within the stomach.  Interval postoperative changes with skin clips across the midline. Residual gas and stool in the colon and small bowel with decreased bowel distention since previous study. Probable bronchiectasis and scarring in the lung bases. Degenerative changes in the spine.  IMPRESSION: Enteric tube tip appears to be just below the EG junction with proximal side hole likely above the EG junction in the lower esophagus. Consider advancing the tube for 4-5 cm for better placement.   Electronically Signed   By: Burman Nieves M.D.   On: 04/08/2015 02:10   Dg Abd Portable 2v  04/14/2015   CLINICAL DATA:  81 year old female with a history of bowel obstruction secondary to obturator hernia.  EXAM: PORTABLE ABDOMEN - 2 VIEW  COMPARISON:  CT 04/05/2015, plain film 04/07/2015, 04/11/2015  FINDINGS: Gas within small bowel and colon. Formed stool within the rectum and sigmoid. No abnormally distended small bowel or colon.  Compared to plain film 04/03/2015 and the CT 04/05/2015 there has been decreased diameter of loops within the pelvis.  Decubitus image demonstrates no evidence of free air.  No air-fluid levels.  Vascular calcifications throughout the abdomen pelvis.  Surgical staples along the midline pelvis. Surgical suture line within the low pelvis.  Interval removal of gastric tube.  IMPRESSION: Postoperative changes in this patient status post exploratory laparotomy, small-bowel resection, and  incarcerated hernia repair, with no evidence of obstruction.  Vascular calcifications.  Signed,  Yvone Neu. Loreta Ave, DO  [facility]  [facility]   Electronically Signed   By: Gilmer Mor D.O.   On: 04/14/2015 11:22        Filed Weights   04/05/15 1103 04/05/15 2300 04/07/15 0400  Weight: 44.906 kg (99 lb) 50.1 kg (110 lb 7.2 oz) 51 kg (112 lb 7 oz)     Microbiology: No results  found for this or any previous visit (from the past 240 hour(s)).     Blood Culture    Component Value Date/Time   SDES BLOOD LEFT ARM 07/30/2013 1155   SPECREQUEST BOTTLES DRAWN AEROBIC AND ANAEROBIC 4CC 07/30/2013 1155   CULT  07/30/2013 1155    NO GROWTH 5 DAYS Performed at [facility]   REPTSTATUS 08/05/2013 FINAL 07/30/2013 1155      Labs: Results for orders placed or performed during the hospital encounter of 04/05/15 (from the past 48 hour(s))  Glucose, capillary     Status: Abnormal   Collection Time: 04/15/15  4:21 PM  Result Value Ref Range   Glucose-Capillary 129 (H) 65 - 99 mg/dL  Glucose, capillary     Status: Abnormal   Collection Time: 04/16/15  1:35 AM  Result Value Ref Range   Glucose-Capillary 121 (H) 65 - 99 mg/dL  Glucose, capillary     Status: Abnormal   Collection Time: 04/16/15  7:41 AM  Result Value Ref Range   Glucose-Capillary 124 (H) 65 - 99 mg/dL  Glucose, capillary     Status: Abnormal   Collection Time: 04/16/15  4:34 PM  Result Value Ref Range   Glucose-Capillary 127 (H) 65 - 99 mg/dL  Glucose, capillary     Status: Abnormal   Collection Time: 04/17/15 12:04 AM  Result Value Ref Range   Glucose-Capillary 126 (H) 65 - 99 mg/dL  Glucose, capillary     Status: Abnormal   Collection Time: 04/17/15  8:03 AM  Result Value Ref Range   Glucose-Capillary 129 (H) 65 - 99 mg/dL     Lipid Panel     Component Value Date/Time   TRIG 35 04/15/2015 0600     Lab Results  Component Value Date   HGBA1C *  11/11/2009    6.4 (NOTE) The ADA recommends the following therapeutic goal for glycemic control related to Hgb A1c measurement: Goal of therapy: <6.5 Hgb A1c  Reference: American Diabetes Association: Clinical Practice Recommendations 2010, Diabetes Care, 2010, 33: (Suppl  1).     Lab Results  Component Value Date   CREATININE <0.30* 04/15/2015     HPI : 81 year old elderly woman. Lives by herself. Family involved. Relatively independent. Apparently fell down a few days ago. Saw her primary care physician. Workup negative for fracture or head bleed. Then began to have crampy pain and left thigh pain. Abdominal pain worsened. Nausea and vomiting. Had a horrible night. Can emergency room today. Has not been able take her heart medicines. Heart rate getting higher. [facility] emergency room. Concern for bowel obstruction. CT scan concern for incarcerated obturator hernia. Surgical consultation requested. I saw the patient once I came out of emergency surgery.    Patient confused with nasogastric tube in place. Feculent drainage at the time of admission. Moderately distended. Some discomfort but would not say frank peritonitis.   Considered moderate high risk. Family feels patient had good independent quality of life and  Wished  to be aggressive with her care . She has had prior abdominal surgeries. Had rectal prolapse. Underwent laparoscopically assisted sigmoid colectomy and rectopexy in 2010. Laparoscopic cholecystectomy in 2002.   HOSPITAL COURSE:  Post operative delirium- patient continues to be confused and nonverbal postop. CT head was negative for stroke. Patient is not agitated this time. Acute respiratory failure- resolved, patient was started on Zosyn and then later switched to ceftriaxone completed 7 days of therapy for possible aspiration pneumonitis. Hypokalemia- potassium replaced Atrial fibrillation with  RVR - patient currently on Cardizem drip. Heart rate  controlled. Malnutrition- started TNA via picc line., now weaned off  Anemia- post op, hemoglobin is stable at 10.1  Goals of care DNR/DNI Patient will be discharged to hospice home when bed available  Roxanol and Ativan for shortness of breath and comfort   Discharge Exam:    Blood pressure 114/44, pulse 87, temperature 99.1 F (37.3 C), temperature source Axillary, resp. rate 20, height 5\' 5"  (1.651 m), weight 51 kg (112 lb 7 oz), SpO2 100 %.   General: Appears in no acute distress, confused  Cardiovascular: S1-S2 is regular  Respiratory: Clear to auscultation bilaterally  Abdomen: Soft, nontender, no organomegaly  Musculoskeletal: No cyanosis/clubbing/edema of the lower extremities         Discharge Instructions    Diet - low sodium heart healthy    Complete by:  As directed      Increase activity slowly    Complete by:  As directed            Follow-up Information    Follow up with Pearson Grippe, MD. Schedule an appointment as soon as possible for a visit in 3 days.   Specialty:  Internal Medicine   Contact information:   [address] [place] [address] [phone]       Signed: Richarda Overlie 04/17/2015, 10:42 AM        Time spent >45 mins

## 2015-04-17 NOTE — Progress Notes (Signed)
12 Days Post-Op  Subjective: Pt care being shifted to comfort care and [facility].  Objective: Vital signs in last 24 hours: Temp:  [97.9 F (36.6 C)-99.1 F (37.3 C)] 99.1 F (37.3 C) (09/14 0429) Pulse Rate:  [87-104] 87 (09/14 0429) Resp:  [20-22] 20 (09/14 0429) BP: (95-114)/(44-53) 114/44 mmHg (09/14 0429) SpO2:  [97 %-100 %] 100 % (09/14 0429) Last BM Date: 04/16/15 Tm 99.1, VSS No labs  Intake/Output from previous day: 09/13 0701 - 09/14 0700 In: 1491.2 [I.V.:270; IV Piggyback:100; TPN:1121.2] Out: 1110 [Urine:1110] Intake/Output this shift:    General appearance: she has her eyes open,but is not responsive, even to friends and family in the room. GI: soft, some distension, + BS, her incision looks fine.  i would leave her staples for now.    Lab Results:   Recent Labs  04/15/15 0600  WBC 12.0*  HGB 10.4*  HCT 31.2*  PLT 347    BMET  Recent Labs  04/15/15 0600  NA 133*  K 4.2  CL 99*  CO2 28  GLUCOSE 124*  BUN 23*  CREATININE <0.30*  CALCIUM 8.4*   PT/INR No results for input(s): LABPROT, INR in the last 72 hours.   Recent Labs Lab 04/11/15 0543 04/15/15 0600  AST 29 29  ALT 33 27  ALKPHOS 69 80  BILITOT 0.8 0.6  PROT 5.5* 5.9*  ALBUMIN 2.9* 2.9*     Lipase     Component Value Date/Time   LIPASE 22 11/10/2009 2225     Studies/Results: No results found.  Medications: . antiseptic oral rinse  7 mL Mouth Rinse QID  . chlorhexidine gluconate  15 mL Mouth Rinse BID  . famotidine (PEPCID) IV  20 mg Intravenous Q12H  . insulin aspart  0-9 Units Subcutaneous Q8H  . sodium chloride  10-40 mL Intracatheter Q12H  . sodium chloride  3 mL Intravenous Q12H    Assessment/Plan Obturator hernia with obstruction, SBO (small bowel obstruction),Small bowel necrosis S/p EXPLORATORY LAPAROTOMY, SMALL BOWEL RESECTION, REPAIR OF INCARCERATED HERNIA, 04/05/15, Dr. Glenna Fellows T POD 11 Acute respiratory issues resolved  Post op  delirium/unresponsive Swallowing concerns secondary to mental status AF not on anticoagulation; currently on Cardizem drip Anemia Hx of GI bleed 2 years ago Hx of rectal prolapase IBS, Hx of diverticulosis Hx of anxiety and depression Antibiotics: Day 4 Zosyn DVT: I have added SCD's   Plan:  She is going to Hospice.  We will see again as needed.  i would leave her staple for now.    LOS: 12 days    Emily Wyatt 04/17/2015

## 2015-04-17 NOTE — Progress Notes (Signed)
CSW received consult from PMT NP, Lorinda Creed that patient's family is requesting [facility]. CSW confirmed with patient's daughter, Lilyan Punt (cell#: [phone]) that they want [facility] & made referral to Forrestine Him, [facility] Liaison - awaiting response re: bed availability/eligibility.      Lincoln Maxin, LCSW [facility] Clinical Social Worker cell #: [phone]

## 2015-04-17 NOTE — Progress Notes (Addendum)
PARENTERAL NUTRITION CONSULT NOTE - Follow up  Pharmacy Consult for TPN Indication: Prolonged Ileus  Allergies  Allergen Reactions  . Codeine Nausea And Vomiting  . Morphine And Related Nausea And Vomiting  . Promethazine Hcl     hallucinations    Patient Measurements: Height: 5\' 5"  (165.1 cm) Weight: 112 lb 7 oz (51 kg) IBW/kg (Calculated) : 57  Vital Signs: Temp: 99.1 F (37.3 C) (09/14 0429) Temp Source: Axillary (09/14 0429) BP: 114/44 mmHg (09/14 0429) Pulse Rate: 87 (09/14 0429) Intake/Output from previous day: 09/13 0701 - 09/14 0700 In: 1491.2 [I.V.:270; IV Piggyback:100; TPN:1121.2] Out: 1110 [Urine:1110]  Labs:  Recent Labs  04/15/15 0600  WBC 12.0*  HGB 10.4*  HCT 31.2*  PLT 347     Recent Labs  04/15/15 0600  NA 133*  K 4.2  CL 99*  CO2 28  GLUCOSE 124*  BUN 23*  CREATININE <0.30*  CALCIUM 8.4*  MG 1.8  PHOS 3.1  PROT 5.9*  ALBUMIN 2.9*  AST 29  ALT 27  ALKPHOS 80  BILITOT 0.6  PREALBUMIN 14.2*  TRIG 35   CrCl cannot be calculated (Patient has no serum creatinine result on file.).    Recent Labs  04/16/15 1634 04/17/15 0004 04/17/15 0803  GLUCAP 127* 126* 129*   Insulin Requirements in the past 24 hours: 3 units sensitive Novolog SSI  Current Nutrition: NPO  IVF: D5 NS KCl 40 meq/L @ KVO  Central access: PICC  TPN start date: 9/7  ASSESSMENT                                                                                                          HPI: 89 yoF presents to ED on 9/2 with intractable N/V. PMH includes afib, COPD, recurrent bowel obstruction and hernia surgery. CT (9/2) shows SBO from incarcerated hernia, as well as bilateral pneumonia or aspiration.  NG tube placed, surgery is consulted, s/p OR on 9/2 for hernia and SBO.  She has not had any stool or flatus as of 9/7, still has ileus, and is unable to tolerate enteral feedings.  Pharmacy is consulted to start TPN.  High risk of refeeding, pt has had poor PO  intake for several weeks, NPO since admission, and has electrolyte abnormalities.  Significant events:  9/2 s/p OR for exp lap, small bowel resection, hernia repair, peritoneal closure. 9/7 Remains unable to tolerate enteral feedings, TPN ordered. 9/9: NGT d/ced 9/13: remains nonverbal/confused postop.  No N/V but still failing swallow eval.  Palliative care consulted.  Today: (last BMP 9/12)  Glucose: at goal < 150's  Electrolytes: Na slightly low at 133; others wnl  Renal: SCr remains low; good UOP.  Note patient is now +7 L since admit  LFTs: albumin low, others wnl  TGs: wnl  Prealbumin:  Improved; 9.3 >> 14.2  Continue famotidine outside TPN  NUTRITIONAL GOALS  RD recs (9/6): 75-85 g protein/day, 1550-1750 kcal/day Clinimix 5/15 at a goal rate of 70 ml/hr + 20% fat emulsion at 10 ml/hr to provide: 84 g/day protein; 1672 Kcal/day.  PLAN                                                                                                                         At 1800 today:  Continue Clinimix E 5/15 at 70 ml/hr  Continue 20% fat emulsion at 10 ml/hr  TPN to contain standard multivitamins and trace elements.  Continue CBGs and sensitive SSI q8h  TPN lab panels on Mondays & Thursdays.  No levels require immediate f/u tomorrow.  F/u daily.  Patient recovering appropriately from surgery but continues to be NPO due to severe cognitive deficits which have not improved.  It seems at this point the nutrition plan will be to continue full parenteral nutrition as is until a palliative care decision is made.  Bernadene Person, PharmD, BCPS Pager: [phone] 04/17/2015, 8:15 AM  ADDENDUM At 0900 today, spoke with Palliative Care MD who is transferring patient to full comfort care.  Was asked to taper TPN to off this AM  Plan: Cancel orders for TPN today at 1800 Will reduce TPN to 35 ml/hr  (50%) today at 1000, then off at [address] PharmD, BCPS Pager: [phone] 04/17/2015, 9:40 AM

## 2015-04-17 NOTE — Progress Notes (Signed)
Daily Progress Note   Patient Name: Emily Wyatt       Date: 04/17/2015 DOB: 09/13/1926  Age: 81 y.o. MRN#: [id number] Attending Physician: Richarda Overlie, MD Primary Care Physician: Pearson Grippe, MD Admit Date: 04/05/2015  Reason for Consultation/Follow-up: Establishing goals of care, Inpatient hospice referral, Non pain symptom management, Pain control and Psychosocial/spiritual support  Subjective:     -continued discussion with daughter Bonita Quin regarding diagnosis, prognosis, patient centered plan of care and options  -continued discussion regarding natural trajectory and expectations at EOL  -decision is for full shift to comfort, hopeful for hospice facility, will write for choice   Length of Stay: 12 days  Current Medications: Scheduled Meds:  . antiseptic oral rinse  7 mL Mouth Rinse QID  . chlorhexidine gluconate  15 mL Mouth Rinse BID  . famotidine (PEPCID) IV  20 mg Intravenous Q12H  . insulin aspart  0-9 Units Subcutaneous Q8H  . sodium chloride  10-40 mL Intracatheter Q12H  . sodium chloride  3 mL Intravenous Q12H    Continuous Infusions: . dextrose 5 % and 0.9 % NaCl with KCl 40 mEq/L 10 mL/hr at 04/12/15 0600  . diltiazem (CARDIZEM) infusion 5 mg/hr (04/17/15 0322)  . Marland KitchenTPN (CLINIMIX-E) Adult 70 mL/hr at 04/16/15 1744   And  . fat emulsion 240 mL (04/16/15 1744)  . Marland KitchenTPN (CLINIMIX-E) Adult     And  . fat emulsion      PRN Meds: acetaminophen, fentaNYL (SUBLIMAZE) injection, labetalol, levalbuterol, lip balm, ondansetron **OR** ondansetron (ZOFRAN) IV, sodium chloride  Palliative Performance Scale: 20%     Vital Signs: BP 114/44 mmHg  Pulse 87  Temp(Src) 99.1 F (37.3 C) (Axillary)  Resp 20  Ht 5\' 5"  (1.651 m)  Wt 51 kg (112 lb 7 oz)  BMI 18.71 kg/m2  SpO2 100% SpO2: SpO2: 100 % O2 Device: O2 Device: Nasal Cannula O2 Flow Rate: O2 Flow Rate (L/min): 2 L/min  Intake/output summary:  Intake/Output Summary (Last 24 hours) at 04/17/15 0900 Last data  filed at 04/17/15 0700  Gross per 24 hour  Intake 1491.17 ml  Output    960 ml  Net 531.17 ml   LBM: Last BM Date: 04/16/15 Baseline Weight: Weight: 44.906 kg (99 lb) Most recent weight: Weight: 51 kg (112 lb 7 oz)  Physical Exam:  General: minimally responsive, cachectic  HEENT: dry buccal membranes, no exudate CVS: RRR Resp: sonorous, decreased in bases Abd: soft, decreased bs Extrem: without edema Skin: warm and dry  Additional Data Reviewed: Recent Labs     04/15/15  0600  WBC  12.0*  HGB  10.4*  PLT  347  NA  133*  BUN  23*  CREATININE  <0.30*     Problem List:  Patient Active Problem List   Diagnosis Date Noted  . Palliative care encounter 04/17/2015  . DNR (do not resuscitate) 04/17/2015  . Acute delirium 04/15/2015  . Altered mental status   . Acute respiratory failure   . Aspiration pneumonia   . Incarcerated obturator hernia with SBO 04/05/2015  . SBO (small bowel obstruction) 04/05/2015  . Incarcerated hernia 04/05/2015  . Chronic respiratory failure 08/11/2013  . Chronic asthma 08/09/2013  . Diverticulosis of colon   . Rectal prolapse   . Atrial fibrillation      Palliative Care Assessment & Plan    Code Status:  DNR  Goals of Care:  Focus of care is full comfort, no further diagnostics, artificial feeding or hydration.  Hopeful  for hospice facility  Desire for further Chaplaincy support:no-strong communicty church support   Symptom Management:  Pain/Dyspnea: Fentanyl 25 mcg IV every 1 hr prn   5. Prognosis: < 2 weeks  5. Discharge Planning: Hospice facility, will write for choice   Care plan was discussed with Abrol  Thank you for allowing the Palliative Medicine Team to assist in the care of this patient.   Time In: 0950 Time Out: 1025 Total Time 35 min Prolonged Time Billed  no     Greater than 50%  of this time was spent counseling and coordinating care related to the above assessment and plan.     Canary Brim, NP  04/17/2015, 9:00 AM  Please contact Palliative Medicine Team phone at [phone] for questions and concerns.

## 2015-04-18 LAB — GLUCOSE, CAPILLARY: GLUCOSE-CAPILLARY: 89 mg/dL (ref 65–99)

## 2015-04-18 MED ORDER — HEPARIN SOD (PORK) LOCK FLUSH 100 UNIT/ML IV SOLN
250.0000 [IU] | INTRAVENOUS | Status: AC | PRN
  Administered 2015-04-18: 250 [IU]

## 2015-04-18 NOTE — Discharge Summary (Signed)
Physician Discharge Summary  Emily Wyatt MRN: [id number] DOB/AGE: 79/29/1928 79 y.o.  PCP: Pearson Grippe, MD   Admit date: 04/05/2015 Discharge date: 04/18/2015  Discharge Diagnoses:     Principal Problem:   Incarcerated obturator hernia with SBO Active Problems:   Atrial fibrillation   Chronic asthma   Chronic respiratory failure   SBO (small bowel obstruction)   Incarcerated hernia   Acute respiratory failure   Aspiration pneumonia   Acute delirium   Altered mental status   Palliative care encounter   DNR (do not resuscitate)   Pain, generalized   Ileus following gastrointestinal surgery    Patient will be discharged  To [facility] when bed available       Medication List    STOP taking these medications        aspirin 81 MG tablet     BIOTIN PO     cholecalciferol 1000 UNITS tablet  Commonly known as:  VITAMIN D     cyanocobalamin 1000 MCG tablet     Ibuprofen 200 MG Caps     naproxen sodium 220 MG tablet  Commonly known as:  ANAPROX      TAKE these medications        acetaminophen 325 MG tablet  Commonly known as:  TYLENOL  Take 650 mg by mouth every 6 (six) hours as needed.     acetaminophen 650 MG suppository  Commonly known as:  TYLENOL  Place 1 suppository (650 mg total) rectally every 6 (six) hours as needed for fever, mild pain or moderate pain.     ADVAIR DISKUS 250-50 MCG/DOSE Aepb  Generic drug:  Fluticasone-Salmeterol  INHALE ONE PUFF BY MOUTH EVERY 12 HOURS     albuterol 108 (90 BASE) MCG/ACT inhaler  Commonly known as:  PROVENTIL HFA;VENTOLIN HFA  Inhale 2 puffs into the lungs every 4 (four) hours as needed for wheezing or shortness of breath.     chlorhexidine gluconate 0.12 % solution  Commonly known as:  PERIDEX  15 mLs by Mouth Rinse route 2 (two) times daily.     cyclobenzaprine 10 MG tablet  Commonly known as:  FLEXERIL  Take 5 mg by mouth 3 (three) times daily as needed for muscle spasms.     diltiazem 180 MG 24 hr  capsule  Commonly known as:  TIAZAC  Take 1 capsule (180 mg total) by mouth daily.     docusate sodium 100 MG capsule  Commonly known as:  COLACE  Take 100 mg by mouth daily as needed for mild constipation. Stool softenener     escitalopram 10 MG tablet  Commonly known as:  LEXAPRO  Take 15 mg by mouth daily.     LORazepam 2 MG/ML concentrated solution  Commonly known as:  ATIVAN  Take 0.5 mLs (1 mg total) by mouth every 4 (four) hours as needed for anxiety.     morphine CONCENTRATE 10 mg / 0.5 ml concentrated solution  Place 0.25 mLs (5 mg total) under the tongue every 2 (two) hours as needed for severe pain.     MUCINEX DM PO  Take by mouth 2 (two) times daily.     Potassium Gluconate 595 MG Caps  Take 1 capsule by mouth daily.         Discharge Condition:   Disposition: 01-Home or Self Care   Consults:  Palliative care General surgery     Significant Diagnostic Studies:  Ct Head Wo Contrast  04/14/2015   CLINICAL DATA:  Behavioral changes. Confusion post extubation. Recent abdominal surgery.  EXAM: CT HEAD WITHOUT CONTRAST  TECHNIQUE: Contiguous axial images were obtained from the base of the skull through the vertex without intravenous contrast.  COMPARISON:  09/26/2004.  FINDINGS: No evidence for acute infarction, hemorrhage, mass lesion, hydrocephalus, or extra-axial fluid. Moderate atrophy. Small vessel disease. Calvarium intact. BILATERAL ocular surgery. Vascular calcification. No sinus or mastoid disease. Similar appearance to priors.  IMPRESSION: Stable chronic changes as described. No acute intracranial findings.   Electronically Signed   By: Elsie Stain M.D.   On: 04/14/2015 10:36   Ct Abdomen Pelvis W Contrast  04/05/2015   CLINICAL DATA:  Vomiting for 2 days.  EXAM: CT ABDOMEN AND PELVIS WITH CONTRAST  TECHNIQUE: Multidetector CT imaging of the abdomen and pelvis was performed using the standard protocol following bolus administration of intravenous  contrast.  CONTRAST:  80mL OMNIPAQUE IOHEXOL 300 MG/ML  SOLN  COMPARISON:  Abdominal series 04/05/2015.  CT 07/18/2011  FINDINGS: There is patchy bilateral airspace disease in the lower lungs. Findings are suggestive for pneumonia or aspiration. No large pleural effusions. There is enlargement of the heart, particularly the right atrium. Nasogastric tube extends into the proximal stomach but kinks back and terminates near the GE junction. The examination has limitations due to motion artifact. There is no evidence for free intraperitoneal air.  There is chronic dilatation of the intrahepatic and extrahepatic biliary system and post cholecystectomy. Extrahepatic bile duct measures up to 2.2 cm and similar to the prior examination. Chronic dilatation of the main pancreatic duct measuring 0.5 cm. Portal venous system is patent.  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 No gross abnormality to the adrenal tissue. Probable cyst in left kidney upper pole. No acute abnormality involving the kidneys. Again noted are prominent exophytic cysts involving the right kidney.  Despite having having the nasogastric tube, there continues to be a fluid-filled stomach. Again noted are bowel structures anterior to the gastric antrum.  There are dilated loops of small bowel in the abdomen containing fluid and gas. Small bowel loops measure up to 3.8 cm. Evidence for fluid or edema in the left lower abdomen on sequence 2, image 52. Large amount of fluid in the pelvis probably represents a distended urinary bladder. There is a left obturator hernia containing a loop of bowel with surrounding fluid. This appears to be the cause for the small bowel obstruction. Findings are compatible with an incarcerated hernia. In addition, there may be a small left femoral hernia. Evidence of previous right inguinal hernia repair. Evidence for rectocele along the left side the perineum. Uterus is absent. There  are decompressed loops of small bowel in the right abdomen.  Probable large Tarlov cysts in the sacrum. Old compression fracture involving T12 vertebral body. Compression deformity involving the T11 vertebral body is new since 2012. Multilevel disc disease. Grade 1 anterolisthesis at L3-L4.  IMPRESSION: Small bowel obstruction secondary to an incarcerated left obturator hernia. Fluid surrounding the herniated bowel loop. Small amount of fluid and edema in the left lower abdomen. In addition, there is a small left femoral hernia.  Bilateral airspace disease in the lower lungs compatible with bilateral pneumonia and/or aspiration.  The nasogastric tube is kinked in the proximal stomach and the tip is pointing cephalad near the GE junction. Recommend repositioning of the nasogastric tube.  Chronic biliary dilatation.  Bilateral kidney cysts.  Compression fractures as described. T11 compression fracture is new  since 2012.  These results were called by telephone at the time of interpretation on 04/05/2015 at 4:12 pm to [facility] , who verbally acknowledged these results.   Electronically Signed   By: Richarda Overlie M.D.   On: 04/05/2015 16:14   Dg Chest Port 1 View  04/10/2015   CLINICAL DATA:  Confirm PICC line placement  EXAM: PORTABLE CHEST - 1 VIEW  COMPARISON:  04/08/2015  FINDINGS: Right PICC line identified with tip to cavoatrial junction. No pneumothorax.  Stable cardiac enlargement and aortic calcification. NG tube noted with side hole catheter above the gastroesophageal junction and tip just beyond the gastroesophageal junction. CT-diffuse interstitial prominence stable. Right lower lobe infiltrate and left lower lobe consolidation stable.  IMPRESSION: Right PICC line as described.  Orogastric tube has been pulled back with side hole above the gastroesophageal junction.   Electronically Signed   By: Esperanza Heir M.D.   On: 04/10/2015 20:34   Dg Chest Port 1 View  04/08/2015   CLINICAL DATA:   81 year old female with confusion shortness of breath and weakness. Recent respiratory failure and intubation. Initial encounter.  EXAM: PORTABLE CHEST - 1 VIEW  COMPARISON:  04/07/2015 and earlier.  FINDINGS: Portable AP semi upright view at 0154 hours. Extubated. Enteric tube remains in place. Stable cardiomegaly and mediastinal contours. Calcified aortic atherosclerosis. Mildly lower lung volumes. Continued dense retrocardiac opacity. Bilateral veiling pulmonary opacity also appears stable. Stable pulmonary vascularity. No pneumothorax.  IMPRESSION: 1. Extubated.  Mildly lower lung volumes.  Stable enteric tube. 2. Otherwise stable ventilation with lower lobe collapse consolidation and possible small pleural effusions.   Electronically Signed   By: Odessa Fleming M.D.   On: 04/08/2015 07:51   Dg Chest Port 1 View  04/07/2015   CLINICAL DATA:  Acute respiratory failure.  Ex-smoker.  EXAM: PORTABLE CHEST - 1 VIEW  COMPARISON:  Yesterday.  FINDINGS: Stable enlarged cardiac silhouette. Endotracheal tube in satisfactory position. Nasogastric tube extending into the stomach. No significant change in right basilar airspace opacity. Mildly improved left basilar airspace opacity. Stable prominence of the interstitial markings, diffuse osteopenia and bilateral shoulder degenerative changes. Stable thoracolumbar vertebral compression deformity.  IMPRESSION: 1. Mildly increased right basilar probable pneumonia. 2. Mildly improved left basilar atelectasis and possible pneumonia. 3. Stable cardiomegaly and chronic interstitial lung disease.   Electronically Signed   By: Beckie Salts M.D.   On: 04/07/2015 09:24   Portable Chest 1 View  04/06/2015   CLINICAL DATA:  Endotracheal tube placement. Intractable nausea and vomiting.  EXAM: PORTABLE CHEST - 1 VIEW  COMPARISON:  Chest radiograph April 05, 2015  FINDINGS: Interval intubation, distal tip projects 4 cm above the carina. Nasogastric tube tip and side port projects in  proximal stomach.  Stable cardiomegaly. Calcified aortic knob. Diffuse interstitial prominence, patchy lower lobe airspace opacities, increased on the RIGHT. No pleural effusion. No pneumothorax. RIGHT apical pleural thickening. Patient is osteopenic.  IMPRESSION: ET tube tip projects 4 cm above the carina. Nasogastric tube tip projects in proximal stomach.  Interstitial prominence, increasing bibasilar consolidation concerning for bronchopneumonia.  Stable cardiomegaly.   Electronically Signed   By: Awilda Metro M.D.   On: 04/06/2015 03:48   Dg Abd Acute W/chest  04/05/2015   CLINICAL DATA:  Nausea and vomiting for 3-4 days. Recent fall. Abdominal discomfort and distention.  EXAM: DG ABDOMEN ACUTE W/ 1V CHEST  COMPARISON:  04/03/2015 and 02/21/2015  FINDINGS: New densities in the left lower chest are concerning for airspace  disease and pneumonia. Heart size is upper limits normal and stable. Atherosclerotic calcifications at the aortic arch. Scoliosis of the thoracolumbar spine. Dilated gas-filled loops of bowel in the lower abdomen. These appear to represent dilated small bowel loops, largest measuring up to 4.1 cm. No evidence for free air on the decubitus image. Surgical changes in the pelvis.  IMPRESSION: Dilated small bowel loops in the abdomen. Findings are concerning for a small bowel obstruction. No evidence for free air.  Concern for new airspace densities in the left lung which could represent an area of pneumonia or aspiration.   Electronically Signed   By: Richarda Overlie M.D.   On: 04/05/2015 12:31   Dg Abd Portable 1v  04/11/2015   CLINICAL DATA:  Postoperative ileus.  EXAM: PORTABLE ABDOMEN - 1 VIEW  COMPARISON:  April 07, 2015.  FINDINGS: The bowel gas pattern is normal. Distal tip of nasogastric tube is in expected position of stomach and has been advanced more distally since prior exam. Midline surgical staples are noted. Status post cholecystectomy. Phlebolith is seen in right side of  pelvis.  IMPRESSION: No evidence of bowel obstruction or ileus. Nasogastric tube in grossly good position.   Electronically Signed   By: Lupita Raider, M.D.   On: 04/11/2015 07:07   Dg Abd Portable 1v  04/08/2015   CLINICAL DATA:  NG tube placement  EXAM: PORTABLE ABDOMEN - 1 VIEW  COMPARISON:  04/05/2015  FINDINGS: An enteric tube is been placed. The tip is just below the left hemidiaphragm consistent with location in the upper stomach. Proximal side hole is projected over the lower chest, likely in the distal esophagus. Consider advancing the tube 4-5 cm for better placement within the stomach.  Interval postoperative changes with skin clips across the midline. Residual gas and stool in the colon and small bowel with decreased bowel distention since previous study. Probable bronchiectasis and scarring in the lung bases. Degenerative changes in the spine.  IMPRESSION: Enteric tube tip appears to be just below the EG junction with proximal side hole likely above the EG junction in the lower esophagus. Consider advancing the tube for 4-5 cm for better placement.   Electronically Signed   By: Burman Nieves M.D.   On: 04/08/2015 02:10   Dg Abd Portable 2v  04/14/2015   CLINICAL DATA:  81 year old female with a history of bowel obstruction secondary to obturator hernia.  EXAM: PORTABLE ABDOMEN - 2 VIEW  COMPARISON:  CT 04/05/2015, plain film 04/07/2015, 04/11/2015  FINDINGS: Gas within small bowel and colon. Formed stool within the rectum and sigmoid. No abnormally distended small bowel or colon.  Compared to plain film 04/03/2015 and the CT 04/05/2015 there has been decreased diameter of loops within the pelvis.  Decubitus image demonstrates no evidence of free air.  No air-fluid levels.  Vascular calcifications throughout the abdomen pelvis.  Surgical staples along the midline pelvis. Surgical suture line within the low pelvis.  Interval removal of gastric tube.  IMPRESSION: Postoperative changes in this  patient status post exploratory laparotomy, small-bowel resection, and incarcerated hernia repair, with no evidence of obstruction.  Vascular calcifications.  Signed,  Yvone Neu. Loreta Ave, DO  [facility]  [facility]   Electronically Signed   By: Gilmer Mor D.O.   On: 04/14/2015 11:22        Filed Weights   04/05/15 1103 04/05/15 2300 04/07/15 0400  Weight: 44.906 kg (99 lb) 50.1 kg (110 lb 7.2 oz) 51  kg (112 lb 7 oz)     Microbiology: No results found for this or any previous visit (from the past 240 hour(s)).     Blood Culture    Component Value Date/Time   SDES BLOOD LEFT ARM 07/30/2013 1155   SPECREQUEST BOTTLES DRAWN AEROBIC AND ANAEROBIC 4CC 07/30/2013 1155   CULT  07/30/2013 1155    NO GROWTH 5 DAYS Performed at [facility]   REPTSTATUS 08/05/2013 FINAL 07/30/2013 1155      Labs: Results for orders placed or performed during the hospital encounter of 04/05/15 (from the past 48 hour(s))  Glucose, capillary     Status: Abnormal   Collection Time: 04/16/15  4:34 PM  Result Value Ref Range   Glucose-Capillary 127 (H) 65 - 99 mg/dL  Glucose, capillary     Status: Abnormal   Collection Time: 04/17/15 12:04 AM  Result Value Ref Range   Glucose-Capillary 126 (H) 65 - 99 mg/dL  Glucose, capillary     Status: Abnormal   Collection Time: 04/17/15  8:03 AM  Result Value Ref Range   Glucose-Capillary 129 (H) 65 - 99 mg/dL  Glucose, capillary     Status: Abnormal   Collection Time: 04/17/15  5:55 PM  Result Value Ref Range   Glucose-Capillary 100 (H) 65 - 99 mg/dL  Glucose, capillary     Status: None   Collection Time: 04/18/15  7:21 AM  Result Value Ref Range   Glucose-Capillary 89 65 - 99 mg/dL     Lipid Panel     Component Value Date/Time   TRIG 35 04/15/2015 0600     Lab Results  Component Value Date   HGBA1C * 11/11/2009    6.4 (NOTE) The ADA recommends the following therapeutic goal for glycemic  control related to Hgb A1c measurement: Goal of therapy: <6.5 Hgb A1c  Reference: American Diabetes Association: Clinical Practice Recommendations 2010, Diabetes Care, 2010, 33: (Suppl  1).     Lab Results  Component Value Date   CREATININE <0.30* 04/15/2015     HPI : 81 year old elderly woman presented with feculent vomiting and abdominal pain. Workup negative for fracture or head bleed.  Abdominal pain worsened in the last 24 hrs on the day of admission.Intractable  Nausea and vomiting. Had a horrible night. Came to emergency room and  CT scan concerning  for incarcerated obturator hernia. Surgical consultation was requested.   Patient confused with nasogastric tube in place. Feculent drainage at the time of admission. Moderately distended. Some discomfort but would no  frank peritonitis.   Considered moderate high risk. Family feels patient had good independent quality of life and  Wished  to be aggressive with her care . She has had prior abdominal surgeries. Had rectal prolapse. Underwent laparoscopically assisted sigmoid colectomy and rectopexy in 2010. Laparoscopic cholecystectomy in 2002.   HOSPITAL COURSE:Obturator hernia with obstruction, SBO (small bowel obstruction),Small bowel necrosis, S/p EXPLORATORY LAPAROTOMY, SMALL BOWEL RESECTION, REPAIR OF INCARCERATED HERNIA, 04/05/15, Dr. Johna Sheriff, [facility] [place] T now POD 13   Continues to have Post operative delirium- patient continues to be confused and nonverbal postop. CT head was negative for stroke. Patient is not agitated this time. Acute respiratory failure- resolved, patient was started on Zosyn and then later switched to ceftriaxone completed 7 days of therapy for possible aspiration pneumonitis. Hypokalemia- potassium replaced Atrial fibrillation with RVR - patient currently on Cardizem drip. Heart rate controlled. Malnutrition- started TNA via picc line., now weaned off  Anemia- post op, hemoglobin is stable  at  10.1  Goals of care DNR/DNI Patient will be discharged to hospice home   Roxanol and Ativan for shortness of breath and comfort Swallowing concerns secondary to mental status, but can start comfort diet if family desires   Discharge Exam:    Blood pressure 99/66, pulse 76, temperature 97.9 F (36.6 C), temperature source Axillary, resp. rate 24, height 5\' 5"  (1.651 m), weight 51 kg (112 lb 7 oz), SpO2 98 %.   General: Appears in no acute distress, confused  Cardiovascular: S1-S2 is regular  Respiratory: Clear to auscultation bilaterally  Abdomen: Soft, nontender, no organomegaly  Musculoskeletal: No cyanosis/clubbing/edema of the lower extremities     Discharge Instructions    Diet - low sodium heart healthy    Complete by:  As directed      Diet - low sodium heart healthy    Complete by:  As directed      Increase activity slowly    Complete by:  As directed      Increase activity slowly    Complete by:  As directed            Follow-up Information    Follow up with Pearson Grippe, MD. Schedule an appointment as soon as possible for a visit in 3 days.   Specialty:  Internal Medicine   Contact information:   [address] [place] [address] [phone]       Signed: Richarda Overlie 04/18/2015, 8:52 AM        Time spent >45 mins

## 2015-04-18 NOTE — Care Management Note (Signed)
Case Management Note  Patient Details  Name: Emily Wyatt MRN: [id number] Date of Birth: 21-Jan-1927  Subjective/Objective:                    Action/Plan:d/c residential hospice facility.   Expected Discharge Date:   (UNKNOWN)               Expected Discharge Plan:  Hospice Medical Facility  In-House Referral:  Clinical Social Work  Discharge planning Services  CM Consult  Post Acute Care Choice:  NA Choice offered to:  NA  DME Arranged:    DME Agency:     HH Arranged:    HH Agency:     Status of Service:  Completed, signed off  Medicare Important Message Given:  Yes-second notification given Date Medicare IM Given:    Medicare IM give by:    Date Additional Medicare IM Given:    Additional Medicare Important Message give by:     If discussed at Long Length of Stay Meetings, dates discussed:    Additional Comments:  Lanier Clam, RN 04/18/2015, 10:55 AM

## 2015-04-18 NOTE — Progress Notes (Signed)
Patient is set to discharge to [facility] today. Patient & daughter, Bonita Quin aware. Discharge packet given to RN, Susie. Lafonda Mosses at [facility] to call CSW when ready for [facility] to be called for transport.     Lincoln Maxin, LCSW [facility] Clinical Social Worker cell #: [phone]

## 2015-04-18 NOTE — Care Management Important Message (Signed)
Important Message  Patient Details  Name: Emily Wyatt MRN: [id number] Date of Birth: 16-Jul-1927   Medicare Important Message Given:  Yes-third notification given    Haskell Flirt 04/18/2015, 1:17 PMImportant Message  Patient Details  Name: Emily Wyatt MRN: [id number] Date of Birth: [birth year]   Medicare Important Message Given:  Yes-third notification given    Haskell Flirt 04/18/2015, 1:17 PM

## 2015-04-18 NOTE — Progress Notes (Signed)
[facility] called for transport.     Aliscia Clayton, LCSW [facility] Clinical Social Worker cell #: [phone]  

## 2015-04-18 NOTE — Progress Notes (Signed)
13 Days Post-Op  Subjective: She has her eyes open and is making some sounds this AM.  Not really aware of her surroundings. She has BS, and abdominal incision looks fine.  Staples still in No redness around staples.  Objective: Vital signs in last 24 hours: Temp:  [97.9 F (36.6 C)-98.2 F (36.8 C)] 97.9 F (36.6 C) (09/15 0447) Pulse Rate:  [76-100] 76 (09/15 0447) Resp:  [22-24] 24 (09/15 0447) BP: (99-126)/(48-75) 99/66 mmHg (09/15 0447) SpO2:  [98 %-99 %] 98 % (09/15 0447) Last BM Date: 04/16/15  Intake/Output from previous day: 09/14 0701 - 09/15 0700 In: 0  Out: 885 [Urine:885] Intake/Output this shift:    General appearance: awake and looking around this AM, she is not responding to questions. GI: soft, non-tender; bowel sounds normal; no masses,  no organomegaly and staple line looks good, no erythema.    Lab Results:  No results for input(s): WBC, HGB, HCT, PLT in the last 72 hours.  BMET No results for input(s): NA, K, CL, CO2, GLUCOSE, BUN, CREATININE, CALCIUM in the last 72 hours. PT/INR No results for input(s): LABPROT, INR in the last 72 hours.   Recent Labs Lab 04/15/15 0600  AST 29  ALT 27  ALKPHOS 80  BILITOT 0.6  PROT 5.9*  ALBUMIN 2.9*     Lipase     Component Value Date/Time   LIPASE 22 11/10/2009 2225     Studies/Results: No results found.  Medications: . antiseptic oral rinse  7 mL Mouth Rinse QID  . chlorhexidine gluconate  15 mL Mouth Rinse BID  . sodium chloride  10-40 mL Intracatheter Q12H  . sodium chloride  3 mL Intravenous Q12H     Assessment/Plan Obturator hernia with obstruction, SBO (small bowel obstruction),Small bowel necrosis S/p EXPLORATORY LAPAROTOMY, SMALL BOWEL RESECTION, REPAIR OF INCARCERATED HERNIA, 04/05/15, Dr. Glenna Fellows T POD 13 Acute respiratory issues resolved  Post op delirium/unresponsive Swallowing concerns secondary to mental status Now DNR/DNI with plans to go to Hospice when bed is  available AF not on anticoagulation; currently on Cardizem drip Anemia Hx of GI bleed 2 years ago Hx of rectal prolapase IBS, Hx of diverticulosis Hx of anxiety and depression DVT: I have added SCD's   Plan:  Awaiting placement in [place] place, IV fluids still in place at [facility].    LOS: 13 days    Joseangel Nettleton 04/18/2015

## 2015-05-04 DEATH — deceased

## 2016-01-03 IMAGING — CT CT HEAD W/O CM
1 series · 16 of 30 positions shown, 20 images · non-contrast
Comparison: 09/26/2004.

CLINICAL DATA: Behavioral changes. Confusion post extubation.
Recent abdominal surgery.

EXAM:
CT HEAD WITHOUT CONTRAST
TECHNIQUE: Contiguous axial images were obtained from the base of the skull
through the vertex without intravenous contrast.

[Series 2: headseq 4.8 h45s · axial · 0.43mm/px · z∈[-160,-29]mm · 16 of 30 slices shown, 20 images]
[im 2/30  brain]
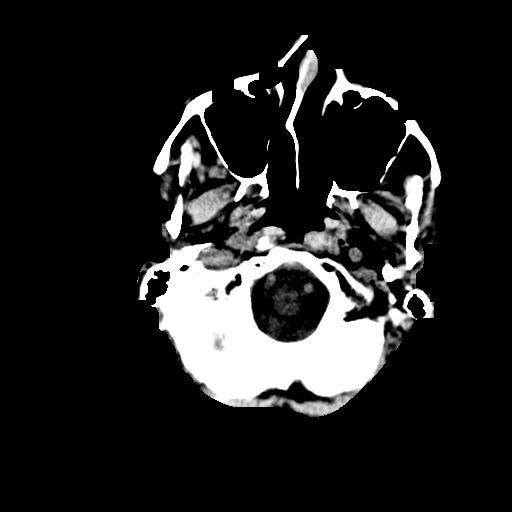
[im 2/30  bone]
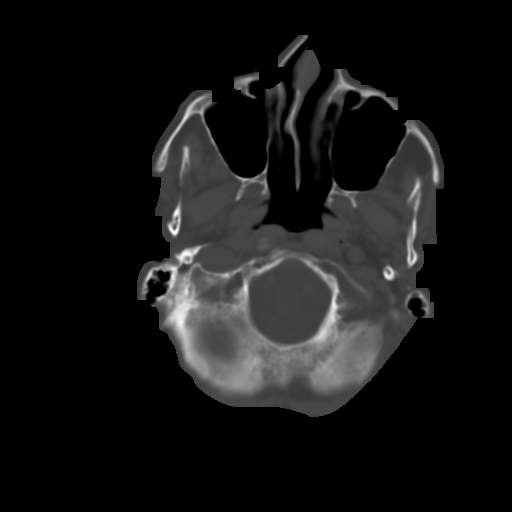
[im 4/30  brain]
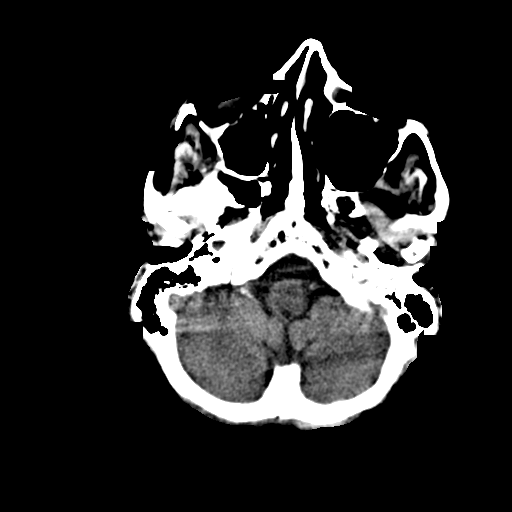
[im 6/30  brain]
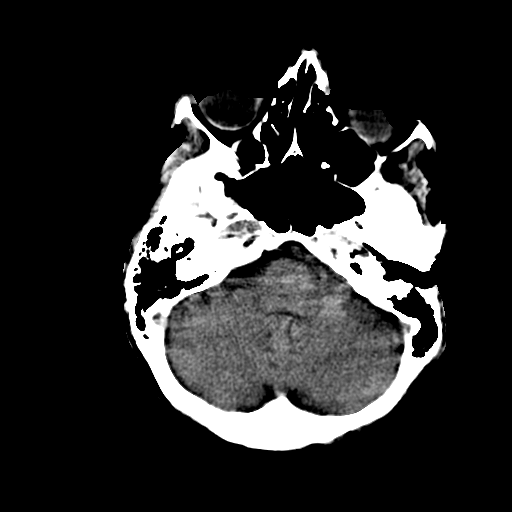
[im 8/30  brain]
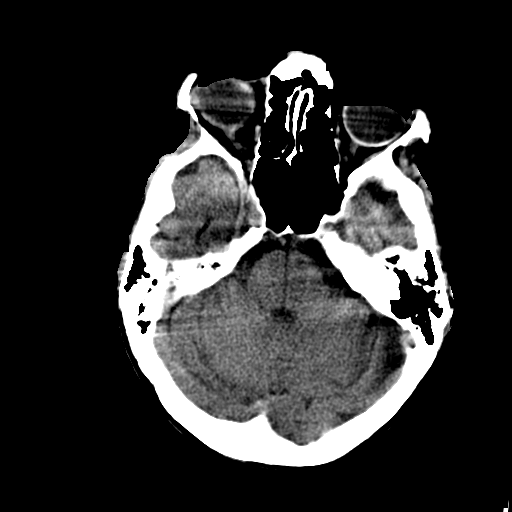
[im 9/30  brain]
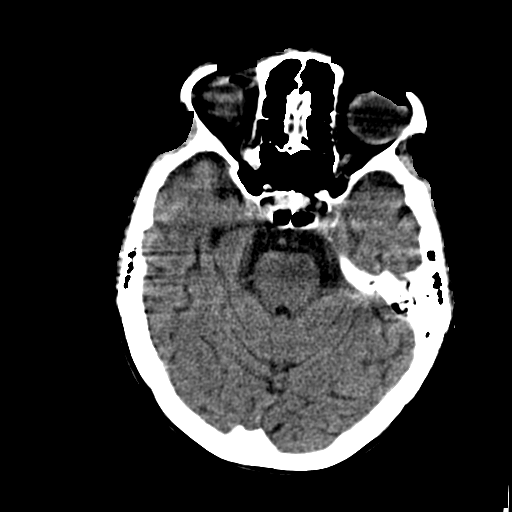
[im 9/30  bone]
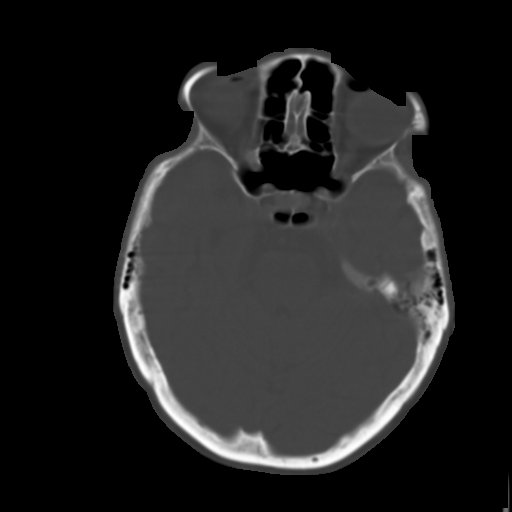
[im 11/30  brain]
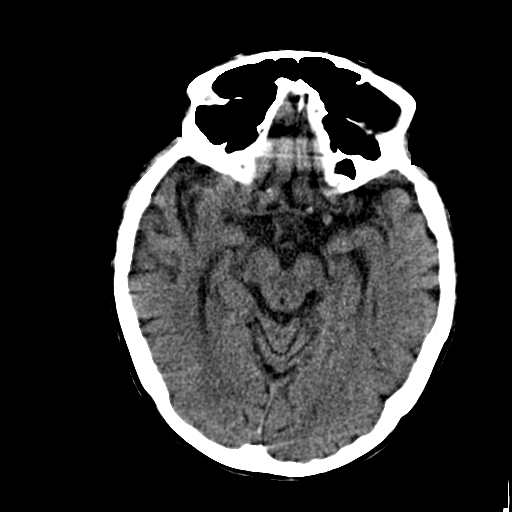
[im 13/30  brain]
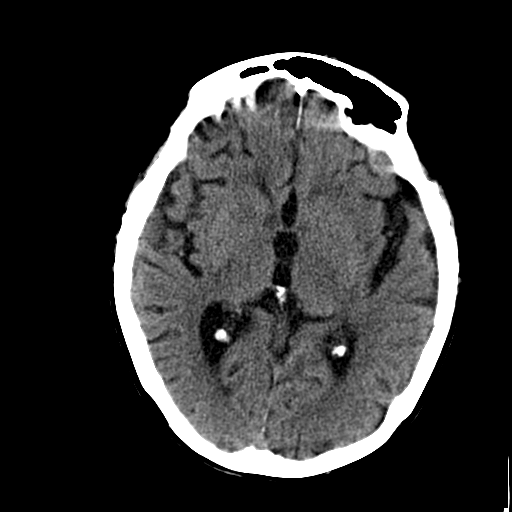
[im 15/30  brain]
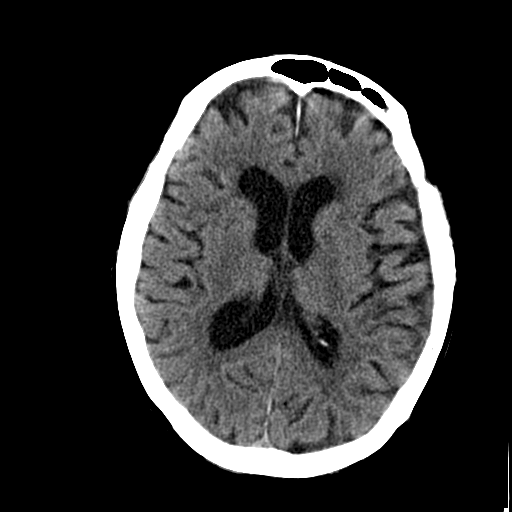
[im 16/30  brain]
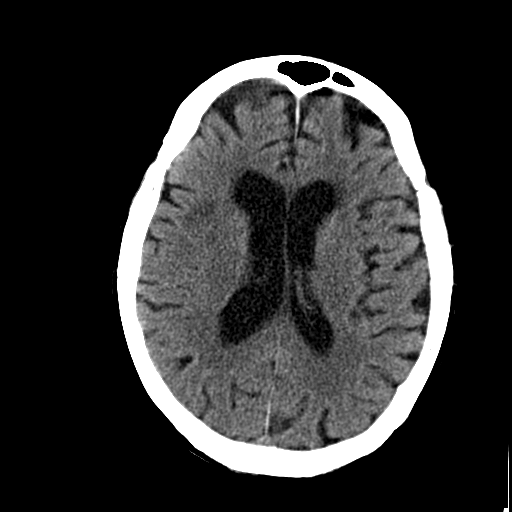
[im 16/30  bone]
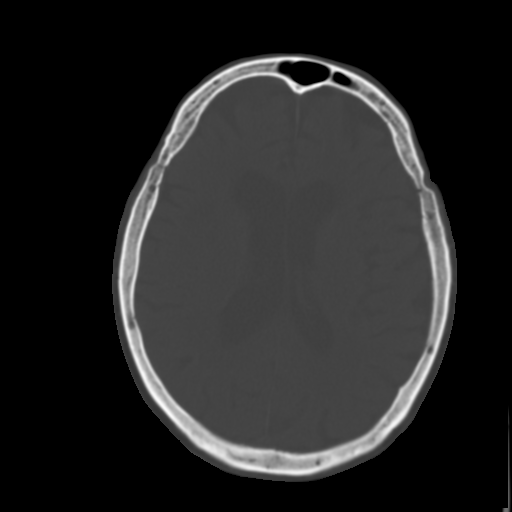
[im 18/30  brain]
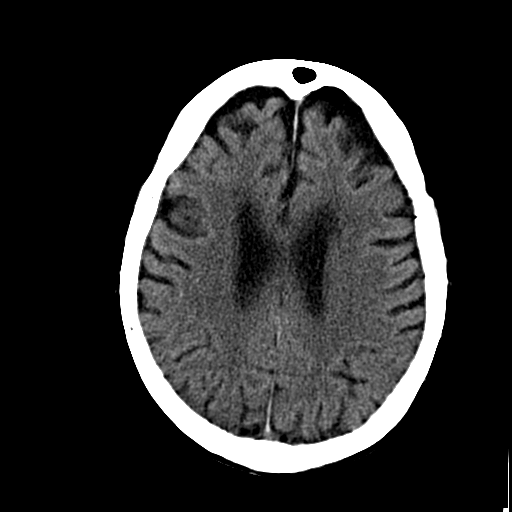
[im 20/30  brain]
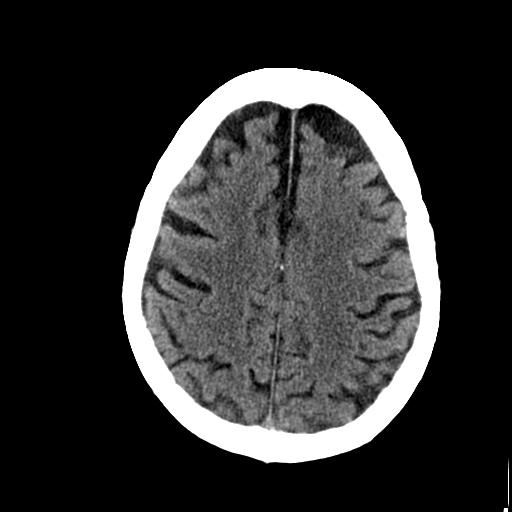
[im 22/30  brain]
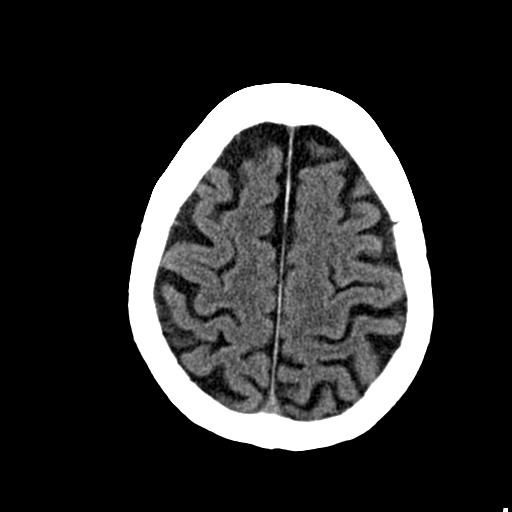
[im 23/30  brain]
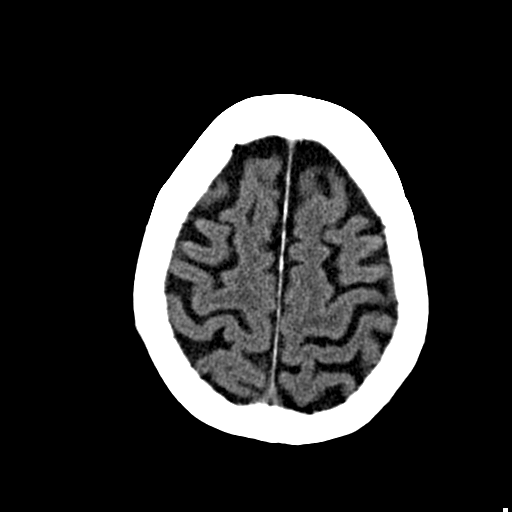
[im 23/30  bone]
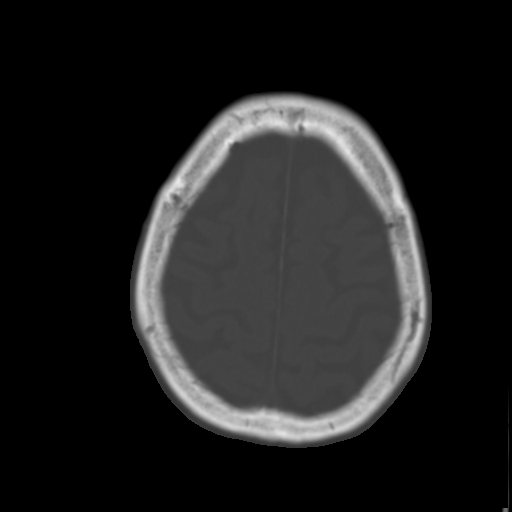
[im 25/30  brain]
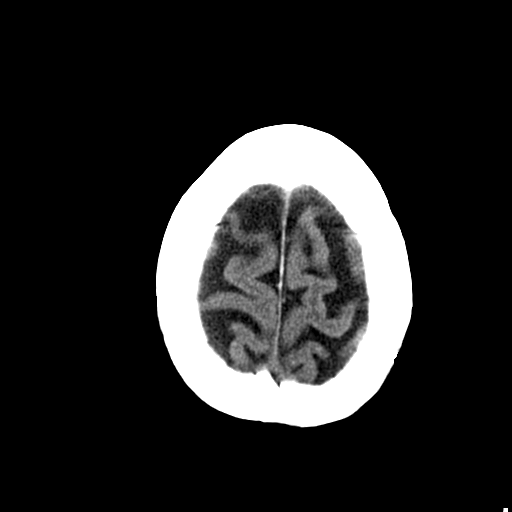
[im 27/30  brain]
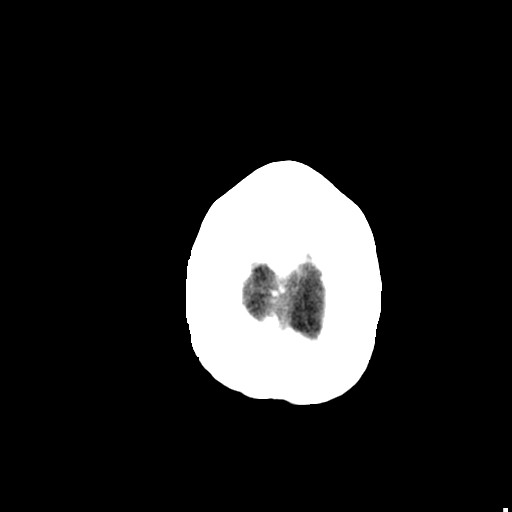
[im 29/30  brain]
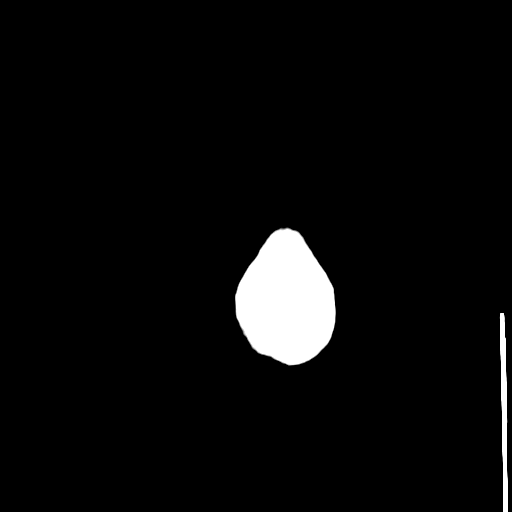

[16 of 30 positions shown; findings below may reference images not displayed]

FINDINGS: No evidence for acute infarction, hemorrhage, mass lesion,
hydrocephalus, or extra-axial fluid. Moderate atrophy. Small vessel
disease. Calvarium intact. BILATERAL ocular surgery. Vascular
calcification. No sinus or mastoid disease. Similar appearance to
priors.
IMPRESSION: Stable chronic changes as described. No acute intracranial findings.
